# Patient Record
Sex: Female | Born: 1945
Health system: Southern US, Community
[De-identification: ages and names within clinical notes are randomized; demographics above are authoritative.]

## PROBLEM LIST (undated history)

## (undated) DIAGNOSIS — I10 Essential (primary) hypertension: Secondary | ICD-10-CM

## (undated) DIAGNOSIS — K5790 Diverticulosis of intestine, part unspecified, without perforation or abscess without bleeding: Secondary | ICD-10-CM

## (undated) DIAGNOSIS — D126 Benign neoplasm of colon, unspecified: Secondary | ICD-10-CM

## (undated) DIAGNOSIS — R011 Cardiac murmur, unspecified: Secondary | ICD-10-CM

## (undated) DIAGNOSIS — K635 Polyp of colon: Secondary | ICD-10-CM

## (undated) DIAGNOSIS — B9681 Helicobacter pylori [H. pylori] as the cause of diseases classified elsewhere: Secondary | ICD-10-CM

## (undated) DIAGNOSIS — K449 Diaphragmatic hernia without obstruction or gangrene: Secondary | ICD-10-CM

## (undated) DIAGNOSIS — R000 Tachycardia, unspecified: Secondary | ICD-10-CM

## (undated) DIAGNOSIS — E859 Amyloidosis, unspecified: Secondary | ICD-10-CM

## (undated) DIAGNOSIS — E079 Disorder of thyroid, unspecified: Secondary | ICD-10-CM

## (undated) DIAGNOSIS — H269 Unspecified cataract: Secondary | ICD-10-CM

## (undated) DIAGNOSIS — H9190 Unspecified hearing loss, unspecified ear: Secondary | ICD-10-CM

## (undated) DIAGNOSIS — M1990 Unspecified osteoarthritis, unspecified site: Secondary | ICD-10-CM

## (undated) DIAGNOSIS — E785 Hyperlipidemia, unspecified: Secondary | ICD-10-CM

## (undated) DIAGNOSIS — K219 Gastro-esophageal reflux disease without esophagitis: Secondary | ICD-10-CM

## (undated) DIAGNOSIS — E039 Hypothyroidism, unspecified: Secondary | ICD-10-CM

## (undated) DIAGNOSIS — E119 Type 2 diabetes mellitus without complications: Secondary | ICD-10-CM

## (undated) DIAGNOSIS — K8020 Calculus of gallbladder without cholecystitis without obstruction: Secondary | ICD-10-CM

## (undated) HISTORY — DX: Essential (primary) hypertension: I10

## (undated) HISTORY — DX: Unspecified cataract: H26.9

## (undated) HISTORY — DX: Hyperlipidemia, unspecified: E78.5

## (undated) HISTORY — DX: Diaphragmatic hernia without obstruction or gangrene: K44.9

## (undated) HISTORY — DX: Type 2 diabetes mellitus without complications: E11.9

## (undated) HISTORY — DX: Gastro-esophageal reflux disease without esophagitis: K21.9

## (undated) HISTORY — DX: Hypothyroidism, unspecified: E03.9

## (undated) HISTORY — PX: APPENDECTOMY: SHX54

## (undated) HISTORY — DX: Benign neoplasm of colon, unspecified: D12.6

## (undated) HISTORY — DX: Unspecified osteoarthritis, unspecified site: M19.90

## (undated) HISTORY — DX: Helicobacter pylori (H. pylori) as the cause of diseases classified elsewhere: B96.81

## (undated) HISTORY — DX: Tachycardia, unspecified: R00.0

## (undated) HISTORY — DX: Cardiac murmur, unspecified: R01.1

## (undated) HISTORY — DX: Calculus of gallbladder without cholecystitis without obstruction: K80.20

## (undated) HISTORY — DX: Disorder of thyroid, unspecified: E07.9

## (undated) HISTORY — PX: TONSILLECTOMY: SUR1361

## (undated) HISTORY — DX: Unspecified hearing loss, unspecified ear: H91.90

## (undated) HISTORY — DX: Diverticulosis of intestine, part unspecified, without perforation or abscess without bleeding: K57.90

## (undated) HISTORY — PX: CHOLECYSTECTOMY: SHX55

## (undated) HISTORY — DX: Polyp of colon: K63.5

## (undated) HISTORY — DX: Amyloidosis, unspecified: E85.9

## (undated) HISTORY — PX: ABDOMINAL HYSTERECTOMY: SHX81

## (undated) HISTORY — PX: COLONOSCOPY: SHX174

---

## 1998-10-31 ENCOUNTER — Ambulatory Visit (HOSPITAL_COMMUNITY): Admission: RE | Admit: 1998-10-31 | Discharge: 1998-10-31 | Payer: Self-pay | Admitting: Otolaryngology

## 1998-10-31 ENCOUNTER — Encounter: Payer: Self-pay | Admitting: Otolaryngology

## 1998-12-29 ENCOUNTER — Encounter (HOSPITAL_COMMUNITY): Admission: RE | Admit: 1998-12-29 | Discharge: 1999-03-29 | Payer: Self-pay | Admitting: Otolaryngology

## 1999-01-02 ENCOUNTER — Emergency Department (HOSPITAL_COMMUNITY): Admission: EM | Admit: 1999-01-02 | Discharge: 1999-01-02 | Payer: Self-pay | Admitting: Emergency Medicine

## 1999-03-19 ENCOUNTER — Ambulatory Visit (HOSPITAL_COMMUNITY): Admission: RE | Admit: 1999-03-19 | Discharge: 1999-03-19 | Payer: Self-pay | Admitting: Neurology

## 1999-03-19 ENCOUNTER — Encounter: Payer: Self-pay | Admitting: Neurology

## 2002-04-30 ENCOUNTER — Encounter: Admission: RE | Admit: 2002-04-30 | Discharge: 2002-06-26 | Payer: Self-pay | Admitting: Endocrinology

## 2004-07-28 ENCOUNTER — Encounter (INDEPENDENT_AMBULATORY_CARE_PROVIDER_SITE_OTHER): Payer: Self-pay | Admitting: *Deleted

## 2004-07-28 ENCOUNTER — Inpatient Hospital Stay (HOSPITAL_COMMUNITY): Admission: RE | Admit: 2004-07-28 | Discharge: 2004-07-30 | Payer: Self-pay | Admitting: Obstetrics and Gynecology

## 2004-07-28 ENCOUNTER — Encounter (INDEPENDENT_AMBULATORY_CARE_PROVIDER_SITE_OTHER): Payer: Self-pay | Admitting: Specialist

## 2006-06-23 ENCOUNTER — Encounter: Admission: RE | Admit: 2006-06-23 | Discharge: 2006-06-23 | Payer: Self-pay | Admitting: Obstetrics and Gynecology

## 2006-08-02 ENCOUNTER — Ambulatory Visit: Payer: Self-pay | Admitting: Internal Medicine

## 2007-06-28 ENCOUNTER — Encounter: Admission: RE | Admit: 2007-06-28 | Discharge: 2007-06-28 | Payer: Self-pay | Admitting: Obstetrics and Gynecology

## 2007-06-30 ENCOUNTER — Encounter: Admission: RE | Admit: 2007-06-30 | Discharge: 2007-06-30 | Payer: Self-pay | Admitting: Obstetrics and Gynecology

## 2008-06-28 ENCOUNTER — Encounter: Admission: RE | Admit: 2008-06-28 | Discharge: 2008-06-28 | Payer: Self-pay | Admitting: Obstetrics and Gynecology

## 2008-12-04 ENCOUNTER — Encounter: Admission: RE | Admit: 2008-12-04 | Discharge: 2008-12-04 | Payer: Self-pay | Admitting: Obstetrics and Gynecology

## 2009-05-22 DIAGNOSIS — E039 Hypothyroidism, unspecified: Secondary | ICD-10-CM | POA: Insufficient documentation

## 2009-05-31 ENCOUNTER — Inpatient Hospital Stay (HOSPITAL_COMMUNITY): Admission: EM | Admit: 2009-05-31 | Discharge: 2009-06-02 | Payer: Self-pay | Admitting: Emergency Medicine

## 2009-06-01 ENCOUNTER — Encounter (INDEPENDENT_AMBULATORY_CARE_PROVIDER_SITE_OTHER): Payer: Self-pay | Admitting: Cardiovascular Disease

## 2009-07-02 ENCOUNTER — Encounter: Admission: RE | Admit: 2009-07-02 | Discharge: 2009-07-02 | Payer: Self-pay | Admitting: Obstetrics and Gynecology

## 2009-08-11 DIAGNOSIS — E559 Vitamin D deficiency, unspecified: Secondary | ICD-10-CM | POA: Insufficient documentation

## 2010-07-07 ENCOUNTER — Encounter: Admission: RE | Admit: 2010-07-07 | Discharge: 2010-07-07 | Payer: Self-pay | Admitting: Obstetrics and Gynecology

## 2010-10-04 ENCOUNTER — Encounter: Payer: Self-pay | Admitting: Obstetrics and Gynecology

## 2010-12-18 LAB — LIPID PANEL
HDL: 62 mg/dL
Total CHOL/HDL Ratio: 3.3 ratio
Triglycerides: 159 mg/dL — ABNORMAL HIGH
VLDL: 32 mg/dL (ref 0–40)

## 2010-12-18 LAB — BASIC METABOLIC PANEL
BUN: 11 mg/dL (ref 6–23)
CO2: 24 mEq/L (ref 19–32)
Chloride: 107 mEq/L (ref 96–112)
GFR calc Af Amer: >60 mL/min (ref >60)
Glucose, Bld: 165 mg/dL — ABNORMAL HIGH (ref 70–99)
Potassium: 3.9 mEq/L (ref 3.5–5.1)

## 2010-12-18 LAB — POCT CARDIAC MARKERS
CKMB, poc: 1.4 ng/mL (ref 1.0–8.0)
Myoglobin, poc: 61 ng/mL (ref 12–200)
Troponin i, poc: <0.05 ng/mL (ref 0.00–0.09)

## 2010-12-18 LAB — COMPREHENSIVE METABOLIC PANEL
AST: 26 U/L (ref 0–37)
Alkaline Phosphatase: 76 U/L (ref 39–117)
Calcium: 8.9 mg/dL (ref 8.4–10.5)
Total Protein: 6.5 g/dL (ref 6.0–8.3)

## 2010-12-18 LAB — CBC
HCT: 35.6 % — ABNORMAL LOW (ref 36.0–46.0)
HCT: 35.7 % — ABNORMAL LOW (ref 36.0–46.0)
HCT: 38.4 % (ref 36.0–46.0)
HCT: 39.4 % (ref 36.0–46.0)
Hemoglobin: 12 g/dL (ref 12.0–15.0)
Hemoglobin: 12.8 g/dL (ref 12.0–15.0)
Hemoglobin: 13.1 g/dL (ref 12.0–15.0)
MCHC: 33.3 g/dL (ref 30.0–36.0)
MCHC: 33.7 g/dL (ref 30.0–36.0)
MCV: 86.1 fL (ref 78.0–100.0)
MCV: 86.4 fL (ref 78.0–100.0)
MCV: 86.7 fL (ref 78.0–100.0)
Platelets: 285 10*3/uL (ref 150–400)
Platelets: 291 10*3/uL (ref 150–400)
Platelets: 310 10*3/uL (ref 150–400)
RBC: 4.13 MIL/uL (ref 3.87–5.11)
RBC: 4.46 MIL/uL (ref 3.87–5.11)
RBC: 4.54 MIL/uL (ref 3.87–5.11)
RDW: 15.4 % (ref 11.5–15.5)
RDW: 15.5 % (ref 11.5–15.5)
RDW: 15.8 % — ABNORMAL HIGH (ref 11.5–15.5)
RDW: 15.9 % — ABNORMAL HIGH (ref 11.5–15.5)
WBC: 11 10*3/uL — ABNORMAL HIGH (ref 4.0–10.5)
WBC: 11.1 10*3/uL — ABNORMAL HIGH (ref 4.0–10.5)

## 2010-12-18 LAB — BRAIN NATRIURETIC PEPTIDE: Pro B Natriuretic peptide (BNP): 38 pg/mL (ref 0.0–100.0)

## 2010-12-18 LAB — DIFFERENTIAL
Eosinophils Absolute: 0.1 10*3/uL (ref 0.0–0.7)
Eosinophils Relative: 1 % (ref 0–5)
Lymphocytes Relative: 31 % (ref 12–46)
Lymphs Abs: 3.4 10*3/uL (ref 0.7–4.0)
Monocytes Absolute: 0.7 10*3/uL (ref 0.1–1.0)
Monocytes Relative: 6 % (ref 3–12)
Neutro Abs: 6.9 10*3/uL (ref 1.7–7.7)
Neutrophils Relative %: 62 % (ref 43–77)

## 2010-12-18 LAB — CARDIAC PANEL(CRET KIN+CKTOT+MB+TROPI)
CK, MB: 2.2 ng/mL (ref 0.3–4.0)
CK, MB: 2.6 ng/mL (ref 0.3–4.0)
Relative Index: 2 (ref 0.0–2.5)
Relative Index: 2.1 (ref 0.0–2.5)
Relative Index: INVALID (ref 0.0–2.5)
Total CK: 117 U/L (ref 7–177)
Total CK: 99 U/L (ref 7–177)

## 2010-12-18 LAB — HEMOGLOBIN A1C: Mean Plasma Glucose: 174 mg/dL

## 2010-12-18 LAB — LIPASE, BLOOD: Lipase: 101 U/L — ABNORMAL HIGH (ref 11–59)

## 2010-12-18 LAB — POCT I-STAT, CHEM 8
Chloride: 106 mEq/L (ref 96–112)
TCO2: 25 mmol/L (ref 0–100)

## 2010-12-18 LAB — GLUCOSE, CAPILLARY
Glucose-Capillary: 134 mg/dL — ABNORMAL HIGH (ref 70–99)
Glucose-Capillary: 170 mg/dL — ABNORMAL HIGH (ref 70–99)

## 2010-12-18 LAB — TSH: TSH: 1.697 u[IU]/mL (ref 0.350–4.500)

## 2010-12-18 LAB — HEPARIN LEVEL (UNFRACTIONATED): Heparin Unfractionated: 0.29 IU/mL — ABNORMAL LOW (ref 0.30–0.70)

## 2010-12-18 LAB — PROTIME-INR: INR: 0.9 (ref 0.00–1.49)

## 2011-01-29 NOTE — Assessment & Plan Note (Signed)
[facility]                           GASTROENTEROLOGY OFFICE NOTE   KARLY, PITTER                         MRN:          [id number]  DATE:08/02/2006                            DOB:          [birth year]    Miss Deschepper is an 65 year old white female who has new onset constipation  which started before she went on Weight Watchers diet where she lost at 50  pounds. She continues to lose weight and continues to be constipated. We saw  her in June 2005, for screening colonoscopy. At that time, her examination  was normal. She had fatty ileocecal valve which was biopsied and was normal.  Next colonoscopy was scheduled for 10 years from 2005. She now is  constipated, having to take stool softener 1 a day which causes her to have  1 bowel movement a day. She is also on a high fiber diet and fiber  supplements. She denies any abdominal pain. She had some difficulty with  evacuation of the stool and actually her tailbone was hurting recently from  sitting at the bedside while her son was having surgery at [facility].   PHYSICAL EXAMINATION:  Blood pressure is 120/62, pulse is 82 and weight is  232 pounds. Prior weight was 270 pounds. She was alert, oriented, in no  distress.  ABDOMEN: Showed skin furuncle on right lower quadrant. Soft, nontender with  normoactive bowel sounds.  RECTAL: There was small external hemorrhoids. Normal anal canal with normal  rectal tone. Moderate amount of __________ . Hemoccult negative stool in the  ampulla. There was no evidence of fissure.   IMPRESSION:  31. A  65 year old white female with functional constipation due to weight      loss and dieting. She had a normal colonoscopy a year and a half ago.      No need to repeat the colon examination.  2. Small external hemorrhoids aggravated by the patient straining.   PLAN:  1. Continue stool softener 1 a day.  2. As an alternative, we discussed use of prunes 2-3 a day or  prune juice.      The patient liked the idea of the prunes because they are natural      laxatives.  3. Prescription for MiraLax given as a last resort 17 grams several times      a week as needed for constipation. I encouraged her to continue to eat      the high fiber diet.     Hedwig Morton. Juanda Chance, MD  Electronically Signed    DMB/MedQ  DD: 08/02/2006  DT: 08/02/2006  Job #: [id number]   cc:   Tera Mater. Evlyn Kanner, M.D.

## 2011-01-29 NOTE — Op Note (Signed)
Pamela [name], Pamela [name]                  ACCOUNT NO.:  [id number]   MEDICAL RECORD NO.:  [id number]          PATIENT TYPE:  INP   LOCATION:  0007                         FACILITY:  [facility]   PHYSICIAN:  Katherine Roan, M.D.  DATE OF BIRTH:  1945/11/07   DATE OF PROCEDURE:  07/28/2004  DATE OF DISCHARGE:                                 OPERATIVE REPORT   SURGEON:  Dr. Elana Alm   ASSISTANT:  Dr. Greta Doom   PREOPERATIVE DIAGNOSIS:  Pelvic mass, status post hysterectomy.   POSTOPERATIVE DIAGNOSIS:  Pelvic mass, probably benign.   OPERATIONS PERFORMED:  1.  Pelvic exam under anesthesia.  2.  Exploratory laparotomy.  3.  Lysis of adhesions.  4.  Excision of pelvic and paravaginal cysts.  5.  Vaginotomy and closure.  6.  Ureterolysis.   The patient was placed in lithotomy position.  Examination under anesthesia  confirmed the right-sided pelvic mass.  It was just beside the vagina.  Foley catheter was inserted, and the midline was opened from the previous  surgery.  Hemostasis was accomplished with a Bovie.  The fascia was entered  vertically.  Peritoneum entered vertically, and adhesions from the omentum  to the lower abdominal wall were lysed with sharp dissection.  We then  placed her in a deep Trendelenburg position and were able to free up the  adhesions in the pelvis and free up the pelvic mass which was unattached to  the infundibulopelvic ligament, and it was beside the vagina.  Careful  dissection right on the cyst wall was done.  We actually got in the cyst  wall.  The fluid was clear.  It was a unilocular cyst.  We then freed up the  rest of the cyst using hemoclips for hemostasis.  Following this, we were  able to obtain hemostasis.  In getting into the cyst, we did get into the  vagina.  The vaginotomy was then closed with interrupted sutures of 0 PDS,  and I was able to fashion a closure of the vagina that I thought would help  prevent an enterocele.  Hemostasis was secure  except in the right angle of  the vagina, and we were able to use clips and sutures for arrest of the  bleeding.  We put Surgicel around this area when we finished.  Prior to  placing the Surgicel in the pelvis, I opened up the peritoneum and  visualized the ureter.  It was not dilated, and we traced it down to where  we had done the surgery and felt like it was out of harm's way.  The colon  was likewise free from any serosal injury.  Following this, we irrigated  copiously then placed the Surgicel in and then closed the midline peritoneum  with 2-0 PDS, following which we closed the fascia with interrupted sutures  figure-of-eight of #1 Novofil.  Eight knots were placed on the Novofil for  hemostasis.  The subcutaneum was then closed with 2-0 plain continuous suture, and there  were three nylon sutures placed in the skin, and the skin was closed with  clips in addition to the three nylon sutures.  I then infiltrated the  incision with 0.5% Marcaine with epinephrine.  Following this, she was taken  to the recovery room.      SDM/MEDQ  D:  07/28/2004  T:  07/28/2004  Job:  [id number]   cc:   Luvenia Redden, M.D.  [address]  [place]  [place] [address]  Fax: [phone]

## 2011-01-29 NOTE — H&P (Signed)
[name], [name]                  ACCOUNT NO.:  [id number]   MEDICAL RECORD NO.:  [id number]          PATIENT TYPE:  INP   LOCATION:  NA                           FACILITY:  [facility]   PHYSICIAN:  Katherine Roan, M.D.  DATE OF BIRTH:  20-Jul-1946   DATE OF ADMISSION:  07/28/2004  DATE OF DISCHARGE:                                HISTORY & PHYSICAL   CHIEF COMPLAINT:  Pelvic mass.   This is a 65 year old gravida 1 female status post TAH/BSO with a cystic  mass beside the right vaginal fornix.  She is asymptomatic.  Her medication  comorbidities include hypothyroidism, elevated cholesterol, diabetes, and  obesity.  She has been seen by Dr. Ardyth Harps.  Dr. Lina Sar did a  colonoscopy on her in June.  Her CA-125 is normal.   She is allergic to SULFA, DEMEROL, and MECLIZINE.   She has a history of gallbladder surgery, hysterectomy in 1996.  Records  have been reviewed at that time, which revealed both tubes and ovaries have  been removed.  The right ovary had a large dermoid on it.  This mass is on  the right side of the pelvis.  It appears to be a simple cyst.   REVIEW OF SYSTEMS:  She wear glasses for reading.  She has no decrease in  visual or auditory acuity.  No dizziness.  She is hypertensive, and this is  controlled with lisinopril.  She has no history of dizziness.  No history of  heart murmur.  No chest pain.  LUNGS:  No chronic cough.  GU:  She has no  stress incontinence.  GI:  She has had a history of irritable bowel syndrome  and recently saw Dr. Juanda Chance, for which she gave some hyoscyamine.  She has  no history of arthritis.   FAMILY HISTORY:  Her mother is deceased at age 20 of a cerebral aneurysm.  Her father is deceased.  Was diabetic and had Alzheimer's.  She has one  brother with hypertension.  She has a maternal grandmother with breast  cancer, and a paternal uncle with lung cancer.  Her father, maternal aunt,  and paternal uncle were all diabetic.   PHYSICAL  EXAMINATION:  VITAL SIGNS:  Weight 257.  Blood pressure 130/70.  GENERAL:  A pleasant, well-developed, well-nourished female who appears to  be her stated age.  She is oriented to time, place, and recent events.  HEENT:  Examination of the ears, nose, and throat are unremarkable.  Oropharynx is not injected.  NECK:  Supple.  Carotid pulses are equal without bruits.  No adenopathy  appreciated.  LUNGS:  Clear.  BREASTS:  No masses or tenderness.  HEART:  Normal sinus rhythm.  No murmurs.  ABDOMEN:  Obese.  Liver, spleen, and kidneys are not palpated.  EXTREMITIES:  Trace edema.  Good range of motion.  PELVIC:  A cystic mass on the right side of the vagina.  It appears to be a  simple cyst.  It is nonmoveable.  Rectovaginal confirms.  Hemoccult  negative.   IMPRESSION:  Probable  inclusion cyst in the vagina, rule out ovarian  remnant, which I doubt.   We have discussed the risks and benefits with Ms. Kulig, and she has been  appraised that the risks could be infection, hemorrhage, damage to bowel or  bladder.  She is on a bowel prep.      SDM/MEDQ  D:  07/23/2004  T:  07/23/2004  Job:  [id number]

## 2011-01-29 NOTE — Discharge Summary (Signed)
Pamela [name], Pamela [name]                  ACCOUNT NO.:  [id number]   MEDICAL RECORD NO.:  [id number]          PATIENT TYPE:  INP   LOCATION:  0440                         FACILITY:  [facility]   PHYSICIAN:  Katherine Roan, M.D.  DATE OF BIRTH:  07-09-1946   DATE OF ADMISSION:  07/28/2004  DATE OF DISCHARGE:  07/30/2004                                 DISCHARGE SUMMARY   ADMISSION DIAGNOSIS:  Pelvic mass.   DISCHARGE DIAGNOSES:  1.  Pelvic mass.  2.  Type 2 diabetes.  3.  Hypertension.  4.  History of hypothyroidism.  5.  Obesity.   HISTORY OF PRESENT ILLNESS:  Ms. Kolodziej is a 65 year old female who was  admitted to the hospital for a pelvic mass.  She is status post TAH/BSO.  The cystic mass was felt to be complex and was paravaginal.   LABORATORY STUDIES:  Admission hemoglobin was 13.  Urinalysis was  unremarkable.  Glucose was 161.  Creatinine was 0.6.   Cardiogram was normal.   HOSPITAL COURSE:  The patient was admitted to the hospital and underwent an  exploratory laparotomy with lysis of adhesions and excision of a paravaginal  mass.  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  At any rate, the surgery was without  incidence and she was discharged on the 17th of November, doing very well;  she was tolerating a diet and had some vaginal bleeding.  We discussed with  her prior to discharge that the diagnosis of a paravaginal cyst was benign.   FOLLOWUP:  She is to return to the office in approximately 3 days to have  her staples removed and call as needed for bleeding.   CONDITION ON DISCHARGE:  Condition on discharge is improved.      SDM/MEDQ  D:  08/20/2004  T:  08/21/2004  Job:  [id number]   cc:   Barry Dienes. Eloise Harman, M.D.  [address]  [place]  [place] [address]  Fax: [phone]

## 2011-06-07 ENCOUNTER — Other Ambulatory Visit: Payer: Self-pay | Admitting: Obstetrics and Gynecology

## 2011-06-07 DIAGNOSIS — Z1231 Encounter for screening mammogram for malignant neoplasm of breast: Secondary | ICD-10-CM

## 2011-06-08 ENCOUNTER — Other Ambulatory Visit: Payer: Self-pay | Admitting: Obstetrics and Gynecology

## 2011-06-08 DIAGNOSIS — Z780 Asymptomatic menopausal state: Secondary | ICD-10-CM

## 2011-07-09 ENCOUNTER — Ambulatory Visit
Admission: RE | Admit: 2011-07-09 | Discharge: 2011-07-09 | Disposition: A | Discharge status: Home / Self Care | Payer: Medicare Other | Source: Ambulatory Visit | Attending: Obstetrics and Gynecology | Admitting: Obstetrics and Gynecology

## 2011-07-09 DIAGNOSIS — Z780 Asymptomatic menopausal state: Secondary | ICD-10-CM

## 2011-07-09 DIAGNOSIS — Z1231 Encounter for screening mammogram for malignant neoplasm of breast: Secondary | ICD-10-CM

## 2011-10-26 DIAGNOSIS — E039 Hypothyroidism, unspecified: Secondary | ICD-10-CM | POA: Diagnosis not present

## 2011-10-26 DIAGNOSIS — E559 Vitamin D deficiency, unspecified: Secondary | ICD-10-CM | POA: Diagnosis not present

## 2011-10-26 DIAGNOSIS — I10 Essential (primary) hypertension: Secondary | ICD-10-CM | POA: Diagnosis not present

## 2011-10-26 DIAGNOSIS — H8110 Benign paroxysmal vertigo, unspecified ear: Secondary | ICD-10-CM | POA: Insufficient documentation

## 2011-10-27 DIAGNOSIS — I69998 Other sequelae following unspecified cerebrovascular disease: Secondary | ICD-10-CM | POA: Diagnosis not present

## 2011-10-27 DIAGNOSIS — R42 Dizziness and giddiness: Secondary | ICD-10-CM | POA: Diagnosis not present

## 2011-11-02 DIAGNOSIS — R42 Dizziness and giddiness: Secondary | ICD-10-CM | POA: Diagnosis not present

## 2011-11-02 DIAGNOSIS — I69998 Other sequelae following unspecified cerebrovascular disease: Secondary | ICD-10-CM | POA: Diagnosis not present

## 2011-11-05 DIAGNOSIS — I69998 Other sequelae following unspecified cerebrovascular disease: Secondary | ICD-10-CM | POA: Diagnosis not present

## 2011-11-10 DIAGNOSIS — I69998 Other sequelae following unspecified cerebrovascular disease: Secondary | ICD-10-CM | POA: Diagnosis not present

## 2011-11-10 DIAGNOSIS — R42 Dizziness and giddiness: Secondary | ICD-10-CM | POA: Diagnosis not present

## 2011-12-03 DIAGNOSIS — R42 Dizziness and giddiness: Secondary | ICD-10-CM | POA: Diagnosis not present

## 2011-12-03 DIAGNOSIS — I69998 Other sequelae following unspecified cerebrovascular disease: Secondary | ICD-10-CM | POA: Diagnosis not present

## 2011-12-24 DIAGNOSIS — R42 Dizziness and giddiness: Secondary | ICD-10-CM | POA: Diagnosis not present

## 2011-12-24 DIAGNOSIS — E785 Hyperlipidemia, unspecified: Secondary | ICD-10-CM | POA: Diagnosis not present

## 2011-12-24 DIAGNOSIS — E559 Vitamin D deficiency, unspecified: Secondary | ICD-10-CM | POA: Diagnosis not present

## 2011-12-24 DIAGNOSIS — E119 Type 2 diabetes mellitus without complications: Secondary | ICD-10-CM | POA: Diagnosis not present

## 2012-01-06 DIAGNOSIS — I69998 Other sequelae following unspecified cerebrovascular disease: Secondary | ICD-10-CM | POA: Diagnosis not present

## 2012-01-06 DIAGNOSIS — R42 Dizziness and giddiness: Secondary | ICD-10-CM | POA: Diagnosis not present

## 2012-01-31 DIAGNOSIS — R42 Dizziness and giddiness: Secondary | ICD-10-CM | POA: Diagnosis not present

## 2012-01-31 DIAGNOSIS — E782 Mixed hyperlipidemia: Secondary | ICD-10-CM | POA: Diagnosis not present

## 2012-01-31 DIAGNOSIS — E119 Type 2 diabetes mellitus without complications: Secondary | ICD-10-CM | POA: Diagnosis not present

## 2012-02-02 DIAGNOSIS — H35369 Drusen (degenerative) of macula, unspecified eye: Secondary | ICD-10-CM | POA: Diagnosis not present

## 2012-02-02 DIAGNOSIS — E119 Type 2 diabetes mellitus without complications: Secondary | ICD-10-CM | POA: Diagnosis not present

## 2012-02-02 DIAGNOSIS — H2510 Age-related nuclear cataract, unspecified eye: Secondary | ICD-10-CM | POA: Diagnosis not present

## 2012-02-02 DIAGNOSIS — H538 Other visual disturbances: Secondary | ICD-10-CM | POA: Diagnosis not present

## 2012-02-02 DIAGNOSIS — H02829 Cysts of unspecified eye, unspecified eyelid: Secondary | ICD-10-CM | POA: Diagnosis not present

## 2012-02-02 DIAGNOSIS — H524 Presbyopia: Secondary | ICD-10-CM | POA: Diagnosis not present

## 2012-05-02 DIAGNOSIS — H905 Unspecified sensorineural hearing loss: Secondary | ICD-10-CM | POA: Diagnosis not present

## 2012-05-05 DIAGNOSIS — E559 Vitamin D deficiency, unspecified: Secondary | ICD-10-CM | POA: Diagnosis not present

## 2012-05-05 DIAGNOSIS — I7300 Raynaud's syndrome without gangrene: Secondary | ICD-10-CM | POA: Insufficient documentation

## 2012-05-05 DIAGNOSIS — E119 Type 2 diabetes mellitus without complications: Secondary | ICD-10-CM | POA: Diagnosis not present

## 2012-05-05 DIAGNOSIS — R252 Cramp and spasm: Secondary | ICD-10-CM | POA: Diagnosis not present

## 2012-05-29 ENCOUNTER — Other Ambulatory Visit: Payer: Self-pay | Admitting: Obstetrics and Gynecology

## 2012-05-29 DIAGNOSIS — Z1231 Encounter for screening mammogram for malignant neoplasm of breast: Secondary | ICD-10-CM

## 2012-07-10 ENCOUNTER — Ambulatory Visit
Admission: RE | Admit: 2012-07-10 | Discharge: 2012-07-10 | Disposition: A | Discharge status: Home / Self Care | Payer: Medicare Other | Source: Ambulatory Visit | Attending: Obstetrics and Gynecology | Admitting: Obstetrics and Gynecology

## 2012-07-10 DIAGNOSIS — Z1231 Encounter for screening mammogram for malignant neoplasm of breast: Secondary | ICD-10-CM

## 2012-08-28 DIAGNOSIS — I7300 Raynaud's syndrome without gangrene: Secondary | ICD-10-CM | POA: Diagnosis not present

## 2012-08-28 DIAGNOSIS — E559 Vitamin D deficiency, unspecified: Secondary | ICD-10-CM | POA: Diagnosis not present

## 2012-08-28 DIAGNOSIS — E119 Type 2 diabetes mellitus without complications: Secondary | ICD-10-CM | POA: Diagnosis not present

## 2012-08-28 DIAGNOSIS — L84 Corns and callosities: Secondary | ICD-10-CM | POA: Diagnosis not present

## 2012-11-23 DIAGNOSIS — E782 Mixed hyperlipidemia: Secondary | ICD-10-CM | POA: Diagnosis not present

## 2012-11-23 DIAGNOSIS — I119 Hypertensive heart disease without heart failure: Secondary | ICD-10-CM | POA: Diagnosis not present

## 2012-11-23 DIAGNOSIS — IMO0001 Reserved for inherently not codable concepts without codable children: Secondary | ICD-10-CM | POA: Diagnosis not present

## 2012-11-23 DIAGNOSIS — R0789 Other chest pain: Secondary | ICD-10-CM | POA: Diagnosis not present

## 2012-11-24 ENCOUNTER — Other Ambulatory Visit (HOSPITAL_COMMUNITY): Payer: Self-pay | Admitting: Cardiovascular Disease

## 2012-11-24 DIAGNOSIS — R0789 Other chest pain: Secondary | ICD-10-CM

## 2012-12-04 DIAGNOSIS — E559 Vitamin D deficiency, unspecified: Secondary | ICD-10-CM | POA: Diagnosis not present

## 2012-12-04 DIAGNOSIS — E039 Hypothyroidism, unspecified: Secondary | ICD-10-CM | POA: Diagnosis not present

## 2012-12-04 DIAGNOSIS — I10 Essential (primary) hypertension: Secondary | ICD-10-CM | POA: Diagnosis not present

## 2012-12-04 DIAGNOSIS — E785 Hyperlipidemia, unspecified: Secondary | ICD-10-CM | POA: Diagnosis not present

## 2012-12-04 DIAGNOSIS — E119 Type 2 diabetes mellitus without complications: Secondary | ICD-10-CM | POA: Diagnosis not present

## 2012-12-07 ENCOUNTER — Ambulatory Visit (HOSPITAL_COMMUNITY)
Admission: RE | Admit: 2012-12-07 | Discharge: 2012-12-07 | Disposition: A | Discharge status: Home / Self Care | Payer: Medicare Other | Source: Ambulatory Visit | Attending: Cardiovascular Disease | Admitting: Cardiovascular Disease

## 2012-12-07 DIAGNOSIS — IMO0001 Reserved for inherently not codable concepts without codable children: Secondary | ICD-10-CM | POA: Diagnosis not present

## 2012-12-07 DIAGNOSIS — I10 Essential (primary) hypertension: Secondary | ICD-10-CM | POA: Insufficient documentation

## 2012-12-07 DIAGNOSIS — R0609 Other forms of dyspnea: Secondary | ICD-10-CM | POA: Insufficient documentation

## 2012-12-07 DIAGNOSIS — E119 Type 2 diabetes mellitus without complications: Secondary | ICD-10-CM | POA: Diagnosis not present

## 2012-12-07 DIAGNOSIS — I119 Hypertensive heart disease without heart failure: Secondary | ICD-10-CM | POA: Diagnosis not present

## 2012-12-07 DIAGNOSIS — R0789 Other chest pain: Secondary | ICD-10-CM

## 2012-12-07 DIAGNOSIS — R5381 Other malaise: Secondary | ICD-10-CM | POA: Diagnosis not present

## 2012-12-07 DIAGNOSIS — R42 Dizziness and giddiness: Secondary | ICD-10-CM | POA: Diagnosis not present

## 2012-12-07 DIAGNOSIS — R079 Chest pain, unspecified: Secondary | ICD-10-CM | POA: Insufficient documentation

## 2012-12-07 DIAGNOSIS — E782 Mixed hyperlipidemia: Secondary | ICD-10-CM | POA: Diagnosis not present

## 2012-12-07 DIAGNOSIS — R0989 Other specified symptoms and signs involving the circulatory and respiratory systems: Secondary | ICD-10-CM | POA: Insufficient documentation

## 2012-12-07 MED ORDER — REGADENOSON 0.4 MG/5ML IV SOLN
0.4000 mg | Freq: Once | INTRAVENOUS | Status: AC
  Administered 2012-12-07: 0.4 mg via INTRAVENOUS

## 2012-12-07 MED ORDER — TECHNETIUM TC 99M SESTAMIBI GENERIC - CARDIOLITE
10.0000 | Freq: Once | INTRAVENOUS | Status: AC | PRN
  Administered 2012-12-07: 10 via INTRAVENOUS

## 2012-12-07 MED ORDER — TECHNETIUM TC 99M SESTAMIBI GENERIC - CARDIOLITE
30.0000 | Freq: Once | INTRAVENOUS | Status: AC | PRN
  Administered 2012-12-07: 30 via INTRAVENOUS

## 2012-12-07 NOTE — Procedures (Addendum)
[facility] [address] [place] [address] [phone]  Cardiology Nuclear Med Study  ARIBELLE MCCOSH is a 67 y.o. female     MRN : [id number]     DOB: [birth year]  Procedure Date: 12/07/2012  Nuclear Med Background Indication for Stress Test:  Evaluation for Ischemia History:  PTCA Cardiac Risk Factors: Hypertension, Lipids, NIDDM and Obesity  Symptoms:  Chest Pain, Dizziness, Fatigue and Light-Headedness   Nuclear Pre-Procedure Caffeine/Decaff Intake:  7:00pm NPO After: 5:00am   IV Site: R Forearm  IV 0.9% NS with Angio Cath:  22g  Chest Size (in):  N/A IV Started by: Emmit Pomfret, RN  Height: 5\' 8"  (1.727 m)  Cup Size: DD  BMI:  Body mass index is 39.09 kg/(m^2). Weight:  257 lb (116.574 kg)   Tech Comments:  N/A    Nuclear Med Study 1 or 2 day study: 1 day  Stress Test Type:  Lexiscan  Order Authorizing Provider:  Nicki Guadalajara, MD    Resting Radionuclide: Technetium 60m Sestamibi  Resting Radionuclide Dose: 10.3 mCi   Stress Radionuclide:  Technetium 40m Sestamibi  Stress Radionuclide Dose: 31.1 mCi           Stress Protocol Rest HR: 69 Stress HR: 93  Rest BP: 144/63 Stress BP: 171/63  Exercise Time (min): n/a METS: n/a   Predicted Max HR: 153 bpm % Max HR: 60.78 bpm Rate Pressure Product: 21308  Dose of Adenosine (mg):  n/a Dose of Lexiscan: 0.4 mg  Dose of Atropine (mg): n/a Dose of Dobutamine: n/a mcg/kg/min (at max HR)  Stress Test Technologist: Esperanza Sheets, CCT Nuclear Technologist: Koren Shiver, CNMT   Rest Procedure:  Myocardial perfusion imaging was performed at rest 45 minutes following the intravenous administration of Technetium 39m Sestamibi. Stress Procedure:  The patient received IV Lexiscan 0.4 mg over 15-seconds.  Technetium 61m Sestamibi injected at 30-seconds.  There were no significant changes with Lexiscan.  Quantitative spect images were obtained after a 45 minute  delay.  Transient Ischemic Dilatation (Normal <1.22):  0.99 Lung/Heart Ratio (Normal <0.45):  0.33 QGS EDV:  73 ml QGS ESV:  19 ml LV Ejection Fraction: 74%  Signed by:  Koren Shiver, CNMT  PHYSICIAN INTERPRETATION:  Rest ECG: NSR - Normal EKG  Stress ECG: No significant change from baseline ECG  QPS Raw Data Images:  Mild breast attenuation.  Normal left ventricular size. Stress Images:  There is decreased uptake in the apical lateral wall. Small sized, mild intensity,mostly fixed perfusion defect. Rest Images:  There is decreased uptake in the apical lateral wall. Subtraction (SDS):  There is a fixed apical lateral defect that is most consistent with breast attenuation.; There is no evidence of scar or ischemia.  Impression Exercise Capacity:  Lexiscan with no exercise. BP Response:  Normal blood pressure response. Clinical Symptoms:  There is dyspnea. ECG Impression:  No significant ECG changes with Lexiscan. Comparison with Prior Nuclear Study: No previous nuclear study performed LV Wall Motion:  NL LV Function; NL Wall Motion  Overall Impression:  Normal stress nuclear study with mild breast attenuation; Low risk stress nuclear study.    Marykay Lex, MD  12/07/2012 1:22 PM

## 2012-12-21 DIAGNOSIS — E119 Type 2 diabetes mellitus without complications: Secondary | ICD-10-CM | POA: Diagnosis not present

## 2012-12-21 DIAGNOSIS — E785 Hyperlipidemia, unspecified: Secondary | ICD-10-CM | POA: Diagnosis not present

## 2012-12-21 DIAGNOSIS — I119 Hypertensive heart disease without heart failure: Secondary | ICD-10-CM | POA: Diagnosis not present

## 2013-01-02 DIAGNOSIS — I10 Essential (primary) hypertension: Secondary | ICD-10-CM | POA: Diagnosis not present

## 2013-01-02 DIAGNOSIS — R7309 Other abnormal glucose: Secondary | ICD-10-CM | POA: Diagnosis not present

## 2013-01-02 DIAGNOSIS — E119 Type 2 diabetes mellitus without complications: Secondary | ICD-10-CM | POA: Diagnosis not present

## 2013-01-12 DIAGNOSIS — E119 Type 2 diabetes mellitus without complications: Secondary | ICD-10-CM | POA: Diagnosis not present

## 2013-01-12 DIAGNOSIS — E1169 Type 2 diabetes mellitus with other specified complication: Secondary | ICD-10-CM | POA: Diagnosis not present

## 2013-01-23 ENCOUNTER — Other Ambulatory Visit: Payer: Self-pay | Admitting: Dermatology

## 2013-01-23 DIAGNOSIS — L538 Other specified erythematous conditions: Secondary | ICD-10-CM | POA: Diagnosis not present

## 2013-01-23 DIAGNOSIS — L820 Inflamed seborrheic keratosis: Secondary | ICD-10-CM | POA: Diagnosis not present

## 2013-01-23 DIAGNOSIS — L719 Rosacea, unspecified: Secondary | ICD-10-CM | POA: Diagnosis not present

## 2013-01-23 DIAGNOSIS — D485 Neoplasm of uncertain behavior of skin: Secondary | ICD-10-CM | POA: Diagnosis not present

## 2013-01-23 DIAGNOSIS — L821 Other seborrheic keratosis: Secondary | ICD-10-CM | POA: Diagnosis not present

## 2013-01-29 DIAGNOSIS — E039 Hypothyroidism, unspecified: Secondary | ICD-10-CM | POA: Diagnosis not present

## 2013-01-29 DIAGNOSIS — Z6837 Body mass index (BMI) 37.0-37.9, adult: Secondary | ICD-10-CM | POA: Diagnosis not present

## 2013-01-29 DIAGNOSIS — E1169 Type 2 diabetes mellitus with other specified complication: Secondary | ICD-10-CM | POA: Diagnosis not present

## 2013-01-29 DIAGNOSIS — E559 Vitamin D deficiency, unspecified: Secondary | ICD-10-CM | POA: Diagnosis not present

## 2013-01-29 DIAGNOSIS — F411 Generalized anxiety disorder: Secondary | ICD-10-CM | POA: Insufficient documentation

## 2013-01-29 DIAGNOSIS — I10 Essential (primary) hypertension: Secondary | ICD-10-CM | POA: Diagnosis not present

## 2013-01-29 DIAGNOSIS — Z1331 Encounter for screening for depression: Secondary | ICD-10-CM | POA: Diagnosis not present

## 2013-01-29 DIAGNOSIS — E785 Hyperlipidemia, unspecified: Secondary | ICD-10-CM | POA: Diagnosis not present

## 2013-02-02 DIAGNOSIS — H35369 Drusen (degenerative) of macula, unspecified eye: Secondary | ICD-10-CM | POA: Diagnosis not present

## 2013-02-02 DIAGNOSIS — H2510 Age-related nuclear cataract, unspecified eye: Secondary | ICD-10-CM | POA: Diagnosis not present

## 2013-02-02 DIAGNOSIS — E119 Type 2 diabetes mellitus without complications: Secondary | ICD-10-CM | POA: Diagnosis not present

## 2013-02-02 DIAGNOSIS — H02409 Unspecified ptosis of unspecified eyelid: Secondary | ICD-10-CM | POA: Diagnosis not present

## 2013-02-06 ENCOUNTER — Telehealth: Payer: Self-pay | Admitting: Cardiovascular Disease

## 2013-02-06 NOTE — Telephone Encounter (Signed)
Returned call.  Pt informed samples left at front desk for pick up.  Pt verbalized understanding and agreed w/ plan.

## 2013-02-06 NOTE — Telephone Encounter (Signed)
Pamela [name] is asking for samples of Crestor -10mg  . Please call if we have any@ [phone]  Thanks

## 2013-03-12 DIAGNOSIS — F411 Generalized anxiety disorder: Secondary | ICD-10-CM | POA: Diagnosis not present

## 2013-03-12 DIAGNOSIS — E559 Vitamin D deficiency, unspecified: Secondary | ICD-10-CM | POA: Diagnosis not present

## 2013-03-12 DIAGNOSIS — Z6836 Body mass index (BMI) 36.0-36.9, adult: Secondary | ICD-10-CM | POA: Diagnosis not present

## 2013-03-12 DIAGNOSIS — E1169 Type 2 diabetes mellitus with other specified complication: Secondary | ICD-10-CM | POA: Diagnosis not present

## 2013-03-12 DIAGNOSIS — E785 Hyperlipidemia, unspecified: Secondary | ICD-10-CM | POA: Diagnosis not present

## 2013-03-12 DIAGNOSIS — E039 Hypothyroidism, unspecified: Secondary | ICD-10-CM | POA: Diagnosis not present

## 2013-04-06 ENCOUNTER — Encounter: Payer: Self-pay | Admitting: Cardiovascular Disease

## 2013-04-06 ENCOUNTER — Ambulatory Visit (INDEPENDENT_AMBULATORY_CARE_PROVIDER_SITE_OTHER): Payer: Medicare Other | Admitting: Cardiovascular Disease

## 2013-04-06 VITALS — BP 136/70 | HR 63 | Ht 68.0 in | Wt 238.6 lb

## 2013-04-06 DIAGNOSIS — E669 Obesity, unspecified: Secondary | ICD-10-CM

## 2013-04-06 DIAGNOSIS — E119 Type 2 diabetes mellitus without complications: Secondary | ICD-10-CM

## 2013-04-06 DIAGNOSIS — I10 Essential (primary) hypertension: Secondary | ICD-10-CM

## 2013-04-06 DIAGNOSIS — E785 Hyperlipidemia, unspecified: Secondary | ICD-10-CM | POA: Diagnosis not present

## 2013-04-06 DIAGNOSIS — Z79899 Other long term (current) drug therapy: Secondary | ICD-10-CM

## 2013-04-06 MED ORDER — ROSUVASTATIN CALCIUM 10 MG PO TABS: 10.0000 mg | ORAL_TABLET | ORAL | Status: DC

## 2013-04-06 NOTE — Patient Instructions (Addendum)
Your physician recommends that you return for lab work in: next week. You will need to be fasting. NMR with lipid, CMP  Your physician wants you to follow-up in: 6 months. You will receive a reminder letter in the mail two months in advance. If you don't receive a letter, please call our office to schedule the follow-up appointment.

## 2013-04-07 ENCOUNTER — Encounter: Payer: Self-pay | Admitting: Cardiovascular Disease

## 2013-04-07 DIAGNOSIS — E119 Type 2 diabetes mellitus without complications: Secondary | ICD-10-CM | POA: Insufficient documentation

## 2013-04-07 DIAGNOSIS — E669 Obesity, unspecified: Secondary | ICD-10-CM | POA: Insufficient documentation

## 2013-04-07 DIAGNOSIS — E785 Hyperlipidemia, unspecified: Secondary | ICD-10-CM | POA: Insufficient documentation

## 2013-04-07 DIAGNOSIS — I10 Essential (primary) hypertension: Secondary | ICD-10-CM | POA: Insufficient documentation

## 2013-04-07 NOTE — Progress Notes (Signed)
Patient ID: Pamela [name], female   DOB: 1946-01-18, 67 y.o.   MRN: [id number]     HPI: Pamela [name], is a 67 y.o. female who presents followup cardiology evaluation. I last saw her approximately 3 months ago. Pamela [name] is a 67 year old female who has a history of obesity, hypertension, type 2 diabetes mellitus for greater than 25 years, as well as hyperlipidemia. Recently, she had suboptimally controlled diabetic diabetes. She had experienced episodes of chest tightness and a Lexiscan Myoview study revealed normal perfusion without scar or ischemia. Post-rest ejection fraction was 74%. Over the last 4 months, she has been trying to lose weight her weight has reduced from 257 pounds 2 and 38 pounds over this time period. She was recently started on lipid-lowering therapy MR lipoprotein LDL particle number of 1834 and LDL of 110, small LDL particles of 1083 and triglycerides of 176. Initially she was taking Crestor 10 mg daily has reduced his dose to take 10 mg every other day along with coinciding to 10 22 transient myalgias. She seems to tolerate the every other day dose. She presents for followup evaluation. She denies any recurrent chest pain. There is no shortness of breath. He has improved energy particularly with her weight loss.  Past Medical History  Diagnosis Date  . Hypertension   . Diabetes mellitus   . Hyperlipidemia     No past surgical history on file.  Allergies  Allergen Reactions  . Demerol (Meperidine) Other (See Comments)    Unspecified   . Macrobid (Nitrofurantoin Macrocrystal) Other (See Comments)    Unspecified   . Meclizine Other (See Comments)    Unspecified   . Sulfa Antibiotics Other (See Comments)    Unspecified     Current Outpatient Prescriptions  Medication Sig Dispense Refill  . Alogliptin-Metformin HCl (KAZANO) 12.01-999 MG TABS Take by mouth daily.      Marland Kitchen aspirin 81 MG tablet Take 81 mg by mouth daily.      . carvedilol (COREG) 6.25 MG tablet Take  6.25 mg by mouth 2 (two) times daily with a meal.      . Cholecalciferol (VITAMIN D) 2000 UNITS CAPS Take 1 capsule by mouth daily.      . Coenzyme Q10 (CO Q 10) 100 MG CAPS Take 200 mg by mouth daily.      Marland Kitchen glimepiride (AMARYL) 2 MG tablet Take 2 mg by mouth daily before breakfast.      . levothyroxine (SYNTHROID, LEVOTHROID) 137 MCG tablet Take 137 mcg by mouth daily before breakfast. One daily, 1 and 1/2 on Sunday      . lisinopril (PRINIVIL,ZESTRIL) 40 MG tablet Take 40 mg by mouth daily.      . metFORMIN (GLUCOPHAGE-XR) 500 MG 24 hr tablet Take 500 mg by mouth 2 (two) times daily.      . Potassium Gluconate 595 MG CAPS Take 1 capsule by mouth daily.      . rosuvastatin (CRESTOR) 10 MG tablet Take 1 tablet (10 mg total) by mouth every other day.  28 tablet  0   No current facility-administered medications for this visit.    Socially she is married to she has one child. There is no tobacco or alcohol use.  ROS is negative for fevers, chills or night sweats. She denies any recent palpitations. His lost approximately 19 pounds over several months. She denies recurrent chest pain she denies GI symptoms she denies paresthesias. There is no edema. Her myalgias have improved with every  other day dosing of her Crestor taking in combination with coenzyme Q10.   Other system review is negative.  PE BP 136/70  Pulse 63  Ht 5\' 8"  (1.727 m)  Wt 238 lb 9.6 oz (108.228 kg)  BMI 36.29 kg/m2   Repeat BP by me 122/74 General: Alert, oriented, no distress.  Skin: normal turgor, no rashes HEENT: Normocephalic, atraumatic. Pupils round and reactive; sclera anicteric;no lid lag.  Nose without nasal septal hypertrophy Mouth/Parynx benign; Mallinpatti scale 3 Neck: No JVD, no carotid briuts Lungs: clear to ausculatation and percussion; no wheezing or rales Heart: RRR, s1 s2 normal 1/6 systolic murmur Abdomen: Moderate central adiposity soft, nontender; no hepatosplenomehaly, BS+; abdominal aorta  nontender and not dilated by palpation. Pulses 2+ Extremities: Trace edema ;no clubbing cyanosis, Homan's sign negative  Neurologic: grossly nonfocal  ECG: Normal sinus rhythm at 63 beats per minute. Normal intervals.  LABS:  BMET    Component Value Date/Time   NA 141 06/01/2009 0635   K 3.9 06/01/2009 0635   CL 107 06/01/2009 0635   CO2 24 06/01/2009 0635   GLUCOSE 165* 06/01/2009 0635   BUN 11 06/01/2009 0635   CREATININE 0.60 06/01/2009 0635   CALCIUM 9.4 06/01/2009 0635   GFRNONAA >60 06/01/2009 0635   GFRAA  Value: >60        The eGFR has been calculated using the MDRD equation. This calculation has not been validated in all clinical situations. eGFR's persistently <60 mL/min signify possible Chronic Kidney Disease. 06/01/2009 0635     Hepatic Function Panel     Component Value Date/Time   PROT 6.5 05/31/2009 2203   ALBUMIN 3.4* 05/31/2009 2203   AST 26 05/31/2009 2203   ALT 24 05/31/2009 2203   ALKPHOS 76 05/31/2009 2203   BILITOT 0.6 05/31/2009 2203     CBC    Component Value Date/Time   WBC 11.0* 06/02/2009 0530   RBC 4.13 06/02/2009 0530   HGB 12.0 06/02/2009 0530   HCT 35.7* 06/02/2009 0530   PLT 285 06/02/2009 0530   MCV 86.4 06/02/2009 0530   MCHC 33.7 06/02/2009 0530   RDW 15.5 06/02/2009 0530   LYMPHSABS 3.4 05/31/2009 2203   MONOABS 0.7 05/31/2009 2203   EOSABS 0.1 05/31/2009 2203   BASOSABS 0.0 05/31/2009 2203     BNP    Component Value Date/Time   PROBNP 38.0 05/31/2009 2203    Lipid Panel     Component Value Date/Time   CHOL  Value: 205        ATP III CLASSIFICATION:  <200     mg/dL   Desirable  161-096  mg/dL   Borderline High  >=045    mg/dL   High       * 12/20/8117 0635   TRIG 159* 06/01/2009 0635   HDL 62 06/01/2009 0635   CHOLHDL 3.3 06/01/2009 0635   VLDL 32 06/01/2009 0635   LDLCALC  Value: 111        Total Cholesterol/HDL:CHD Risk Coronary Heart Disease Risk Table                     Men   Women  1/2 Average Risk   3.4   3.3  Average Risk       5.0   4.4   2 X Average Risk   9.6   7.1  3 X Average Risk  23.4   11.0        Use the calculated Patient Ratio  above and the CHD Risk Table to determine the patient's CHD Risk.        ATP III CLASSIFICATION (LDL):  <100     mg/dL   Optimal  161-096  mg/dL   Near or Above                    Optimal  130-159  mg/dL   Borderline  045-409  mg/dL   High  >811     mg/dL   Very High* 05/27/7828 5621     RADIOLOGY: No results found.    ASSESSMENT AND PLAN: At the time of this fluid continues to to do well. I commended her on her 19 pound weight loss this should significantly improve her diabetic control. UnC has improved from 8.8-7.9 on her most recent assessment I am now recommending followup laboratory be obtained and we'll check a seen at as well as a likely sinus with lipid panel the discussed increase X. is addition to continued weight loss the blood pressure is well controlled on current therapy. Time she does have trace lower extremity edema. He discussed a prescription. I will notify her regarding her laboratory results. As long as she is stable I will see her in 6 months for followup evaluation.      Lennette Bihari, MD, [facility]  04/07/2013 6:17 PM

## 2013-04-10 ENCOUNTER — Encounter: Payer: Self-pay | Admitting: Cardiovascular Disease

## 2013-04-12 ENCOUNTER — Other Ambulatory Visit: Payer: Self-pay | Admitting: *Deleted

## 2013-04-17 DIAGNOSIS — E785 Hyperlipidemia, unspecified: Secondary | ICD-10-CM | POA: Diagnosis not present

## 2013-04-17 DIAGNOSIS — Z79899 Other long term (current) drug therapy: Secondary | ICD-10-CM | POA: Diagnosis not present

## 2013-04-17 LAB — NMR LIPOPROFILE WITH LIPIDS
LDL (calc): 62 mg/dL (ref <100)
LDL Particle Number: 1048 nmol/L — ABNORMAL HIGH (ref <1000)
LDL Size: 20.1 nm — ABNORMAL LOW (ref >20.5)
LP-IR Score: 45 (ref <=45)
Small LDL Particle Number: 777 nmol/L — ABNORMAL HIGH (ref <=527)
VLDL Size: 46.8 nm — ABNORMAL HIGH (ref <=46.6)

## 2013-04-17 LAB — COMPREHENSIVE METABOLIC PANEL WITH GFR
ALT: 22 U/L (ref 0–35)
AST: 25 U/L (ref 0–37)
Albumin: 3.9 g/dL (ref 3.5–5.2)
Alkaline Phosphatase: 67 U/L (ref 39–117)
BUN: 11 mg/dL (ref 6–23)
CO2: 26 meq/L (ref 19–32)
Calcium: 10.1 mg/dL (ref 8.4–10.5)
Chloride: 105 meq/L (ref 96–112)
Creat: 0.64 mg/dL (ref 0.50–1.10)
Glucose, Bld: 172 mg/dL — ABNORMAL HIGH (ref 70–99)
Potassium: 4.4 meq/L (ref 3.5–5.3)
Sodium: 139 meq/L (ref 135–145)
Total Bilirubin: 0.4 mg/dL (ref 0.3–1.2)
Total Protein: 6.8 g/dL (ref 6.0–8.3)

## 2013-04-27 ENCOUNTER — Encounter: Payer: Self-pay | Admitting: *Deleted

## 2013-05-02 ENCOUNTER — Telehealth: Payer: Self-pay | Admitting: Cardiovascular Disease

## 2013-05-02 NOTE — Telephone Encounter (Signed)
Would like lab results from about 2 weeks ago please.

## 2013-05-02 NOTE — Telephone Encounter (Signed)
Returned call.  Lab results reviewed and pt informed she should receive a letter soon if she hasn't already.  Pt encouraged to continue current medications and monitor diet and exercise.  Walk as tolerated.  Pt also encouraged to continue losing weight as she stated she has lost some weight.  Pt will look for results in mail and share w/ PCP.

## 2013-05-28 ENCOUNTER — Telehealth: Payer: Self-pay | Admitting: Cardiovascular Disease

## 2013-05-28 MED ORDER — ROSUVASTATIN CALCIUM 10 MG PO TABS: 10.0000 mg | ORAL_TABLET | ORAL | Status: DC

## 2013-05-28 NOTE — Telephone Encounter (Signed)
Would like some samples of Crestor 10 mg please.

## 2013-05-28 NOTE — Telephone Encounter (Signed)
Returned call and pt informed samples left at front desk.  Pt verbalized understanding and agreed w/ plan.    

## 2013-06-05 ENCOUNTER — Other Ambulatory Visit: Payer: Self-pay

## 2013-06-05 DIAGNOSIS — Z1231 Encounter for screening mammogram for malignant neoplasm of breast: Secondary | ICD-10-CM

## 2013-06-29 DIAGNOSIS — L84 Corns and callosities: Secondary | ICD-10-CM | POA: Diagnosis not present

## 2013-06-29 DIAGNOSIS — Z6834 Body mass index (BMI) 34.0-34.9, adult: Secondary | ICD-10-CM | POA: Diagnosis not present

## 2013-06-29 DIAGNOSIS — E039 Hypothyroidism, unspecified: Secondary | ICD-10-CM | POA: Diagnosis not present

## 2013-06-29 DIAGNOSIS — E785 Hyperlipidemia, unspecified: Secondary | ICD-10-CM | POA: Diagnosis not present

## 2013-06-29 DIAGNOSIS — E559 Vitamin D deficiency, unspecified: Secondary | ICD-10-CM | POA: Diagnosis not present

## 2013-06-29 DIAGNOSIS — E1169 Type 2 diabetes mellitus with other specified complication: Secondary | ICD-10-CM | POA: Diagnosis not present

## 2013-07-11 ENCOUNTER — Ambulatory Visit
Admission: RE | Admit: 2013-07-11 | Discharge: 2013-07-11 | Disposition: A | Discharge status: Home / Self Care | Payer: Medicare Other | Source: Ambulatory Visit

## 2013-07-11 DIAGNOSIS — Z1231 Encounter for screening mammogram for malignant neoplasm of breast: Secondary | ICD-10-CM

## 2013-07-23 ENCOUNTER — Encounter: Payer: Self-pay | Admitting: Internal Medicine

## 2013-07-27 DIAGNOSIS — Z01419 Encounter for gynecological examination (general) (routine) without abnormal findings: Secondary | ICD-10-CM | POA: Diagnosis not present

## 2013-07-27 DIAGNOSIS — C50919 Malignant neoplasm of unspecified site of unspecified female breast: Secondary | ICD-10-CM | POA: Diagnosis not present

## 2013-07-27 DIAGNOSIS — Z0000 Encounter for general adult medical examination without abnormal findings: Secondary | ICD-10-CM | POA: Diagnosis not present

## 2013-09-27 ENCOUNTER — Encounter: Payer: Self-pay | Admitting: Internal Medicine

## 2013-09-28 ENCOUNTER — Ambulatory Visit (AMBULATORY_SURGERY_CENTER): Payer: Medicare Other | Admitting: *Deleted

## 2013-09-28 VITALS — Ht 67.0 in | Wt 233.0 lb

## 2013-09-28 DIAGNOSIS — Z1211 Encounter for screening for malignant neoplasm of colon: Secondary | ICD-10-CM

## 2013-09-28 MED ORDER — PEG-KCL-NACL-NASULF-NA ASC-C 100 G PO SOLR: ORAL | Status: DC

## 2013-09-28 NOTE — Progress Notes (Signed)
No egg or soy allergy 

## 2013-10-01 DIAGNOSIS — E559 Vitamin D deficiency, unspecified: Secondary | ICD-10-CM | POA: Diagnosis not present

## 2013-10-01 DIAGNOSIS — E039 Hypothyroidism, unspecified: Secondary | ICD-10-CM | POA: Diagnosis not present

## 2013-10-01 DIAGNOSIS — E1169 Type 2 diabetes mellitus with other specified complication: Secondary | ICD-10-CM | POA: Diagnosis not present

## 2013-10-01 DIAGNOSIS — E785 Hyperlipidemia, unspecified: Secondary | ICD-10-CM | POA: Diagnosis not present

## 2013-10-01 DIAGNOSIS — Z6835 Body mass index (BMI) 35.0-35.9, adult: Secondary | ICD-10-CM | POA: Diagnosis not present

## 2013-10-01 DIAGNOSIS — L97909 Non-pressure chronic ulcer of unspecified part of unspecified lower leg with unspecified severity: Secondary | ICD-10-CM | POA: Diagnosis not present

## 2013-10-01 DIAGNOSIS — I10 Essential (primary) hypertension: Secondary | ICD-10-CM | POA: Diagnosis not present

## 2013-10-12 ENCOUNTER — Ambulatory Visit (AMBULATORY_SURGERY_CENTER): Payer: Medicare Other | Admitting: Internal Medicine

## 2013-10-12 ENCOUNTER — Encounter: Payer: Self-pay | Admitting: Internal Medicine

## 2013-10-12 VITALS — BP 127/59 | HR 63 | Temp 98.6°F | Resp 18 | Ht 67.0 in | Wt 233.0 lb

## 2013-10-12 DIAGNOSIS — E669 Obesity, unspecified: Secondary | ICD-10-CM | POA: Diagnosis not present

## 2013-10-12 DIAGNOSIS — E039 Hypothyroidism, unspecified: Secondary | ICD-10-CM | POA: Diagnosis not present

## 2013-10-12 DIAGNOSIS — Z1211 Encounter for screening for malignant neoplasm of colon: Secondary | ICD-10-CM | POA: Diagnosis not present

## 2013-10-12 DIAGNOSIS — D126 Benign neoplasm of colon, unspecified: Secondary | ICD-10-CM

## 2013-10-12 DIAGNOSIS — I10 Essential (primary) hypertension: Secondary | ICD-10-CM | POA: Diagnosis not present

## 2013-10-12 DIAGNOSIS — E119 Type 2 diabetes mellitus without complications: Secondary | ICD-10-CM | POA: Diagnosis not present

## 2013-10-12 LAB — GLUCOSE, CAPILLARY
GLUCOSE-CAPILLARY: 148 mg/dL — AB (ref 70–99)
Glucose-Capillary: 172 mg/dL — ABNORMAL HIGH (ref 70–99)

## 2013-10-12 MED ORDER — SODIUM CHLORIDE 0.9 % IV SOLN: 500.0000 mL | INTRAVENOUS | Status: DC

## 2013-10-12 NOTE — Progress Notes (Signed)
Procedure ends, to recovery, report given and VSS. 

## 2013-10-12 NOTE — Progress Notes (Signed)
No complaints noted in the recovery room. Maw   

## 2013-10-12 NOTE — Op Note (Signed)
[facility]  [address] [place], [id number]   COLONOSCOPY PROCEDURE REPORT  PATIENT: Pamela [name], Pamela [name]  MR#: [id number] BIRTHDATE: [birth year] , 51  yrs. old GENDER: Female ENDOSCOPIST: Lafayette Dragon, MD REFERRED [id number] Forde Dandy, M.D. PROCEDURE DATE:  10/12/2013 PROCEDURE:   Colonoscopy with snare polypectomy and Colonoscopy with cold biopsy polypectomy First Screening Colonoscopy - Avg.  risk and is 50 yrs.  old or older - No.  Prior Negative Screening - Now for repeat screening. 10 or more years since last screening  History of Adenoma - Now for follow-up colonoscopy & has been > or = to 3 yrs.  N/A  Polyps Removed Today? Yes. ASA CLASS:   Class II INDICATIONS:Average risk patient for colon cancer and last colonoscopy June 2005 showed large ileocecal valve but no polyps.  MEDICATIONS: MAC sedation, administered by CRNA and propofol (Diprivan) 300mg  IV  DESCRIPTION OF PROCEDURE:   After the risks benefits and alternatives of the procedure were thoroughly explained, informed consent was obtained.  A digital rectal exam revealed no abnormalities of the rectum.   The [id number]  endoscope was introduced through the anus and advanced to the cecum, which was identified by both the appendix and ileocecal valve. No adverse events experienced.   The quality of the prep was good, using MoviPrep  The instrument was then slowly withdrawn as the colon was fully examined.      COLON FINDINGS: Two polypoid shaped sessile polyps ranging between 3-67mm in size were found in the ascending colon and sigmoid colon. A polypectomy was performed with a cold snare and with cold forceps.  The resection was complete and the polyp tissue was completely retrieved.   There was mild diverticulosis noted throughout the entire examined colon with associated muscular hypertrophy.  Retroflexed views revealed no abnormalities. The time to cecum=9 minutes 39 seconds.  Withdrawal  time=7 minutes 02 seconds.  The scope was withdrawn and the procedure completed. COMPLICATIONS: There were no complications.  ENDOSCOPIC IMPRESSION: 1.   Two sessile polyps ranging between 3-85mm in size were found in the ascending colon and sigmoid colon; polypectomy was performed with a cold snare (at 50 cm) and with cold forceps  ( ascending colon) 2.   There was mild diverticulosis noted throughout the entire examined colon  RECOMMENDATIONS: 1.  Await pathology results 2.  high fiber diet Recall colonoscopy pending path report   eSigned:  Lafayette Dragon, MD 10/12/2013 9:11 AM   cc:   PATIENT NAME:  Pamela [name], Pamela [name] MR#: [id number]

## 2013-10-12 NOTE — Patient Instructions (Signed)
YOU HAD AN ENDOSCOPIC PROCEDURE TODAY AT THE [facility]: Refer to the procedure report that was given to you for any specific questions about what was found during the examination.  If the procedure report does not answer your questions, please call your gastroenterologist to clarify.  If you requested that your care partner not be given the details of your procedure findings, then the procedure report has been included in a sealed envelope for you to review at your convenience later.  YOU SHOULD EXPECT: Some feelings of bloating in the abdomen. Passage of more gas than usual.  Walking can help get rid of the air that was put into your GI tract during the procedure and reduce the bloating. If you had a lower endoscopy (such as a colonoscopy or flexible sigmoidoscopy) you may notice spotting of blood in your stool or on the toilet paper. If you underwent a bowel prep for your procedure, then you may not have a normal bowel movement for a few days.  DIET: Your first meal following the procedure should be a light meal and then it is ok to progress to your normal diet.  A half-sandwich or bowl of soup is an example of a good first meal.  Heavy or fried foods are harder to digest and may make you feel nauseous or bloated.  Likewise meals heavy in dairy and vegetables can cause extra gas to form and this can also increase the bloating.  Drink plenty of fluids but you should avoid alcoholic beverages for 24 hours.  ACTIVITY: Your care partner should take you home directly after the procedure.  You should plan to take it easy, moving slowly for the rest of the day.  You can resume normal activity the day after the procedure however you should NOT DRIVE or use heavy machinery for 24 hours (because of the sedation medicines used during the test).    SYMPTOMS TO REPORT IMMEDIATELY: A gastroenterologist can be reached at any hour.  During normal business hours, 8:30 AM to 5:00 PM Monday through Friday,  call [phone].  After hours and on weekends, please call the GI answering service at [phone] who will take a message and have the physician on call contact you.   Following lower endoscopy (colonoscopy or flexible sigmoidoscopy):  Excessive amounts of blood in the stool  Significant tenderness or worsening of abdominal pains  Swelling of the abdomen that is new, acute  Fever of 100F or higher    FOLLOW UP: If any biopsies were taken you will be contacted by phone or by letter within the next 1-3 weeks.  Call your gastroenterologist if you have not heard about the biopsies in 3 weeks.  Our staff will call the home number listed on your records the next business day following your procedure to check on you and address any questions or concerns that you may have at that time regarding the information given to you following your procedure. This is a courtesy call and so if there is no answer at the home number and we have not heard from you through the emergency physician on call, we will assume that you have returned to your regular daily activities without incident.  SIGNATURES/CONFIDENTIALITY: You and/or your care partner have signed paperwork which will be entered into your electronic medical record.  These signatures attest to the fact that that the information above on your After Visit Summary has been reviewed and is understood.  Full responsibility of the confidentiality   of this discharge information lies with you and/or your care-partner.   Handouts were given to your care partner on polyps, diverticulosis and a high fiber diet with liberal fluid intake. You may resume your current medications today. Please call if any questions or concerns. You blood sugar was 148 in the recovery room.

## 2013-10-12 NOTE — Progress Notes (Signed)
Called to room to assist during endoscopic procedure.  Patient ID and intended procedure confirmed with present staff. Received instructions for my participation in the procedure from the performing physician.  

## 2013-10-15 ENCOUNTER — Telehealth: Payer: Self-pay | Admitting: *Deleted

## 2013-10-15 NOTE — Telephone Encounter (Signed)
Left message that we called for f/u 

## 2013-10-16 ENCOUNTER — Encounter: Payer: Self-pay | Admitting: Internal Medicine

## 2013-10-17 ENCOUNTER — Encounter: Payer: Self-pay | Admitting: *Deleted

## 2014-02-08 DIAGNOSIS — H35369 Drusen (degenerative) of macula, unspecified eye: Secondary | ICD-10-CM | POA: Diagnosis not present

## 2014-02-08 DIAGNOSIS — H2510 Age-related nuclear cataract, unspecified eye: Secondary | ICD-10-CM | POA: Diagnosis not present

## 2014-02-08 DIAGNOSIS — H524 Presbyopia: Secondary | ICD-10-CM | POA: Diagnosis not present

## 2014-02-08 DIAGNOSIS — E119 Type 2 diabetes mellitus without complications: Secondary | ICD-10-CM | POA: Diagnosis not present

## 2014-02-08 DIAGNOSIS — H25019 Cortical age-related cataract, unspecified eye: Secondary | ICD-10-CM | POA: Diagnosis not present

## 2014-02-25 DIAGNOSIS — I7300 Raynaud's syndrome without gangrene: Secondary | ICD-10-CM | POA: Diagnosis not present

## 2014-02-25 DIAGNOSIS — E559 Vitamin D deficiency, unspecified: Secondary | ICD-10-CM | POA: Diagnosis not present

## 2014-02-25 DIAGNOSIS — E785 Hyperlipidemia, unspecified: Secondary | ICD-10-CM | POA: Diagnosis not present

## 2014-02-25 DIAGNOSIS — I10 Essential (primary) hypertension: Secondary | ICD-10-CM | POA: Diagnosis not present

## 2014-02-25 DIAGNOSIS — Z1331 Encounter for screening for depression: Secondary | ICD-10-CM | POA: Diagnosis not present

## 2014-02-25 DIAGNOSIS — E1169 Type 2 diabetes mellitus with other specified complication: Secondary | ICD-10-CM | POA: Diagnosis not present

## 2014-02-25 DIAGNOSIS — E039 Hypothyroidism, unspecified: Secondary | ICD-10-CM | POA: Diagnosis not present

## 2014-02-25 DIAGNOSIS — L84 Corns and callosities: Secondary | ICD-10-CM | POA: Diagnosis not present

## 2014-02-25 DIAGNOSIS — H8110 Benign paroxysmal vertigo, unspecified ear: Secondary | ICD-10-CM | POA: Diagnosis not present

## 2014-02-25 DIAGNOSIS — D126 Benign neoplasm of colon, unspecified: Secondary | ICD-10-CM | POA: Diagnosis not present

## 2014-04-17 DIAGNOSIS — I69998 Other sequelae following unspecified cerebrovascular disease: Secondary | ICD-10-CM | POA: Diagnosis not present

## 2014-04-23 DIAGNOSIS — H8110 Benign paroxysmal vertigo, unspecified ear: Secondary | ICD-10-CM | POA: Diagnosis not present

## 2014-04-23 DIAGNOSIS — R42 Dizziness and giddiness: Secondary | ICD-10-CM | POA: Diagnosis not present

## 2014-04-23 DIAGNOSIS — I10 Essential (primary) hypertension: Secondary | ICD-10-CM | POA: Diagnosis not present

## 2014-04-23 DIAGNOSIS — E039 Hypothyroidism, unspecified: Secondary | ICD-10-CM | POA: Diagnosis not present

## 2014-04-26 DIAGNOSIS — R42 Dizziness and giddiness: Secondary | ICD-10-CM | POA: Diagnosis not present

## 2014-04-26 DIAGNOSIS — I69998 Other sequelae following unspecified cerebrovascular disease: Secondary | ICD-10-CM | POA: Diagnosis not present

## 2014-04-30 DIAGNOSIS — I69998 Other sequelae following unspecified cerebrovascular disease: Secondary | ICD-10-CM | POA: Diagnosis not present

## 2014-04-30 DIAGNOSIS — R42 Dizziness and giddiness: Secondary | ICD-10-CM | POA: Diagnosis not present

## 2014-05-01 DIAGNOSIS — E1169 Type 2 diabetes mellitus with other specified complication: Secondary | ICD-10-CM | POA: Diagnosis not present

## 2014-05-01 DIAGNOSIS — R42 Dizziness and giddiness: Secondary | ICD-10-CM | POA: Diagnosis not present

## 2014-05-01 DIAGNOSIS — E559 Vitamin D deficiency, unspecified: Secondary | ICD-10-CM | POA: Diagnosis not present

## 2014-05-01 DIAGNOSIS — E785 Hyperlipidemia, unspecified: Secondary | ICD-10-CM | POA: Diagnosis not present

## 2014-05-01 DIAGNOSIS — E039 Hypothyroidism, unspecified: Secondary | ICD-10-CM | POA: Diagnosis not present

## 2014-05-01 DIAGNOSIS — I10 Essential (primary) hypertension: Secondary | ICD-10-CM | POA: Diagnosis not present

## 2014-05-01 DIAGNOSIS — Z6836 Body mass index (BMI) 36.0-36.9, adult: Secondary | ICD-10-CM | POA: Diagnosis not present

## 2014-05-07 DIAGNOSIS — I69998 Other sequelae following unspecified cerebrovascular disease: Secondary | ICD-10-CM | POA: Diagnosis not present

## 2014-05-07 DIAGNOSIS — R42 Dizziness and giddiness: Secondary | ICD-10-CM | POA: Diagnosis not present

## 2014-06-05 ENCOUNTER — Other Ambulatory Visit: Payer: Self-pay

## 2014-06-05 DIAGNOSIS — Z1231 Encounter for screening mammogram for malignant neoplasm of breast: Secondary | ICD-10-CM

## 2014-07-08 DIAGNOSIS — E119 Type 2 diabetes mellitus without complications: Secondary | ICD-10-CM | POA: Diagnosis not present

## 2014-07-08 DIAGNOSIS — I10 Essential (primary) hypertension: Secondary | ICD-10-CM | POA: Diagnosis not present

## 2014-07-08 DIAGNOSIS — E039 Hypothyroidism, unspecified: Secondary | ICD-10-CM | POA: Diagnosis not present

## 2014-07-08 DIAGNOSIS — D126 Benign neoplasm of colon, unspecified: Secondary | ICD-10-CM | POA: Diagnosis not present

## 2014-07-08 DIAGNOSIS — E785 Hyperlipidemia, unspecified: Secondary | ICD-10-CM | POA: Diagnosis not present

## 2014-07-08 DIAGNOSIS — L84 Corns and callosities: Secondary | ICD-10-CM | POA: Diagnosis not present

## 2014-07-12 ENCOUNTER — Ambulatory Visit
Admission: RE | Admit: 2014-07-12 | Discharge: 2014-07-12 | Disposition: A | Discharge status: Home / Self Care | Payer: Medicare Other | Source: Ambulatory Visit

## 2014-07-12 DIAGNOSIS — Z1231 Encounter for screening mammogram for malignant neoplasm of breast: Secondary | ICD-10-CM | POA: Diagnosis not present

## 2014-07-25 ENCOUNTER — Telehealth: Payer: Self-pay | Admitting: Cardiovascular Disease

## 2014-07-26 NOTE — Telephone Encounter (Signed)
Closed encounter °

## 2014-08-16 ENCOUNTER — Ambulatory Visit: Payer: Medicare Other | Admitting: Cardiovascular Disease

## 2014-08-20 ENCOUNTER — Ambulatory Visit (INDEPENDENT_AMBULATORY_CARE_PROVIDER_SITE_OTHER): Payer: Medicare Other | Admitting: Cardiovascular Disease

## 2014-08-20 ENCOUNTER — Encounter: Payer: Self-pay | Admitting: Cardiovascular Disease

## 2014-08-20 VITALS — BP 150/70 | HR 65 | Ht 68.0 in | Wt 246.4 lb

## 2014-08-20 DIAGNOSIS — I10 Essential (primary) hypertension: Secondary | ICD-10-CM | POA: Diagnosis not present

## 2014-08-20 DIAGNOSIS — E119 Type 2 diabetes mellitus without complications: Secondary | ICD-10-CM | POA: Diagnosis not present

## 2014-08-20 DIAGNOSIS — E785 Hyperlipidemia, unspecified: Secondary | ICD-10-CM | POA: Diagnosis not present

## 2014-08-20 MED ORDER — HYDROCHLOROTHIAZIDE 12.5 MG PO CAPS: 12.5000 mg | ORAL_CAPSULE | Freq: Every day | ORAL | Status: DC

## 2014-08-20 NOTE — Patient Instructions (Signed)
Your physician has recommended you make the following change in your medication: new prescription for the hydrochlorothiazide. This has already been sent to your pharmacy  Your physician recommends that you return for lab work in: 1-2 weeks. No appointment needed.  Your physician wants you to follow-up in: 6 months or sooner if needed. You will receive a reminder letter in the mail two months in advance. If you don't receive a letter, please call our office to schedule the follow-up appointment.Marland Kitchen

## 2014-08-20 NOTE — Progress Notes (Signed)
Patient ID: Pamela [name], female   DOB: 1945/10/03, 68 y.o.   MRN: [id number]    HPI: Pamela [name], is a 68 y.o. female who presents followup cardiology evaluation. I last saw her in July 2014.  Ms. League has a history of obesity, hypertension, type 2 diabetes mellitus for greater than 26 years, as well as hyperlipidemia. A Lexiscan Myoview study in March 2014 which was done for evaluation of chest pain revealed normal perfusion without scar or ischemia. Post-rest ejection fraction was 74%.  She has a history of obesity and when I had seen her last year she had lost approximaltey 20 pounds from previously.  Over the past year, she has gained some of this weight back.  She has a history of hyperlipidemia and had been started on lipid-lowering therapy when her NMR lipoprotein LDL particle number was 1834 and LDL of 110, small LDL particles of 1083 and triglycerides of 176. Initially she was taking Crestor 10 mg daily but developed myalgias and reduced the dose to every other day.  Apparently, she is no longer taking this therapy.   Over the past year, she admits to some leg swelling.  She is contemplating joiningTOPS (taking off pounds sensibly) through her church.  She is now on lisinopril 40 mg daily and carvedilol 6.25 twice a day for blood pressure.  With reference to her diabetes mellitus.  She is on metformin 500 mg twice a day and Amaryl 2 mg in the morning.  She is on levothyroxine 137 g for hypothyroidism.  She presents for evaluation.  Past Medical History  Diagnosis Date  . Hypertension   . Diabetes mellitus   . Hyperlipidemia   . GERD (gastroesophageal reflux disease)   . Heart murmur   . Thyroid disease     hypo    Past Surgical History  Procedure Laterality Date  . Colonoscopy    . Tonsillectomy    . Cholecystectomy    . Abdominal hysterectomy      Allergies  Allergen Reactions  . Demerol [Meperidine] Other (See Comments)    Unspecified   . Macrobid [Nitrofurantoin  Macrocrystal] Other (See Comments)    Unspecified   . Meclizine Other (See Comments)    Unspecified   . Sulfa Antibiotics Other (See Comments)    Unspecified     Current Outpatient Prescriptions  Medication Sig Dispense Refill  . Alogliptin-Metformin HCl (KAZANO) 12.01-999 MG TABS Take by mouth daily.    Marland Kitchen aspirin 81 MG tablet Take 325 mg by mouth daily.     . carvedilol (COREG) 6.25 MG tablet Take 6.25 mg by mouth 2 (two) times daily with a meal.    . Coenzyme Q10 (CO Q 10) 100 MG CAPS Take 200 mg by mouth daily.    Marland Kitchen glimepiride (AMARYL) 2 MG tablet Take 2 mg by mouth daily before breakfast.    . levothyroxine (SYNTHROID, LEVOTHROID) 137 MCG tablet Take 137 mcg by mouth daily before breakfast. One daily, 1 and 1/2 on Sunday    . lisinopril (PRINIVIL,ZESTRIL) 40 MG tablet Take 40 mg by mouth daily.    . metFORMIN (GLUCOPHAGE-XR) 500 MG 24 hr tablet Take 500 mg by mouth 2 (two) times daily at 8 am and 10 pm.     . Potassium Gluconate 595 MG CAPS Take 1 capsule by mouth daily.    . hydrochlorothiazide (MICROZIDE) 12.5 MG capsule Take 1 capsule (12.5 mg total) by mouth daily. 30 capsule 6   No current facility-administered medications  for this visit.    Socially she is married to she has one child. There is no tobacco or alcohol use.  ROS General: Negative; No fevers, chills, or night sweats;  HEENT: Negative; No changes in vision or hearing, sinus congestion, difficulty swallowing Pulmonary: Negative; No cough, wheezing, shortness of breath, hemoptysis Cardiovascular: Negative; No chest pain, presyncope, syncope, palpitations Occasional leg swelling GI: Negative; No nausea, vomiting, diarrhea, or abdominal pain GU: Negative; No dysuria, hematuria, or difficulty voiding Musculoskeletal: Negative; no myalgias, joint pain, or weakness Hematologic/Oncology: Negative; no easy bruising, bleeding Endocrine: Negative; no heat/cold intolerance; no diabetes Neuro: Negative; no changes in  balance, headaches Skin: Negative; No rashes or skin lesions Psychiatric: Negative; No behavioral problems, depression Sleep: Negative; No snoring, daytime sleepiness, hypersomnolence, bruxism, restless legs, hypnogognic hallucinations, no cataplexy Other comprehensive 14 point system review is negative.   PE BP 150/70 mmHg  Pulse 65  Ht $R'5\' 8"'ow$  (1.727 m)  Wt 246 lb 6.4 oz (111.766 kg)  BMI 37.47 kg/m2   Repeat BP by me 160/78 General: Alert, oriented, no distress.  Skin: normal turgor, no rashes HEENT: Normocephalic, atraumatic. Pupils round and reactive; sclera anicteric;no lid lag.  Nose without nasal septal hypertrophy Mouth/Parynx benign; Mallinpatti scale 3 Neck: No JVD, no carotid bruits with normal carotid upstroke Lungs: clear to ausculatation and percussion; no wheezing or rales Chest wall: Nontender to palpation Heart: RRR, s1 s2 normal 1/6 systolic murmur Abdomen: Moderate central adiposity soft, nontender; no hepatosplenomehaly, BS+; abdominal aorta nontender and not dilated by palpation. Back: No CVA tenderness Pulses 2+ Extremities: Trace edema pretibially;no clubbing cyanosis, Homan's sign negative  Neurologic: grossly nonfocal; cranial nerves grossly normal.  Psychological: Normal affect and mood  ECG (independently read by me): Normal sinus rhythm at 65 bpm.  No ectopy.  Normal intervals.  No ST segment changes.     July 2014 ECG: Normal sinus rhythm at 63 beats per minute. Normal intervals.  LABS:  BMET    Component Value Date/Time   NA 139 04/17/2013 0856   K 4.4 04/17/2013 0856   CL 105 04/17/2013 0856   CO2 26 04/17/2013 0856   GLUCOSE 172* 04/17/2013 0856   BUN 11 04/17/2013 0856   CREATININE 0.64 04/17/2013 0856   CREATININE 0.60 06/01/2009 0635   CALCIUM 10.1 04/17/2013 0856   GFRNONAA >60 06/01/2009 0635   GFRAA  06/01/2009 0635    >60        The eGFR has been calculated using the MDRD equation. This calculation has not been validated in  all clinical situations. eGFR's persistently <60 mL/min signify possible Chronic Kidney Disease.     Hepatic Function Panel     Component Value Date/Time   PROT 6.8 04/17/2013 0856   ALBUMIN 3.9 04/17/2013 0856   AST 25 04/17/2013 0856   ALT 22 04/17/2013 0856   ALKPHOS 67 04/17/2013 0856   BILITOT 0.4 04/17/2013 0856     CBC    Component Value Date/Time   WBC 11.0* 06/02/2009 0530   RBC 4.13 06/02/2009 0530   HGB 12.0 06/02/2009 0530   HCT 35.7* 06/02/2009 0530   PLT 285 06/02/2009 0530   MCV 86.4 06/02/2009 0530   MCHC 33.7 06/02/2009 0530   RDW 15.5 06/02/2009 0530   LYMPHSABS 3.4 05/31/2009 2203   MONOABS 0.7 05/31/2009 2203   EOSABS 0.1 05/31/2009 2203   BASOSABS 0.0 05/31/2009 2203     BNP    Component Value Date/Time   PROBNP 38.0 05/31/2009 2203  Lipid Panel     Component Value Date/Time   CHOL * 06/01/2009 0635    205        ATP III CLASSIFICATION:  <200     mg/dL   Desirable  200-239  mg/dL   Borderline High  >=240    mg/dL   High          TRIG 159* 04/17/2013 0856   TRIG 159* 06/01/2009 0635   HDL 62 06/01/2009 0635   CHOLHDL 3.3 06/01/2009 0635   VLDL 32 06/01/2009 0635   LDLCALC 62 04/17/2013 0856   LDLCALC * 06/01/2009 0635    111        Total Cholesterol/HDL:CHD Risk Coronary Heart Disease Risk Table                     Men   Women  1/2 Average Risk   3.4   3.3  Average Risk       5.0   4.4  2 X Average Risk   9.6   7.1  3 X Average Risk  23.4   11.0        Use the calculated Patient Ratio above and the CHD Risk Table to determine the patient's CHD Risk.        ATP III CLASSIFICATION (LDL):  <100     mg/dL   Optimal  100-129  mg/dL   Near or Above                    Optimal  130-159  mg/dL   Borderline  160-189  mg/dL   High  >190     mg/dL   Very High     RADIOLOGY: No results found.    ASSESSMENT AND PLAN:  Ms. Julizza Sassone is a 69 year old female who has a history of moderate obesity, hypertension, type 2  diabetes mellitus, and hypothyroidism.  Since I last saw her one year ago she has regained 8 pounds from a prior 20 pound weight loss.  Her body mass index today is 37.47 kg/m.  I have recommended that she obtain further information about the TOPS program since I'm aware of several patients who had significant benefit with weight loss with this program.  She has a history of hypertension and has been on lisinopril 40 mg daily in addition to carvedilol 6.25 mg twice a day.  Her heart rate is stable at 65 bpm.  Her blood pressure is elevated and  repeat by me was 160/78.  She does have mild pretibial edema.  For this reason, I amelecting to add HCTZ 12.5 mg to her medical regimen which should be helpful for both blood pressure improvement and resolution of her leg swelling.  She will undergo follow-up chemistry profile to make certain she is tolerating this from a renal perspective and I will try to obtain additional laboratory results from her primary physician , Dr. Forde Dandy who will be checking additional laboratory at her annual visit.  Target LDL is less than 70 in this diabetic female.  If her lipid studies revealed that she is not at target, consideration for an additional statin drug with coenzyme Q10 or Zetia may be necessary.  She denies any recent episodes of chest tightness.  A nuclear perfusion study has demonstrated normal perfusion.  I will see her in 6 months for cardiology reevaluation or sooner if problems arise.  Time spent: 25 minutes   Troy Sine, MD, [facility]  08/21/2014 5:26  PM

## 2014-09-23 DIAGNOSIS — H903 Sensorineural hearing loss, bilateral: Secondary | ICD-10-CM | POA: Diagnosis not present

## 2014-09-26 DIAGNOSIS — I10 Essential (primary) hypertension: Secondary | ICD-10-CM | POA: Diagnosis not present

## 2014-09-27 LAB — BASIC METABOLIC PANEL
BUN: 19 mg/dL (ref 6–23)
CALCIUM: 9 mg/dL (ref 8.4–10.5)
CO2: 27 mEq/L (ref 19–32)
Chloride: 102 mEq/L (ref 96–112)
Creat: 0.69 mg/dL (ref 0.50–1.10)
GLUCOSE: 239 mg/dL — AB (ref 70–99)
POTASSIUM: 4.2 meq/L (ref 3.5–5.3)
Sodium: 137 mEq/L (ref 135–145)

## 2014-11-05 DIAGNOSIS — Z6836 Body mass index (BMI) 36.0-36.9, adult: Secondary | ICD-10-CM | POA: Diagnosis not present

## 2014-11-05 DIAGNOSIS — E785 Hyperlipidemia, unspecified: Secondary | ICD-10-CM | POA: Diagnosis not present

## 2014-11-05 DIAGNOSIS — D126 Benign neoplasm of colon, unspecified: Secondary | ICD-10-CM | POA: Diagnosis not present

## 2014-11-05 DIAGNOSIS — E039 Hypothyroidism, unspecified: Secondary | ICD-10-CM | POA: Diagnosis not present

## 2014-11-05 DIAGNOSIS — E559 Vitamin D deficiency, unspecified: Secondary | ICD-10-CM | POA: Diagnosis not present

## 2014-11-05 DIAGNOSIS — E1165 Type 2 diabetes mellitus with hyperglycemia: Secondary | ICD-10-CM | POA: Diagnosis not present

## 2014-11-05 DIAGNOSIS — I10 Essential (primary) hypertension: Secondary | ICD-10-CM | POA: Diagnosis not present

## 2015-01-07 ENCOUNTER — Telehealth: Payer: Self-pay | Admitting: Cardiovascular Disease

## 2015-01-07 ENCOUNTER — Encounter: Payer: Self-pay | Admitting: Cardiovascular Disease

## 2015-01-07 NOTE — Telephone Encounter (Signed)
Closed encounter °

## 2015-02-04 DIAGNOSIS — Z6835 Body mass index (BMI) 35.0-35.9, adult: Secondary | ICD-10-CM | POA: Diagnosis not present

## 2015-02-04 DIAGNOSIS — E039 Hypothyroidism, unspecified: Secondary | ICD-10-CM | POA: Diagnosis not present

## 2015-02-04 DIAGNOSIS — E1165 Type 2 diabetes mellitus with hyperglycemia: Secondary | ICD-10-CM | POA: Diagnosis not present

## 2015-02-04 DIAGNOSIS — E1142 Type 2 diabetes mellitus with diabetic polyneuropathy: Secondary | ICD-10-CM | POA: Diagnosis not present

## 2015-02-04 DIAGNOSIS — E785 Hyperlipidemia, unspecified: Secondary | ICD-10-CM | POA: Diagnosis not present

## 2015-02-04 DIAGNOSIS — I10 Essential (primary) hypertension: Secondary | ICD-10-CM | POA: Diagnosis not present

## 2015-02-04 DIAGNOSIS — Z1389 Encounter for screening for other disorder: Secondary | ICD-10-CM | POA: Diagnosis not present

## 2015-02-04 DIAGNOSIS — M1990 Unspecified osteoarthritis, unspecified site: Secondary | ICD-10-CM | POA: Diagnosis not present

## 2015-02-17 DIAGNOSIS — H35361 Drusen (degenerative) of macula, right eye: Secondary | ICD-10-CM | POA: Diagnosis not present

## 2015-02-17 DIAGNOSIS — H2513 Age-related nuclear cataract, bilateral: Secondary | ICD-10-CM | POA: Diagnosis not present

## 2015-02-17 DIAGNOSIS — E119 Type 2 diabetes mellitus without complications: Secondary | ICD-10-CM | POA: Diagnosis not present

## 2015-02-17 DIAGNOSIS — H02403 Unspecified ptosis of bilateral eyelids: Secondary | ICD-10-CM | POA: Diagnosis not present

## 2015-02-24 ENCOUNTER — Ambulatory Visit: Payer: Medicare Other | Admitting: Cardiovascular Disease

## 2015-03-10 ENCOUNTER — Ambulatory Visit (INDEPENDENT_AMBULATORY_CARE_PROVIDER_SITE_OTHER): Payer: Medicare Other | Admitting: Cardiovascular Disease

## 2015-03-10 ENCOUNTER — Encounter: Payer: Self-pay | Admitting: Cardiovascular Disease

## 2015-03-10 VITALS — BP 122/64 | HR 73 | Ht 68.0 in | Wt 235.4 lb

## 2015-03-10 DIAGNOSIS — E669 Obesity, unspecified: Secondary | ICD-10-CM | POA: Diagnosis not present

## 2015-03-10 DIAGNOSIS — E785 Hyperlipidemia, unspecified: Secondary | ICD-10-CM

## 2015-03-10 DIAGNOSIS — E119 Type 2 diabetes mellitus without complications: Secondary | ICD-10-CM

## 2015-03-10 DIAGNOSIS — I10 Essential (primary) hypertension: Secondary | ICD-10-CM | POA: Diagnosis not present

## 2015-03-10 DIAGNOSIS — Z8639 Personal history of other endocrine, nutritional and metabolic disease: Secondary | ICD-10-CM

## 2015-03-10 NOTE — Patient Instructions (Signed)
Your Doctor recommends a echocardiogram and office visit in 1 year or sooner if needed.

## 2015-03-12 ENCOUNTER — Encounter: Payer: Self-pay | Admitting: Cardiovascular Disease

## 2015-03-12 DIAGNOSIS — Z8639 Personal history of other endocrine, nutritional and metabolic disease: Secondary | ICD-10-CM | POA: Insufficient documentation

## 2015-03-12 NOTE — Progress Notes (Signed)
Patient ID: Pamela [name], female   DOB: 10-05-1945, 69 y.o.   MRN: [id number]    HPI: Pamela [name] is a 69 y.o. female who presents 73 monthfollowup cardiology evaluation.  Pamela [name] has a long-standing history of obesity, hypertension, type 2 diabetes mellitus, and hyperlipidemia. A Lexiscan Myoview study in March 2014  done for evaluation of chest pain revealed normal perfusion without scar or ischemia. Post-rest ejection fraction was 74%.  She has a history of obesity and when I had seen her in 2013 she had lost approximaltey 20 pounds. She has a history of hyperlipidemia and had been started on lipid-lowering therapy when her NMR lipoprotein LDL particle number was 1834 and LDL of 110, small LDL particles of 1083 and triglycerides of 176. Initially she was taking Crestor 10 mg daily but developed myalgias and reduced the dose to every other day.  She is no longer taking statin therapy.  She is followed by Pamela [name].  She states that she is inactive and does not exercise.  She has noticed shortness of breath with activity.  She denies chest pressure.  She is unaware of palpitations.  She has been taking since levothyroxine 137 g daily with the exception of one half on Sundays, and for diabetes is on metformin, Amaryl and Kazano.  For hypertension she is on carvedilol 6.25 twice a day. HCTZ 12.5 mg, and lisinopril 40 mg daily. She presents for evaluation.  Past Medical History  Diagnosis Date  . Hypertension   . Diabetes mellitus   . Hyperlipidemia   . GERD (gastroesophageal reflux disease)   . Heart murmur   . Thyroid disease     hypo    Past Surgical History  Procedure Laterality Date  . Colonoscopy    . Tonsillectomy    . Cholecystectomy    . Abdominal hysterectomy      Allergies  Allergen Reactions  . Demerol [Meperidine] Other (See Comments)    Unspecified   . Macrobid [Nitrofurantoin Macrocrystal] Other (See Comments)    Unspecified   . Meclizine Other (See  Comments)    Unspecified   . Sulfa Antibiotics Other (See Comments)    Unspecified     Current Outpatient Prescriptions  Medication Sig Dispense Refill  . Alogliptin-Metformin HCl (KAZANO) 12.01-999 MG TABS Take by mouth daily.    Marland Kitchen aspirin 81 MG tablet Take 325 mg by mouth daily.     . carvedilol (COREG) 6.25 MG tablet Take 6.25 mg by mouth 2 (two) times daily with a meal.    . Coenzyme Q10 (CO Q 10) 100 MG CAPS Take 200 mg by mouth daily.    Marland Kitchen glimepiride (AMARYL) 2 MG tablet Take 2 mg by mouth daily before breakfast.    . hydrochlorothiazide (MICROZIDE) 12.5 MG capsule Take 1 capsule (12.5 mg total) by mouth daily. 30 capsule 6  . levothyroxine (SYNTHROID, LEVOTHROID) 137 MCG tablet Take 137 mcg by mouth daily before breakfast. One daily, 1 and 1/2 on Sunday    . lisinopril (PRINIVIL,ZESTRIL) 40 MG tablet Take 40 mg by mouth daily.    . metFORMIN (GLUCOPHAGE-XR) 500 MG 24 hr tablet Take 500 mg by mouth 2 (two) times daily at 8 am and 10 pm.     . Potassium Gluconate 595 MG CAPS Take 1 capsule by mouth daily.     No current facility-administered medications for this visit.    Socially she is married to she has one child. There is no tobacco or alcohol  use.  ROS General: Negative; No fevers, chills, or night sweats;  HEENT: Negative; No changes in vision or hearing, sinus congestion, difficulty swallowing Pulmonary: Negative; No cough, wheezing, shortness of breath, hemoptysis Cardiovascular: Negative; No chest pain, presyncope, syncope, palpitations Occasional leg swelling GI: Negative; No nausea, vomiting, diarrhea, or abdominal pain GU: Negative; No dysuria, hematuria, or difficulty voiding Musculoskeletal: Negative; no myalgias, joint pain, or weakness Hematologic/Oncology: Negative; no easy bruising, bleeding Endocrine: Negative; no heat/cold intolerance; no diabetes Neuro: Negative; no changes in balance, headaches Skin: Negative; No rashes or skin lesions Psychiatric:  Negative; No behavioral problems, depression Sleep: Negative; No snoring, daytime sleepiness, hypersomnolence, bruxism, restless legs, hypnogognic hallucinations, no cataplexy Other comprehensive 14 point system review is negative.   PE BP 122/64 mmHg  Pulse 73  Ht _0  (1.727 m)  Wt 235 lb 6.4 oz (106.777 kg)  BMI 35.80 kg/m2    Wt Readings from Last 3 Encounters:  03/10/15 235 lb 6.4 oz (106.777 kg)  08/20/14 246 lb 6.4 oz (111.766 kg)  10/12/13 233 lb (105.688 kg)   General: Alert, oriented, no distress.  Skin: normal turgor, no rashes HEENT: Normocephalic, atraumatic. Pupils round and reactive; sclera anicteric;no lid lag.  Nose without nasal septal hypertrophy Mouth/Parynx benign; Mallinpatti scale 3 Neck: No JVD, no carotid bruits with normal carotid upstroke Lungs: clear to ausculatation and percussion; no wheezing or rales Chest wall: Nontender to palpation Heart: RRR, s1 s2 normal 1/6 systolic murmur; no diastolic murmur.  No rubs thrills or heaves. Abdomen: Moderate central adiposity soft, nontender; no hepatosplenomehaly, BS+; abdominal aorta nontender and not dilated by palpation. Back: No CVA tenderness Pulses 2+ Extremities: Trace edema pretibially;no clubbing cyanosis, Homan's sign negative  Neurologic: grossly nonfocal; cranial nerves grossly normal.  Psychological: Normal affect and mood  ECG (independently read by me): Normal sinus rhythm at 73 bpm.  No ectopy.  Normal intervals.  December 2015 ECG (independently read by me): Normal sinus rhythm at 65 bpm.  No ectopy.  Normal intervals.  No ST segment changes.     July 2014 ECG: Normal sinus rhythm at 63 beats per minute. Normal intervals.  LABS:  BMP Latest Ref Rng 09/26/2014 04/17/2013 06/01/2009  Glucose 70 - 99 mg/dL 239(H) 172(H) 165(H)  BUN 6 - 23 mg/dL _1 Creatinine 0.50 - 1.10 mg/dL 0.69 0.64 0.60  Sodium 135 - 145 mEq/L 137 139 141  Potassium 3.5 - 5.3 mEq/L 4.2 4.4 3.9  Chloride 96 - 112  mEq/L 102 105 107  CO2 19 - 32 mEq/L _2 Calcium 8.4 - 10.5 mg/dL 9.0 10.1 9.4   Hepatic Function Latest Ref Rng 04/17/2013 05/31/2009  Total Protein 6.0 - 8.3 g/dL 6.8 6.5  Albumin 3.5 - 5.2 g/dL 3.9 3.4(L)  AST 0 - 37 U/L 25 26  ALT 0 - 35 U/L 22 24  Alk Phosphatase 39 - 117 U/L 67 76  Total Bilirubin 0.3 - 1.2 mg/dL 0.4 0.6   CBC Latest Ref Rng 06/02/2009 06/01/2009 05/31/2009  WBC 4.0 - 10.5 K/uL 11.0(H) 9.6 11.1(H)  Hemoglobin 12.0 - 15.0 g/dL 12.0 12.8 12.0  Hematocrit 36.0 - 46.0 % 35.7(L) 38.4 35.6(L)  Platelets 150 - 400 K/uL 285 310 291   Lab Results  Component Value Date   MCV 86.4 06/02/2009   MCV 86.1 06/01/2009   MCV 86.7 05/31/2009      I also reviewed blood work done on 11/14/2014 at [facility] by Dr. Reynold Bowen.  Glucose  was increased at 258.  Total cholesterol 213, triglycerides 207, HDL 59, LDL 113.  LFTs normal.  BUN 19, creatinine 0.8.  RADIOLOGY: No results found.    ASSESSMENT AND PLAN:  Pamela [name] is a 69 year old female who has a history of moderate obesity, hypertension, type 2 diabetes mellitus, and hypothyroidism.  I last saw her, I added HCTZ to her medical regimen.  Her blood pressure today is improved now on lisinopril 40 mg, HCTZ 12.5 mg, and carvedilol 6.25 mg twice a day.  Her resting pulse is in the 70s.  She's not having any chest pain.  She is on aspirin for and a platelet therapy.  I reviewed recent blood work by Dr. Forde Dandy.  She no longer is taking a statin.  Target LDL ideally is less than 70 in this diabetic female.  She is inactive and does not exercise.  Her body mass index is 35.8 compatible with moderate obesity.  Increased activity and exercise was recommended.  Her last echo Doppler study was in 2012.  I will see her in one year for follow-up evaluation and prior to that office visit.  She will undergo a five-year follow-up echo Doppler study to reassess LV systolic and diastolic function as well as valvular  architecture.  I will see in the office in follow-up and further conditions will be made at that time.  Time spent: 25 minutes  Troy Sine, MD, [facility]  03/12/2015 10:21 PM

## 2015-03-18 ENCOUNTER — Other Ambulatory Visit: Payer: Self-pay | Admitting: Cardiovascular Disease

## 2015-03-18 NOTE — Telephone Encounter (Signed)
Rx(s) sent to pharmacy electronically.  

## 2015-05-05 ENCOUNTER — Encounter: Payer: Self-pay | Admitting: Podiatry

## 2015-05-05 ENCOUNTER — Ambulatory Visit (INDEPENDENT_AMBULATORY_CARE_PROVIDER_SITE_OTHER): Payer: Medicare Other | Admitting: Podiatry

## 2015-05-05 ENCOUNTER — Ambulatory Visit (INDEPENDENT_AMBULATORY_CARE_PROVIDER_SITE_OTHER): Payer: Medicare Other

## 2015-05-05 VITALS — BP 115/50 | HR 71 | Resp 16 | Ht 68.0 in | Wt 235.0 lb

## 2015-05-05 DIAGNOSIS — M2142 Flat foot [pes planus] (acquired), left foot: Secondary | ICD-10-CM

## 2015-05-05 DIAGNOSIS — M79672 Pain in left foot: Secondary | ICD-10-CM

## 2015-05-05 DIAGNOSIS — M779 Enthesopathy, unspecified: Secondary | ICD-10-CM

## 2015-05-05 MED ORDER — TRIAMCINOLONE ACETONIDE 10 MG/ML IJ SUSP
10.0000 mg | Freq: Once | INTRAMUSCULAR | Status: AC
  Administered 2015-05-05: 10 mg

## 2015-05-05 NOTE — Patient Instructions (Signed)

## 2015-05-05 NOTE — Progress Notes (Signed)
   Subjective:    Patient ID: Pamela [name], female    DOB: [birth year], 69 y.o.   MRN: [id number]  HPI Patient presents with foot pain in their left foot; medial side; swelling. Pt stated, "great toe has been hurting and radiating down foot". This started middle of May 2016.  Pt is diabetic and sugar level was 145 this morning. . Review of Systems  HENT: Positive for hearing loss and tinnitus.   Cardiovascular: Positive for palpitations.  Gastrointestinal: Positive for diarrhea and abdominal distention.  Musculoskeletal: Positive for back pain and gait problem.  All other systems reviewed and are negative.      Objective:   Physical Exam        Assessment & Plan:

## 2015-05-06 NOTE — Progress Notes (Signed)
Subjective:     Patient ID: Pamela [name], female   DOB: 06/27/1946, 69 y.o.   MRN: [id number]  HPI patient presents stating that the inside of the left ankle has been bothering her and she does not remember a specific injury but it's been present for around 4 months   Review of Systems  All other systems reviewed and are negative.      Objective:   Physical Exam  Constitutional: She is oriented to person, place, and time.  Cardiovascular: Intact distal pulses.   Musculoskeletal: Normal range of motion.  Neurological: She is oriented to person, place, and time.  Skin: Skin is warm.  Nursing note and vitals reviewed.  neurovascular status intact muscle strength adequate with range of motion subtalar midtarsal joint within normal limits. Patient's found have inflammation the inside of the left ankle around the posterior tibial tendon and is noted to be localized to this area. Patient had muscle strength testing of posterior tib and does not appear to have a tear of the tendon and it appears to be strictly inflammatory complex at this time her to have good digital perfusion and is well oriented 3 and does have good range of motion of the big toe joint     Assessment:     Tendinitis of the left posterior tibial tendon with moderate depression of the arch noted and no current indication of tear    Plan:     H&P and x-rays reviewed with patient. I discussed the etiology of this condition and the considerations long-term for MRI if symptoms do not reduce but we went ahead today and injected the sheath 3 mg dexamethasone Kenalog 5 mg Xylocaine and applied fascial brace to lift the arch. I gave instructions on physical therapy reduced activity and reappoint again in the next 3 weeks and may require a different type of an orthotic. Patient is wearing diabetic shoes with custom insoles at this time

## 2015-05-08 ENCOUNTER — Ambulatory Visit: Payer: Medicare Other | Admitting: Podiatry

## 2015-05-12 ENCOUNTER — Ambulatory Visit (INDEPENDENT_AMBULATORY_CARE_PROVIDER_SITE_OTHER): Payer: Medicare Other | Admitting: Podiatry

## 2015-05-12 ENCOUNTER — Encounter: Payer: Self-pay | Admitting: Podiatry

## 2015-05-12 VITALS — BP 129/66 | HR 69 | Resp 16

## 2015-05-12 DIAGNOSIS — M2142 Flat foot [pes planus] (acquired), left foot: Secondary | ICD-10-CM

## 2015-05-12 DIAGNOSIS — M779 Enthesopathy, unspecified: Secondary | ICD-10-CM

## 2015-05-12 DIAGNOSIS — M79672 Pain in left foot: Secondary | ICD-10-CM

## 2015-05-13 NOTE — Progress Notes (Signed)
Subjective:     Patient ID: Pamela [name], female   DOB: 29-Sep-1945, 69 y.o.   MRN: [id number]  HPI patient states my foot is feeling quite a bit better with minimal discomfort if I walk too much   Review of Systems     Objective:   Physical Exam Neurovascular status intact negative Homans sign was noted with patient doing well post heel pain and tendinitis. States that he's able to walk well with minimal discomfort    Assessment:     Doing well post fasciitis and tendinitis condition    Plan:     H&P and x-rays reviewed with patient. This time I have recommended anti-inflammatory physical therapy supportive shoe gear and patient be seen back as needed

## 2015-05-22 DIAGNOSIS — E559 Vitamin D deficiency, unspecified: Secondary | ICD-10-CM | POA: Diagnosis not present

## 2015-05-22 DIAGNOSIS — E039 Hypothyroidism, unspecified: Secondary | ICD-10-CM | POA: Diagnosis not present

## 2015-05-22 DIAGNOSIS — E1142 Type 2 diabetes mellitus with diabetic polyneuropathy: Secondary | ICD-10-CM | POA: Diagnosis not present

## 2015-05-22 DIAGNOSIS — I10 Essential (primary) hypertension: Secondary | ICD-10-CM | POA: Diagnosis not present

## 2015-05-22 DIAGNOSIS — I7300 Raynaud's syndrome without gangrene: Secondary | ICD-10-CM | POA: Diagnosis not present

## 2015-05-22 DIAGNOSIS — E785 Hyperlipidemia, unspecified: Secondary | ICD-10-CM | POA: Diagnosis not present

## 2015-05-22 DIAGNOSIS — E1165 Type 2 diabetes mellitus with hyperglycemia: Secondary | ICD-10-CM | POA: Diagnosis not present

## 2015-05-22 DIAGNOSIS — D126 Benign neoplasm of colon, unspecified: Secondary | ICD-10-CM | POA: Diagnosis not present

## 2015-05-22 DIAGNOSIS — E119 Type 2 diabetes mellitus without complications: Secondary | ICD-10-CM | POA: Diagnosis not present

## 2015-05-22 DIAGNOSIS — Z6836 Body mass index (BMI) 36.0-36.9, adult: Secondary | ICD-10-CM | POA: Diagnosis not present

## 2015-05-22 DIAGNOSIS — Z1389 Encounter for screening for other disorder: Secondary | ICD-10-CM | POA: Diagnosis not present

## 2015-06-16 ENCOUNTER — Other Ambulatory Visit: Payer: Self-pay

## 2015-06-16 DIAGNOSIS — Z1231 Encounter for screening mammogram for malignant neoplasm of breast: Secondary | ICD-10-CM

## 2015-07-08 ENCOUNTER — Encounter: Payer: Self-pay | Admitting: Podiatry

## 2015-07-08 ENCOUNTER — Ambulatory Visit (INDEPENDENT_AMBULATORY_CARE_PROVIDER_SITE_OTHER): Payer: Medicare Other | Admitting: Podiatry

## 2015-07-08 ENCOUNTER — Ambulatory Visit (INDEPENDENT_AMBULATORY_CARE_PROVIDER_SITE_OTHER): Payer: Medicare Other

## 2015-07-08 VITALS — BP 170/83 | HR 66 | Resp 16

## 2015-07-08 DIAGNOSIS — M2142 Flat foot [pes planus] (acquired), left foot: Secondary | ICD-10-CM | POA: Diagnosis not present

## 2015-07-08 DIAGNOSIS — M779 Enthesopathy, unspecified: Secondary | ICD-10-CM

## 2015-07-08 MED ORDER — TRIAMCINOLONE ACETONIDE 10 MG/ML IJ SUSP
10.0000 mg | Freq: Once | INTRAMUSCULAR | Status: AC
  Administered 2015-07-08: 10 mg

## 2015-07-09 NOTE — Progress Notes (Signed)
Subjective:     Patient ID: Pamela [name], female   DOB: Aug 03, 1946, 69 y.o.   MRN: [id number]  HPI patient states that she's continuing to have collapse medial longitudinal arch and that she's continuing develop pain in this left ankle and the top of the left foot. She states that the outside of the ankle is starting to hurt her a lot and that it has been a problem it seems to be getting worse   Review of Systems     Objective:   Physical Exam Neurovascular status intact muscle strength adequate with patient noted to have collapse medial longitudinal arch left with posterior tibial that's intact but is not functioning well. There continues to be pain around posterior tibial tendon and quite a bit of discomfort in the dorsal and lateral side of the ankle especially in the sinus tarsi    Assessment:     Posterior tibial tendon dysfunction left with collapse medial longitudinal arch leading to increased symptoms which are gotten worse over the last 6 months. Sinus tarsitis left    Plan:     Reviewed x-rays and worsening of condition and went ahead and injected the sinus tarsi left 3 mg Kenalog 5 mill grams Xylocaine to try to reduce inflammation. I've recommended a a after oh brace in order to try to stabilize medial longitudinal arch and take stress off the ankle

## 2015-07-14 ENCOUNTER — Ambulatory Visit
Admission: RE | Admit: 2015-07-14 | Discharge: 2015-07-14 | Disposition: A | Discharge status: Home / Self Care | Payer: Medicare Other | Source: Ambulatory Visit

## 2015-07-14 DIAGNOSIS — Z1231 Encounter for screening mammogram for malignant neoplasm of breast: Secondary | ICD-10-CM | POA: Diagnosis not present

## 2015-07-23 ENCOUNTER — Other Ambulatory Visit: Payer: Medicare Other

## 2015-08-20 ENCOUNTER — Other Ambulatory Visit: Payer: Medicare Other

## 2015-08-25 DIAGNOSIS — M25561 Pain in right knee: Secondary | ICD-10-CM | POA: Diagnosis not present

## 2015-09-10 DIAGNOSIS — M25561 Pain in right knee: Secondary | ICD-10-CM | POA: Diagnosis not present

## 2015-09-18 DIAGNOSIS — M25561 Pain in right knee: Secondary | ICD-10-CM | POA: Diagnosis not present

## 2015-09-30 DIAGNOSIS — Z6836 Body mass index (BMI) 36.0-36.9, adult: Secondary | ICD-10-CM | POA: Diagnosis not present

## 2015-09-30 DIAGNOSIS — I10 Essential (primary) hypertension: Secondary | ICD-10-CM | POA: Diagnosis not present

## 2015-09-30 DIAGNOSIS — E1142 Type 2 diabetes mellitus with diabetic polyneuropathy: Secondary | ICD-10-CM | POA: Diagnosis not present

## 2015-09-30 DIAGNOSIS — E784 Other hyperlipidemia: Secondary | ICD-10-CM | POA: Diagnosis not present

## 2015-09-30 DIAGNOSIS — E1140 Type 2 diabetes mellitus with diabetic neuropathy, unspecified: Secondary | ICD-10-CM | POA: Diagnosis not present

## 2015-09-30 DIAGNOSIS — E669 Obesity, unspecified: Secondary | ICD-10-CM | POA: Diagnosis not present

## 2015-09-30 DIAGNOSIS — E038 Other specified hypothyroidism: Secondary | ICD-10-CM | POA: Diagnosis not present

## 2015-09-30 DIAGNOSIS — E559 Vitamin D deficiency, unspecified: Secondary | ICD-10-CM | POA: Diagnosis not present

## 2015-09-30 DIAGNOSIS — M25561 Pain in right knee: Secondary | ICD-10-CM | POA: Diagnosis not present

## 2015-11-07 DIAGNOSIS — M79672 Pain in left foot: Secondary | ICD-10-CM | POA: Diagnosis not present

## 2015-11-07 DIAGNOSIS — M2142 Flat foot [pes planus] (acquired), left foot: Secondary | ICD-10-CM | POA: Diagnosis not present

## 2015-11-14 DIAGNOSIS — M76822 Posterior tibial tendinitis, left leg: Secondary | ICD-10-CM | POA: Diagnosis not present

## 2015-11-18 DIAGNOSIS — M76822 Posterior tibial tendinitis, left leg: Secondary | ICD-10-CM | POA: Diagnosis not present

## 2015-11-28 DIAGNOSIS — M76822 Posterior tibial tendinitis, left leg: Secondary | ICD-10-CM | POA: Diagnosis not present

## 2015-12-02 DIAGNOSIS — M76822 Posterior tibial tendinitis, left leg: Secondary | ICD-10-CM | POA: Diagnosis not present

## 2015-12-05 DIAGNOSIS — M76822 Posterior tibial tendinitis, left leg: Secondary | ICD-10-CM | POA: Diagnosis not present

## 2015-12-08 ENCOUNTER — Other Ambulatory Visit: Payer: Self-pay | Admitting: Cardiovascular Disease

## 2015-12-09 DIAGNOSIS — M76822 Posterior tibial tendinitis, left leg: Secondary | ICD-10-CM | POA: Diagnosis not present

## 2015-12-29 DIAGNOSIS — H903 Sensorineural hearing loss, bilateral: Secondary | ICD-10-CM | POA: Diagnosis not present

## 2016-01-02 DIAGNOSIS — M76822 Posterior tibial tendinitis, left leg: Secondary | ICD-10-CM | POA: Diagnosis not present

## 2016-01-08 DIAGNOSIS — M2142 Flat foot [pes planus] (acquired), left foot: Secondary | ICD-10-CM | POA: Diagnosis not present

## 2016-01-13 DIAGNOSIS — Z6833 Body mass index (BMI) 33.0-33.9, adult: Secondary | ICD-10-CM | POA: Diagnosis not present

## 2016-01-13 DIAGNOSIS — E559 Vitamin D deficiency, unspecified: Secondary | ICD-10-CM | POA: Diagnosis not present

## 2016-01-13 DIAGNOSIS — N08 Glomerular disorders in diseases classified elsewhere: Secondary | ICD-10-CM | POA: Diagnosis not present

## 2016-01-13 DIAGNOSIS — E038 Other specified hypothyroidism: Secondary | ICD-10-CM | POA: Diagnosis not present

## 2016-01-13 DIAGNOSIS — F411 Generalized anxiety disorder: Secondary | ICD-10-CM | POA: Diagnosis not present

## 2016-01-13 DIAGNOSIS — E784 Other hyperlipidemia: Secondary | ICD-10-CM | POA: Diagnosis not present

## 2016-01-13 DIAGNOSIS — E1140 Type 2 diabetes mellitus with diabetic neuropathy, unspecified: Secondary | ICD-10-CM | POA: Diagnosis not present

## 2016-01-13 DIAGNOSIS — I10 Essential (primary) hypertension: Secondary | ICD-10-CM | POA: Diagnosis not present

## 2016-01-13 DIAGNOSIS — Z1389 Encounter for screening for other disorder: Secondary | ICD-10-CM | POA: Diagnosis not present

## 2016-01-13 DIAGNOSIS — E1142 Type 2 diabetes mellitus with diabetic polyneuropathy: Secondary | ICD-10-CM | POA: Diagnosis not present

## 2016-02-26 ENCOUNTER — Encounter: Payer: Self-pay | Admitting: Cardiology

## 2016-03-10 ENCOUNTER — Ambulatory Visit: Payer: Medicare Other | Admitting: Cardiology

## 2016-03-12 ENCOUNTER — Encounter: Payer: Self-pay | Admitting: Cardiology

## 2016-03-12 ENCOUNTER — Ambulatory Visit (INDEPENDENT_AMBULATORY_CARE_PROVIDER_SITE_OTHER): Payer: Medicare Other | Admitting: Cardiology

## 2016-03-12 VITALS — BP 132/64 | HR 68 | Ht 68.0 in | Wt 229.8 lb

## 2016-03-12 DIAGNOSIS — I10 Essential (primary) hypertension: Secondary | ICD-10-CM

## 2016-03-12 NOTE — Progress Notes (Signed)
03/12/2016 Pamela [name]   21-Sep-1945  VM:[id number]  Primary Physician Sheela Stack, MD Primary Cardiologist: Dr. Claiborne Billings   Reason for Visit/CC: 1 Year Preventive Cardiac Evaluation   HPI:  Pamela [name] has a long-standing history of obesity, hypertension, type 2 diabetes mellitus, and hyperlipidemia. She has no documented h/o CAD.  A Lexiscan Myoview study in March 2014 done for evaluation of chest pain revealed normal perfusion without scar or ischemia. Post-rest ejection fraction was 74%. She has a h/o statin intolerance. She is followed by Dr. Claiborne Billings. Dr. Forde Dandy follows her medically.   She presents to clinic today for 1 year f/u. She notes that she has done well. No CP or dyspnea. She is limited in terms of physical activity given orthopedic problems (knee and ankle pain). However no exertional symptoms with day to day activities. No dyspnea, orthopnea, PND, LEE, syncope / near syncope. She notes full medication compliance.   EKG shows NSR. No ischemia. BP is well controlled at 132/64.   At her last OV, 1 yr ago with Dr. Claiborne Billings, he outlined that he wanted her to get a five-year follow-up echo Doppler study to reassess LV systolic and diastolic function as well as valvular architecture. We will obtain this this year.   Current Outpatient Prescriptions  Medication Sig Dispense Refill  . aspirin 81 MG tablet Take 325 mg by mouth daily.     . carvedilol (COREG) 6.25 MG tablet Take 6.25 mg by mouth 2 (two) times daily with a meal.    . Coenzyme Q10 (CO Q 10) 100 MG CAPS Take 200 mg by mouth daily.    Marland Kitchen glimepiride (AMARYL) 2 MG tablet Take 2 mg by mouth daily before breakfast.    . hydrochlorothiazide (MICROZIDE) 12.5 MG capsule TAKE ONE CAPSULE BY MOUTH ONCE DAILY 30 capsule 6  . levothyroxine (SYNTHROID, LEVOTHROID) 137 MCG tablet Take 137 mcg by mouth daily before breakfast. One daily, 1 and 1/2 on Sunday    . Linagliptin-Metformin HCl ER (JENTADUETO XR) 2.01-999 MG TB24 Take 1 tablet by  mouth every morning.    Marland Kitchen lisinopril (PRINIVIL,ZESTRIL) 40 MG tablet Take 40 mg by mouth daily.    . Potassium Gluconate 595 MG CAPS Take 1 capsule by mouth daily.     No current facility-administered medications for this visit.    Allergies  Allergen Reactions  . Demerol [Meperidine] Other (See Comments)    Unspecified   . Macrobid [Nitrofurantoin Macrocrystal] Other (See Comments)    Unspecified   . Meclizine Other (See Comments)    Unspecified   . Sulfa Antibiotics Other (See Comments)    Unspecified     Social History   Social History  . Marital Status: Married    Spouse Name: N/A  . Number of Children: N/A  . Years of Education: N/A   Occupational History  . Not on file.   Social History Main Topics  . Smoking status: Never Smoker   . Smokeless tobacco: Never Used  . Alcohol Use: No  . Drug Use: No  . Sexual Activity: Not on file   Other Topics Concern  . Not on file   Social History Narrative     Review of Systems: General: negative for chills, fever, night sweats or weight changes.  Cardiovascular: negative for chest pain, dyspnea on exertion, edema, orthopnea, palpitations, paroxysmal nocturnal dyspnea or shortness of breath Dermatological: negative for rash Respiratory: negative for cough or wheezing Urologic: negative for hematuria Abdominal: negative for nausea, vomiting, diarrhea,  bright red blood per rectum, melena, or hematemesis Neurologic: negative for visual changes, syncope, or dizziness All other systems reviewed and are otherwise negative except as noted above.    Height 5\' 8"  (1.727 m), weight 229 lb 12.8 oz (104.237 kg).  General appearance: alert, cooperative, no distress and moderately obese Neck: no carotid bruit and no JVD Lungs: clear to auscultation bilaterally Heart: regular rate and rhythm, S1, S2 normal, no murmur, click, rub or gallop Extremities: no LEE Pulses: 2+ and symmetric Skin: warm and dry Neurologic: Grossly  normal  EKG NSR. No ischemia,   ASSESSMENT AND PLAN:   1. HTN: well controlled on current regimen. No CP or dyspnea. Followed by PCP. Continue coreg, lisinopril and HCTZ.   2. HLD: Lipid profile followed by her PCP, Dr. Forde Dandy. Patient notes h/o statin intolerance. Currently not medicated. We discussed newer class of medications, PCSK9 inhibitors. She wishes to further consider and discuss with Dr. Forde Dandy. We will place a referral to our lipid clinic.   3. DM:  Followed by PCP.  4. Obesity: we discussed increasing physical activity. She is limited in terms of physical activity given orthopedic problems (knee and ankle pain). She states she is planing on trying the recumbent bike at the [facility]. Patient was encouraged to do so. I also recommended water aerobics.   PLAN 2D echo to assess cardiac function. F/u with Dr. Claiborne Billings in 1 year.   Lyda Jester PA-C 03/12/2016 3:09 PM

## 2016-03-12 NOTE — Patient Instructions (Signed)
Medication Instructions:  None  Labwork: none  Testing/Procedures: Your physician has requested that you have an echocardiogram (order in system from last year). Echocardiography is a painless test that uses sound waves to create images of your heart. It provides your doctor with information about the size and shape of your heart and how well your heart's chambers and valves are working. This procedure takes approximately one hour. There are no restrictions for this procedure.   Follow-Up: Your physician recommends that you schedule a follow-up appointment in: our [facility] for consideration for PCSK9 (Cholesterol injectable medication).  Your physician wants you to follow-up in: 1 year with Dr. Claiborne Billings. You will receive a reminder letter in the mail two months in advance. If you don't receive a letter, please call our office to schedule the follow-up appointment.   Any Other Special Instructions Will Be Listed Below (If Applicable).     If you need a refill on your cardiac medications before your next appointment, please call your pharmacy.

## 2016-03-15 DIAGNOSIS — H2512 Age-related nuclear cataract, left eye: Secondary | ICD-10-CM | POA: Diagnosis not present

## 2016-03-15 DIAGNOSIS — H43821 Vitreomacular adhesion, right eye: Secondary | ICD-10-CM | POA: Diagnosis not present

## 2016-03-15 DIAGNOSIS — E119 Type 2 diabetes mellitus without complications: Secondary | ICD-10-CM | POA: Diagnosis not present

## 2016-03-15 DIAGNOSIS — H43822 Vitreomacular adhesion, left eye: Secondary | ICD-10-CM | POA: Diagnosis not present

## 2016-03-15 DIAGNOSIS — H2511 Age-related nuclear cataract, right eye: Secondary | ICD-10-CM | POA: Diagnosis not present

## 2016-03-30 ENCOUNTER — Ambulatory Visit (HOSPITAL_COMMUNITY): Payer: Medicare Other | Attending: Internal Medicine

## 2016-03-30 ENCOUNTER — Other Ambulatory Visit: Payer: Self-pay

## 2016-03-30 DIAGNOSIS — I10 Essential (primary) hypertension: Secondary | ICD-10-CM

## 2016-03-30 DIAGNOSIS — I119 Hypertensive heart disease without heart failure: Secondary | ICD-10-CM | POA: Diagnosis not present

## 2016-03-30 DIAGNOSIS — E119 Type 2 diabetes mellitus without complications: Secondary | ICD-10-CM | POA: Diagnosis not present

## 2016-03-30 DIAGNOSIS — Z6834 Body mass index (BMI) 34.0-34.9, adult: Secondary | ICD-10-CM | POA: Insufficient documentation

## 2016-03-30 DIAGNOSIS — E785 Hyperlipidemia, unspecified: Secondary | ICD-10-CM | POA: Diagnosis not present

## 2016-03-30 DIAGNOSIS — I340 Nonrheumatic mitral (valve) insufficiency: Secondary | ICD-10-CM | POA: Insufficient documentation

## 2016-03-30 DIAGNOSIS — E669 Obesity, unspecified: Secondary | ICD-10-CM | POA: Insufficient documentation

## 2016-03-30 DIAGNOSIS — R011 Cardiac murmur, unspecified: Secondary | ICD-10-CM | POA: Diagnosis present

## 2016-03-30 DIAGNOSIS — I071 Rheumatic tricuspid insufficiency: Secondary | ICD-10-CM | POA: Insufficient documentation

## 2016-04-09 ENCOUNTER — Encounter: Payer: Self-pay | Admitting: *Deleted

## 2016-04-19 DIAGNOSIS — N08 Glomerular disorders in diseases classified elsewhere: Secondary | ICD-10-CM | POA: Diagnosis not present

## 2016-04-19 DIAGNOSIS — E784 Other hyperlipidemia: Secondary | ICD-10-CM | POA: Diagnosis not present

## 2016-04-19 DIAGNOSIS — E1142 Type 2 diabetes mellitus with diabetic polyneuropathy: Secondary | ICD-10-CM | POA: Diagnosis not present

## 2016-04-19 DIAGNOSIS — I10 Essential (primary) hypertension: Secondary | ICD-10-CM | POA: Diagnosis not present

## 2016-04-19 DIAGNOSIS — E1149 Type 2 diabetes mellitus with other diabetic neurological complication: Secondary | ICD-10-CM | POA: Diagnosis not present

## 2016-04-19 DIAGNOSIS — E559 Vitamin D deficiency, unspecified: Secondary | ICD-10-CM | POA: Diagnosis not present

## 2016-04-19 DIAGNOSIS — E038 Other specified hypothyroidism: Secondary | ICD-10-CM | POA: Diagnosis not present

## 2016-04-19 DIAGNOSIS — I7300 Raynaud's syndrome without gangrene: Secondary | ICD-10-CM | POA: Diagnosis not present

## 2016-04-19 DIAGNOSIS — Z6834 Body mass index (BMI) 34.0-34.9, adult: Secondary | ICD-10-CM | POA: Diagnosis not present

## 2016-04-19 DIAGNOSIS — D126 Benign neoplasm of colon, unspecified: Secondary | ICD-10-CM | POA: Diagnosis not present

## 2016-04-19 DIAGNOSIS — M25561 Pain in right knee: Secondary | ICD-10-CM | POA: Diagnosis not present

## 2016-05-23 DIAGNOSIS — M4692 Unspecified inflammatory spondylopathy, cervical region: Secondary | ICD-10-CM | POA: Diagnosis not present

## 2016-05-23 DIAGNOSIS — M898X1 Other specified disorders of bone, shoulder: Secondary | ICD-10-CM | POA: Diagnosis not present

## 2016-05-28 DIAGNOSIS — M542 Cervicalgia: Secondary | ICD-10-CM | POA: Diagnosis not present

## 2016-05-28 DIAGNOSIS — Z6822 Body mass index (BMI) 22.0-22.9, adult: Secondary | ICD-10-CM | POA: Diagnosis not present

## 2016-06-08 DIAGNOSIS — M542 Cervicalgia: Secondary | ICD-10-CM | POA: Diagnosis not present

## 2016-06-08 DIAGNOSIS — R262 Difficulty in walking, not elsewhere classified: Secondary | ICD-10-CM | POA: Diagnosis not present

## 2016-06-15 DIAGNOSIS — R262 Difficulty in walking, not elsewhere classified: Secondary | ICD-10-CM | POA: Diagnosis not present

## 2016-06-15 DIAGNOSIS — M542 Cervicalgia: Secondary | ICD-10-CM | POA: Diagnosis not present

## 2016-06-16 ENCOUNTER — Other Ambulatory Visit: Payer: Self-pay | Admitting: Endocrinology

## 2016-06-16 DIAGNOSIS — Z1231 Encounter for screening mammogram for malignant neoplasm of breast: Secondary | ICD-10-CM

## 2016-06-17 DIAGNOSIS — R262 Difficulty in walking, not elsewhere classified: Secondary | ICD-10-CM | POA: Diagnosis not present

## 2016-06-17 DIAGNOSIS — M542 Cervicalgia: Secondary | ICD-10-CM | POA: Diagnosis not present

## 2016-06-21 DIAGNOSIS — M542 Cervicalgia: Secondary | ICD-10-CM | POA: Diagnosis not present

## 2016-06-21 DIAGNOSIS — R262 Difficulty in walking, not elsewhere classified: Secondary | ICD-10-CM | POA: Diagnosis not present

## 2016-06-24 DIAGNOSIS — R262 Difficulty in walking, not elsewhere classified: Secondary | ICD-10-CM | POA: Diagnosis not present

## 2016-06-24 DIAGNOSIS — M542 Cervicalgia: Secondary | ICD-10-CM | POA: Diagnosis not present

## 2016-06-29 DIAGNOSIS — M542 Cervicalgia: Secondary | ICD-10-CM | POA: Diagnosis not present

## 2016-06-29 DIAGNOSIS — R262 Difficulty in walking, not elsewhere classified: Secondary | ICD-10-CM | POA: Diagnosis not present

## 2016-07-01 DIAGNOSIS — R262 Difficulty in walking, not elsewhere classified: Secondary | ICD-10-CM | POA: Diagnosis not present

## 2016-07-01 DIAGNOSIS — M542 Cervicalgia: Secondary | ICD-10-CM | POA: Diagnosis not present

## 2016-07-06 ENCOUNTER — Other Ambulatory Visit: Payer: Self-pay | Admitting: Cardiovascular Disease

## 2016-07-06 DIAGNOSIS — R262 Difficulty in walking, not elsewhere classified: Secondary | ICD-10-CM | POA: Diagnosis not present

## 2016-07-06 DIAGNOSIS — M542 Cervicalgia: Secondary | ICD-10-CM | POA: Diagnosis not present

## 2016-07-08 DIAGNOSIS — R262 Difficulty in walking, not elsewhere classified: Secondary | ICD-10-CM | POA: Diagnosis not present

## 2016-07-08 DIAGNOSIS — M542 Cervicalgia: Secondary | ICD-10-CM | POA: Diagnosis not present

## 2016-07-13 DIAGNOSIS — M542 Cervicalgia: Secondary | ICD-10-CM | POA: Diagnosis not present

## 2016-07-13 DIAGNOSIS — R262 Difficulty in walking, not elsewhere classified: Secondary | ICD-10-CM | POA: Diagnosis not present

## 2016-07-15 DIAGNOSIS — R262 Difficulty in walking, not elsewhere classified: Secondary | ICD-10-CM | POA: Diagnosis not present

## 2016-07-15 DIAGNOSIS — M542 Cervicalgia: Secondary | ICD-10-CM | POA: Diagnosis not present

## 2016-07-20 ENCOUNTER — Ambulatory Visit
Admission: RE | Admit: 2016-07-20 | Discharge: 2016-07-20 | Disposition: A | Discharge status: Home / Self Care | Payer: Medicare Other | Source: Ambulatory Visit | Attending: Endocrinology | Admitting: Endocrinology

## 2016-07-20 DIAGNOSIS — Z6834 Body mass index (BMI) 34.0-34.9, adult: Secondary | ICD-10-CM | POA: Diagnosis not present

## 2016-07-20 DIAGNOSIS — L84 Corns and callosities: Secondary | ICD-10-CM | POA: Diagnosis not present

## 2016-07-20 DIAGNOSIS — Z1231 Encounter for screening mammogram for malignant neoplasm of breast: Secondary | ICD-10-CM | POA: Diagnosis not present

## 2016-07-27 DIAGNOSIS — R262 Difficulty in walking, not elsewhere classified: Secondary | ICD-10-CM | POA: Diagnosis not present

## 2016-07-27 DIAGNOSIS — M542 Cervicalgia: Secondary | ICD-10-CM | POA: Diagnosis not present

## 2016-08-03 DIAGNOSIS — R262 Difficulty in walking, not elsewhere classified: Secondary | ICD-10-CM | POA: Diagnosis not present

## 2016-08-03 DIAGNOSIS — M542 Cervicalgia: Secondary | ICD-10-CM | POA: Diagnosis not present

## 2016-08-09 DIAGNOSIS — R262 Difficulty in walking, not elsewhere classified: Secondary | ICD-10-CM | POA: Diagnosis not present

## 2016-08-09 DIAGNOSIS — M542 Cervicalgia: Secondary | ICD-10-CM | POA: Diagnosis not present

## 2016-08-16 DIAGNOSIS — Z6834 Body mass index (BMI) 34.0-34.9, adult: Secondary | ICD-10-CM | POA: Diagnosis not present

## 2016-08-16 DIAGNOSIS — L84 Corns and callosities: Secondary | ICD-10-CM | POA: Diagnosis not present

## 2016-08-16 DIAGNOSIS — E784 Other hyperlipidemia: Secondary | ICD-10-CM | POA: Diagnosis not present

## 2016-08-16 DIAGNOSIS — E1149 Type 2 diabetes mellitus with other diabetic neurological complication: Secondary | ICD-10-CM | POA: Diagnosis not present

## 2016-08-16 DIAGNOSIS — F411 Generalized anxiety disorder: Secondary | ICD-10-CM | POA: Diagnosis not present

## 2016-08-16 DIAGNOSIS — E1142 Type 2 diabetes mellitus with diabetic polyneuropathy: Secondary | ICD-10-CM | POA: Diagnosis not present

## 2016-08-16 DIAGNOSIS — E559 Vitamin D deficiency, unspecified: Secondary | ICD-10-CM | POA: Diagnosis not present

## 2016-08-16 DIAGNOSIS — E038 Other specified hypothyroidism: Secondary | ICD-10-CM | POA: Diagnosis not present

## 2016-08-16 DIAGNOSIS — N08 Glomerular disorders in diseases classified elsewhere: Secondary | ICD-10-CM | POA: Diagnosis not present

## 2016-08-19 DIAGNOSIS — M542 Cervicalgia: Secondary | ICD-10-CM | POA: Diagnosis not present

## 2016-08-19 DIAGNOSIS — R262 Difficulty in walking, not elsewhere classified: Secondary | ICD-10-CM | POA: Diagnosis not present

## 2016-08-24 DIAGNOSIS — R262 Difficulty in walking, not elsewhere classified: Secondary | ICD-10-CM | POA: Diagnosis not present

## 2016-08-24 DIAGNOSIS — M542 Cervicalgia: Secondary | ICD-10-CM | POA: Diagnosis not present

## 2016-09-23 DIAGNOSIS — L820 Inflamed seborrheic keratosis: Secondary | ICD-10-CM | POA: Diagnosis not present

## 2016-09-23 DIAGNOSIS — L821 Other seborrheic keratosis: Secondary | ICD-10-CM | POA: Diagnosis not present

## 2016-09-23 DIAGNOSIS — L719 Rosacea, unspecified: Secondary | ICD-10-CM | POA: Diagnosis not present

## 2016-09-23 DIAGNOSIS — Z23 Encounter for immunization: Secondary | ICD-10-CM | POA: Diagnosis not present

## 2016-09-23 DIAGNOSIS — D485 Neoplasm of uncertain behavior of skin: Secondary | ICD-10-CM | POA: Diagnosis not present

## 2016-09-23 DIAGNOSIS — L570 Actinic keratosis: Secondary | ICD-10-CM | POA: Diagnosis not present

## 2016-09-27 DIAGNOSIS — C44622 Squamous cell carcinoma of skin of right upper limb, including shoulder: Secondary | ICD-10-CM | POA: Diagnosis not present

## 2016-10-19 DIAGNOSIS — C4492 Squamous cell carcinoma of skin, unspecified: Secondary | ICD-10-CM | POA: Diagnosis not present

## 2016-10-28 DIAGNOSIS — Z5189 Encounter for other specified aftercare: Secondary | ICD-10-CM | POA: Diagnosis not present

## 2016-12-01 ENCOUNTER — Other Ambulatory Visit: Payer: Self-pay | Admitting: *Deleted

## 2016-12-01 MED ORDER — HYDROCHLOROTHIAZIDE 12.5 MG PO CAPS: 12.5000 mg | ORAL_CAPSULE | Freq: Every day | ORAL | 3 refills | Status: DC

## 2017-01-04 DIAGNOSIS — L84 Corns and callosities: Secondary | ICD-10-CM | POA: Diagnosis not present

## 2017-01-04 DIAGNOSIS — Z1389 Encounter for screening for other disorder: Secondary | ICD-10-CM | POA: Diagnosis not present

## 2017-01-04 DIAGNOSIS — E669 Obesity, unspecified: Secondary | ICD-10-CM | POA: Diagnosis not present

## 2017-01-04 DIAGNOSIS — Z6836 Body mass index (BMI) 36.0-36.9, adult: Secondary | ICD-10-CM | POA: Diagnosis not present

## 2017-01-04 DIAGNOSIS — I10 Essential (primary) hypertension: Secondary | ICD-10-CM | POA: Diagnosis not present

## 2017-01-04 DIAGNOSIS — E038 Other specified hypothyroidism: Secondary | ICD-10-CM | POA: Diagnosis not present

## 2017-01-04 DIAGNOSIS — E1142 Type 2 diabetes mellitus with diabetic polyneuropathy: Secondary | ICD-10-CM | POA: Diagnosis not present

## 2017-01-04 DIAGNOSIS — E784 Other hyperlipidemia: Secondary | ICD-10-CM | POA: Diagnosis not present

## 2017-01-04 DIAGNOSIS — N08 Glomerular disorders in diseases classified elsewhere: Secondary | ICD-10-CM | POA: Diagnosis not present

## 2017-01-04 DIAGNOSIS — I5189 Other ill-defined heart diseases: Secondary | ICD-10-CM | POA: Diagnosis not present

## 2017-01-04 DIAGNOSIS — D126 Benign neoplasm of colon, unspecified: Secondary | ICD-10-CM | POA: Diagnosis not present

## 2017-01-04 DIAGNOSIS — E1149 Type 2 diabetes mellitus with other diabetic neurological complication: Secondary | ICD-10-CM | POA: Diagnosis not present

## 2017-02-28 ENCOUNTER — Encounter: Payer: Self-pay | Admitting: Cardiovascular Disease

## 2017-02-28 ENCOUNTER — Ambulatory Visit (INDEPENDENT_AMBULATORY_CARE_PROVIDER_SITE_OTHER): Payer: Medicare Other | Admitting: Cardiovascular Disease

## 2017-02-28 VITALS — BP 156/78 | HR 75 | Ht 68.0 in | Wt 240.0 lb

## 2017-02-28 DIAGNOSIS — E668 Other obesity: Secondary | ICD-10-CM

## 2017-02-28 DIAGNOSIS — I10 Essential (primary) hypertension: Secondary | ICD-10-CM | POA: Diagnosis not present

## 2017-02-28 DIAGNOSIS — M25473 Effusion, unspecified ankle: Secondary | ICD-10-CM | POA: Diagnosis not present

## 2017-02-28 DIAGNOSIS — E785 Hyperlipidemia, unspecified: Secondary | ICD-10-CM

## 2017-02-28 MED ORDER — HYDROCHLOROTHIAZIDE 25 MG PO TABS: 25.0000 mg | ORAL_TABLET | Freq: Every day | ORAL | 3 refills | Status: DC

## 2017-02-28 MED ORDER — CARVEDILOL 6.25 MG PO TABS: 9.3750 mg | ORAL_TABLET | Freq: Two times a day (BID) | ORAL | 3 refills | Status: DC

## 2017-02-28 NOTE — Patient Instructions (Signed)
Medication Instructions:   INCREASE CARVEDILOL TO 9.375 MG TWICE DAILY= 1 AND 1/2 OF THE 6.25 MG TABLET TWICE DAILY  INCREASE HCTX TO 25 MG ONCE DAILY= 2 OF THE 12.5 MG TABLETS ONCE DAILY  Labwork:  Your physician recommends that you return for lab work WHEN FASTING  Follow-Up:  Your physician wants you to follow-up in: [place] will receive a reminder letter in the mail two months in advance. If you don't receive a letter, please call our office to schedule the follow-up appointment.   If you need a refill on your cardiac medications before your next appointment, please call your pharmacy.

## 2017-02-28 NOTE — Progress Notes (Signed)
Patient ID: Pamela [name], female   DOB: 12/04/1945, 71 y.o.   MRN: [id number]    HPI: Pamela [name] is a 71 y.o. female who presents 24 month followup cardiology evaluation.  Pamela [name] has a long-standing history of obesity, hypertension, type 2 diabetes mellitus, and hyperlipidemia. A Lexiscan Myoview study in March 2014  done for evaluation of chest pain revealed normal perfusion without scar or ischemia. Post-rest ejection fraction was 74%.  She has a history of obesity and when I had seen her in 2013 she had lost approximaltey 20 pounds. She has a history of hyperlipidemia and had been started on lipid-lowering therapy when her NMR lipoprotein LDL particle number was 1834 and LDL of 110, small LDL particles of 1083 and triglycerides of 176. Initially she was taking Crestor 10 mg daily but developed myalgias and reduced the dose to every other day.  She is no longer taking statin therapy.  She is followed by Dr. Steve South.  Since I last saw her 2 years ago, she denies any episodes of chest pain.  She  is inactive and does not exercise.  She has mild  shortness of breath with activity.  At times there is mild ankle swelling.  She denies chest pressure.  She is unaware of palpitations.  In July 2017, she underwent a five-year follow-up echo Doppler study which showed an EF of 60-65%.  There was mild LVH and grade 1 diastolic dysfunction.  She had mildly thickened mitral valve leaflets with trace to mild MR.  She has been taking since levothyroxine 137 g daily .  Hypothyroidism.  For hypertension she is on carvedilol 6.25 twice a day. HCTZ 12.5 mg, and lisinopril 40 mg daily.  She is diabetic and is now onJentadueto XR 2.01/999 and glimepiride..  She continues to take baby aspirin for antiplatelet benefit.  She presents for evaluation.  Past Medical History:  Diagnosis Date  . Diabetes mellitus (HCC)   . GERD (gastroesophageal reflux disease)   . Heart murmur   . Hyperlipidemia   . Hypertension     . Thyroid disease    hypo    Past Surgical History:  Procedure Laterality Date  . ABDOMINAL HYSTERECTOMY    . CHOLECYSTECTOMY    . COLONOSCOPY    . TONSILLECTOMY      Allergies  Allergen Reactions  . Demerol [Meperidine] Other (See Comments)    Unspecified   . Macrobid [Nitrofurantoin Macrocrystal] Other (See Comments)    Unspecified   . Meclizine Other (See Comments)    Unspecified   . Sulfa Antibiotics Other (See Comments)    Unspecified     Current Outpatient Prescriptions  Medication Sig Dispense Refill  . aspirin 81 MG tablet Take 325 mg by mouth daily.     . carvedilol (COREG) 6.25 MG tablet Take 1.5 tablets (9.375 mg total) by mouth 2 (two) times daily with a meal. 270 tablet 3  . Coenzyme Q10 (CO Q 10) 100 MG CAPS Take 200 mg by mouth daily.    . glimepiride (AMARYL) 2 MG tablet Take 2 mg by mouth daily before breakfast.    . hydrochlorothiazide (HYDRODIURIL) 25 MG tablet Take 1 tablet (25 mg total) by mouth daily. 90 tablet 3  . levothyroxine (SYNTHROID, LEVOTHROID) 137 MCG tablet Take 137 mcg by mouth daily before breakfast. One daily, 1 and 1/2 on Sunday    . Linagliptin-Metformin HCl ER (JENTADUETO XR) 2.01-999 MG TB24 Take 1 tablet by mouth every morning.    .   lisinopril (PRINIVIL,ZESTRIL) 40 MG tablet Take 40 mg by mouth daily.    . Potassium Gluconate 595 MG CAPS Take 1 capsule by mouth daily.     No current facility-administered medications for this visit.     Socially she is married to she has one child. There is no tobacco or alcohol use.  ROS General: Negative; No fevers, chills, or night sweats;  HEENT: Negative; No changes in vision or hearing, sinus congestion, difficulty swallowing Pulmonary: Negative; No cough, wheezing, shortness of breath, hemoptysis Cardiovascular: Negative; No chest pain, presyncope, syncope, palpitations Occasional leg swelling GI: Negative; No nausea, vomiting, diarrhea, or abdominal pain GU: Negative; No dysuria,  hematuria, or difficulty voiding Musculoskeletal: Negative; no myalgias, joint pain, or weakness Hematologic/Oncology: Negative; no easy bruising, bleeding Endocrine: Negative; no heat/cold intolerance; no diabetes Neuro: Negative; no changes in balance, headaches Skin: Negative; No rashes or skin lesions Psychiatric: Negative; No behavioral problems, depression Sleep: Negative; No snoring, daytime sleepiness, hypersomnolence, bruxism, restless legs, hypnogognic hallucinations, no cataplexy Other comprehensive 14 point system review is negative.   PE BP (!) 156/78   Pulse 75   Ht 5' 8" (1.727 m)   Wt 240 lb (108.9 kg)   BMI 36.49 kg/m     Repeat blood pressure by me was 144/80.  Wt Readings from Last 3 Encounters:  02/28/17 240 lb (108.9 kg)  03/12/16 229 lb 12.8 oz (104.2 kg)  05/05/15 235 lb (106.6 kg)     Physical Exam BP (!) 156/78   Pulse 75   Ht 5' 8" (1.727 m)   Wt 240 lb (108.9 kg)   BMI 36.49 kg/m  General: Alert, oriented, no distress.  Skin: normal turgor, no rashes, warm and dry HEENT: Normocephalic, atraumatic. Pupils equal round and reactive to light; sclera anicteric; extraocular muscles intact;  Nose without nasal septal hypertrophy Mouth/Parynx benign; Mallinpatti scale 3 Neck: No JVD, no carotid bruits; normal carotid upstroke Lungs: clear to ausculatation and percussion; no wheezing or rales Chest wall: without tenderness to palpitation Heart: PMI not displaced, RRR, s1 s2 normal, 1/6 systolic murmur, no diastolic murmur, no rubs, gallops, thrills, or heaves Abdomen: Moderate central adiposity; soft, nontender; no hepatosplenomehaly, BS+; abdominal aorta nontender and not dilated by palpation. Back: no CVA tenderness Pulses 2+ Musculoskeletal: full range of motion, normal strength, no joint deformities Extremities: Trace bilateral ankle edema;no clubbing cyanosis, Homan's sign negative  Neurologic: grossly nonfocal; Cranial nerves grossly  wnl Psychologic: Normal mood and affect   ECG (independently read by me): Sinus rhythm with sinus arrhythmia and quadrigeminal PVCs.  QTc interval 410 ms.  No significant ST segment change.  June 2016 ECG (independently read by me): Normal sinus rhythm at 73 bpm.  No ectopy.  Normal intervals.  December 2015 ECG (independently read by me): Normal sinus rhythm at 65 bpm.  No ectopy.  Normal intervals.  No ST segment changes.    July 2014 ECG: Normal sinus rhythm at 63 beats per minute. Normal intervals.  LABS:  BMP Latest Ref Rng & Units 09/26/2014 04/17/2013 06/01/2009  Glucose 70 - 99 mg/dL 239(H) 172(H) 165(H)  BUN 6 - 23 mg/dL 19 11 11  Creatinine 0.50 - 1.10 mg/dL 0.69 0.64 0.60  Sodium 135 - 145 mEq/L 137 139 141  Potassium 3.5 - 5.3 mEq/L 4.2 4.4 3.9  Chloride 96 - 112 mEq/L 102 105 107  CO2 19 - 32 mEq/L 27 26 24  Calcium 8.4 - 10.5 mg/dL 9.0 10.1 9.4   Hepatic Function Latest Ref   Rng & Units 04/17/2013 05/31/2009  Total Protein 6.0 - 8.3 g/dL 6.8 6.5  Albumin 3.5 - 5.2 g/dL 3.9 3.4(L)  AST 0 - 37 U/L 25 26  ALT 0 - 35 U/L 22 24  Alk Phosphatase 39 - 117 U/L 67 76  Total Bilirubin 0.3 - 1.2 mg/dL 0.4 0.6   CBC Latest Ref Rng & Units 06/02/2009 06/01/2009 05/31/2009  WBC 4.0 - 10.5 K/uL 11.0(H) 9.6 11.1(H)  Hemoglobin 12.0 - 15.0 g/dL 12.0 12.8 12.0  Hematocrit 36.0 - 46.0 % 35.7(L) 38.4 35.6(L)  Platelets 150 - 400 K/uL 285 310 291   Lab Results  Component Value Date   MCV 86.4 06/02/2009   MCV 86.1 06/01/2009   MCV 86.7 05/31/2009    Lab done on 11/14/2014 at [facility] by Dr. Stephen South.  Glucose was increased at 258.  Total cholesterol 213, triglycerides 207, HDL 59, LDL 113.  LFTs normal.  BUN 19, creatinine 0.8.  I will try to obtain her most recent laboratory from December 2017.  RADIOLOGY: No results found.  IMPRESSION:  1. Hyperlipidemia with target LDL less than 70   2. Essential hypertension   3. Moderate obesity   4. Ankle swelling,  unspecified laterality     ASSESSMENT AND PLAN:  Pamela [name] is a 70 -year-old female who has a history of moderate obesity, hypertension, type 2 diabetes mellitus, and hypothyroidism.  Her blood pressure today is elevated on her current regimen of lisinopril 40 mg, carvedilol 6.25 mg twice a day, and HCTZ 12.5 mg.  I discussed with her the new hypertensive guidelines.  She is diabetic and target systolic blood pressures less than 1:30.  Her ECG reveals occasional PVCs in a quadrigeminal rhythm.  She has ankle swelling bilaterally.  I am recommending she increase hydrochlorothiazide to 25 mg daily.  I will further titrate carvedilol to 9.375 mg twice a day.  This should be helpful both for ectopy control, edema, as well as improved blood pressure management.  Her hypothyroidism is followed by Dr. South and she is on levothyroxine at 137 g daily.  She now is on a diabetic regimen consisting of Jentadueto XR and glimepiride.  I have recommended a follow-up Cmet, CBC, and lipid panel.  Target LDL is less than 70 in this diabetic female.  She is moderately obese and has gained weight up to 240 with a BMI of 36.49.  I discussed the importance of increased exercise and weight loss.  She was seen Dr. South in August.  I will see her in 6 months for cardiology reevaluation.  Time spent: 25 minutes  Thomas A. Kelly, MD, FACC  02/28/2017 5:38 PM    

## 2017-03-01 DIAGNOSIS — I10 Essential (primary) hypertension: Secondary | ICD-10-CM | POA: Diagnosis not present

## 2017-03-02 LAB — COMPREHENSIVE METABOLIC PANEL
ALBUMIN: 3.9 g/dL (ref 3.5–4.8)
ALK PHOS: 75 IU/L (ref 39–117)
ALT: 15 IU/L (ref 0–32)
AST: 17 IU/L (ref 0–40)
Albumin/Globulin Ratio: 1.5 (ref 1.2–2.2)
BUN / CREAT RATIO: 22 (ref 12–28)
BUN: 17 mg/dL (ref 8–27)
Bilirubin Total: 0.2 mg/dL (ref 0.0–1.2)
CO2: 20 mmol/L (ref 20–29)
CREATININE: 0.78 mg/dL (ref 0.57–1.00)
Calcium: 9.4 mg/dL (ref 8.7–10.3)
Chloride: 100 mmol/L (ref 96–106)
GFR calc non Af Amer: 77 mL/min/{1.73_m2} (ref >59)
GFR, EST AFRICAN AMERICAN: 88 mL/min/{1.73_m2} (ref >59)
Globulin, Total: 2.6 g/dL (ref 1.5–4.5)
Glucose: 181 mg/dL — ABNORMAL HIGH (ref 65–99)
Potassium: 4.5 mmol/L (ref 3.5–5.2)
Sodium: 137 mmol/L (ref 134–144)
TOTAL PROTEIN: 6.5 g/dL (ref 6.0–8.5)

## 2017-03-02 LAB — CBC
HEMOGLOBIN: 10.3 g/dL — AB (ref 11.1–15.9)
Hematocrit: 33.1 % — ABNORMAL LOW (ref 34.0–46.6)
MCH: 25.4 pg — AB (ref 26.6–33.0)
MCHC: 31.1 g/dL — ABNORMAL LOW (ref 31.5–35.7)
MCV: 82 fL (ref 79–97)
Platelets: 374 10*3/uL (ref 150–379)
RBC: 4.06 x10E6/uL (ref 3.77–5.28)
RDW: 16.1 % — ABNORMAL HIGH (ref 12.3–15.4)
WBC: 10.3 10*3/uL (ref 3.4–10.8)

## 2017-03-02 LAB — LIPID PANEL
CHOLESTEROL TOTAL: 177 mg/dL (ref 100–199)
Chol/HDL Ratio: 2.9 ratio (ref 0.0–4.4)
HDL: 62 mg/dL (ref >39)
LDL CALC: 89 mg/dL (ref 0–99)
Triglycerides: 130 mg/dL (ref 0–149)
VLDL CHOLESTEROL CAL: 26 mg/dL (ref 5–40)

## 2017-03-28 ENCOUNTER — Encounter: Payer: Self-pay | Admitting: *Deleted

## 2017-03-28 DIAGNOSIS — H2511 Age-related nuclear cataract, right eye: Secondary | ICD-10-CM | POA: Diagnosis not present

## 2017-03-28 DIAGNOSIS — E119 Type 2 diabetes mellitus without complications: Secondary | ICD-10-CM | POA: Diagnosis not present

## 2017-03-28 DIAGNOSIS — H43822 Vitreomacular adhesion, left eye: Secondary | ICD-10-CM | POA: Diagnosis not present

## 2017-03-28 DIAGNOSIS — H2512 Age-related nuclear cataract, left eye: Secondary | ICD-10-CM | POA: Diagnosis not present

## 2017-03-28 DIAGNOSIS — H43821 Vitreomacular adhesion, right eye: Secondary | ICD-10-CM | POA: Diagnosis not present

## 2017-04-13 ENCOUNTER — Ambulatory Visit (INDEPENDENT_AMBULATORY_CARE_PROVIDER_SITE_OTHER): Payer: Medicare Other | Admitting: Podiatry

## 2017-04-13 ENCOUNTER — Encounter: Payer: Self-pay | Admitting: Podiatry

## 2017-04-13 VITALS — BP 156/73 | HR 73 | Resp 18

## 2017-04-13 DIAGNOSIS — E1151 Type 2 diabetes mellitus with diabetic peripheral angiopathy without gangrene: Secondary | ICD-10-CM | POA: Diagnosis not present

## 2017-04-13 DIAGNOSIS — L84 Corns and callosities: Secondary | ICD-10-CM

## 2017-04-13 NOTE — Progress Notes (Signed)
   Subjective:    Patient ID: Pamela [name], female    DOB: 19-Jul-1946, 71 y.o.   MRN: [id number]  HPI   A she presents today complaining of a painful callus on the plantar aspect of her left hallux present for multiple months with a history of debridement by her internist. Lesion is recurrent and chronic. Patient also noticed a mild bluish discolored station the dorsal right first MPJ the last several weeks. There is no progression in size or discomfort in this area  Patient is diabetic and denies history of ulceration, claudication or amputation Patient denies smoking history   Review of Systems  Skin:       My right big toe is hurting not sure if it is the nail        Objective:   Physical Exam  Patient states she is approximately 5 foot 8 inches and weighs approximately 235 pounds  Orientated 3  Vascular: DP pulses are 2/4 bilaterally PT pulses 1/4 bilaterally Capillary reflex within normal limits bilaterally  Neurological: Sensation to 10 g monofilament wire intact 8/9 bilaterally Vibratory sensation reactive bilaterally Ankle reflexes reactive bilaterally  Dermatological: No open skin lesions bilaterally Nucleated plantar keratoses left hallux Bluish fluctuant lesion approximately 10 mm dorsal right first MPJ. Lesion is compressed and not palpable and reduces. Seems to be more consistent with adjacent pain in the area  Musculoskeletal: Atrophic fad pad MPJ and heel bilaterally There is no restriction ankle, subtalar, midtarsal joints bilaterally Manual motor testing dorsi flexion, plantar flexion, eversion, inversion 5/5 bilaterally       Assessment & Plan:   Assessment: Diabetic with decreased posterior tibial pulses bilaterally suggested of peripheral arterial disease No history of open wounds or claudication will maintain surveillance Probable varicosity dorsal right first MPJ and these no active treatment at this time Chronic plantar nucleated  porokeratosis left hallux  Plan: Today I reviewed the results of exam with patient today The plantar skin lesion was debrided without any bleeding Attach felt pad with cut out old offload plantar callus to patient's existing insole  Reappoint at patient's request or yearly

## 2017-04-13 NOTE — Patient Instructions (Signed)

## 2017-04-19 DIAGNOSIS — R351 Nocturia: Secondary | ICD-10-CM | POA: Diagnosis not present

## 2017-04-19 DIAGNOSIS — R350 Frequency of micturition: Secondary | ICD-10-CM | POA: Diagnosis not present

## 2017-04-20 DIAGNOSIS — R350 Frequency of micturition: Secondary | ICD-10-CM | POA: Diagnosis not present

## 2017-05-04 DIAGNOSIS — Z1389 Encounter for screening for other disorder: Secondary | ICD-10-CM | POA: Diagnosis not present

## 2017-05-04 DIAGNOSIS — Z6836 Body mass index (BMI) 36.0-36.9, adult: Secondary | ICD-10-CM | POA: Diagnosis not present

## 2017-05-04 DIAGNOSIS — I10 Essential (primary) hypertension: Secondary | ICD-10-CM | POA: Diagnosis not present

## 2017-05-04 DIAGNOSIS — E784 Other hyperlipidemia: Secondary | ICD-10-CM | POA: Diagnosis not present

## 2017-05-04 DIAGNOSIS — L84 Corns and callosities: Secondary | ICD-10-CM | POA: Diagnosis not present

## 2017-05-04 DIAGNOSIS — E038 Other specified hypothyroidism: Secondary | ICD-10-CM | POA: Diagnosis not present

## 2017-05-04 DIAGNOSIS — I5189 Other ill-defined heart diseases: Secondary | ICD-10-CM | POA: Diagnosis not present

## 2017-05-04 DIAGNOSIS — E1149 Type 2 diabetes mellitus with other diabetic neurological complication: Secondary | ICD-10-CM | POA: Diagnosis not present

## 2017-05-04 DIAGNOSIS — D126 Benign neoplasm of colon, unspecified: Secondary | ICD-10-CM | POA: Diagnosis not present

## 2017-05-04 DIAGNOSIS — E1142 Type 2 diabetes mellitus with diabetic polyneuropathy: Secondary | ICD-10-CM | POA: Diagnosis not present

## 2017-05-04 DIAGNOSIS — E559 Vitamin D deficiency, unspecified: Secondary | ICD-10-CM | POA: Diagnosis not present

## 2017-06-09 DIAGNOSIS — Z780 Asymptomatic menopausal state: Secondary | ICD-10-CM | POA: Diagnosis not present

## 2017-06-09 DIAGNOSIS — Z01419 Encounter for gynecological examination (general) (routine) without abnormal findings: Secondary | ICD-10-CM | POA: Diagnosis not present

## 2017-06-16 DIAGNOSIS — E119 Type 2 diabetes mellitus without complications: Secondary | ICD-10-CM | POA: Diagnosis not present

## 2017-06-16 DIAGNOSIS — H04123 Dry eye syndrome of bilateral lacrimal glands: Secondary | ICD-10-CM | POA: Diagnosis not present

## 2017-06-16 DIAGNOSIS — H25013 Cortical age-related cataract, bilateral: Secondary | ICD-10-CM | POA: Diagnosis not present

## 2017-06-16 DIAGNOSIS — H2513 Age-related nuclear cataract, bilateral: Secondary | ICD-10-CM | POA: Diagnosis not present

## 2017-06-16 DIAGNOSIS — H35363 Drusen (degenerative) of macula, bilateral: Secondary | ICD-10-CM | POA: Diagnosis not present

## 2017-06-20 ENCOUNTER — Ambulatory Visit: Payer: Medicare Other | Admitting: Podiatry

## 2017-06-28 DIAGNOSIS — L821 Other seborrheic keratosis: Secondary | ICD-10-CM | POA: Diagnosis not present

## 2017-06-28 DIAGNOSIS — D1801 Hemangioma of skin and subcutaneous tissue: Secondary | ICD-10-CM | POA: Diagnosis not present

## 2017-06-28 DIAGNOSIS — D225 Melanocytic nevi of trunk: Secondary | ICD-10-CM | POA: Diagnosis not present

## 2017-06-28 DIAGNOSIS — Z85828 Personal history of other malignant neoplasm of skin: Secondary | ICD-10-CM | POA: Diagnosis not present

## 2017-06-28 DIAGNOSIS — L814 Other melanin hyperpigmentation: Secondary | ICD-10-CM | POA: Diagnosis not present

## 2017-06-28 DIAGNOSIS — Z23 Encounter for immunization: Secondary | ICD-10-CM | POA: Diagnosis not present

## 2017-06-28 DIAGNOSIS — L719 Rosacea, unspecified: Secondary | ICD-10-CM | POA: Diagnosis not present

## 2017-06-28 DIAGNOSIS — L720 Epidermal cyst: Secondary | ICD-10-CM | POA: Diagnosis not present

## 2017-07-05 ENCOUNTER — Encounter: Payer: Self-pay | Admitting: Podiatry

## 2017-07-05 ENCOUNTER — Ambulatory Visit (INDEPENDENT_AMBULATORY_CARE_PROVIDER_SITE_OTHER): Payer: Medicare Other | Admitting: Podiatry

## 2017-07-05 DIAGNOSIS — E1151 Type 2 diabetes mellitus with diabetic peripheral angiopathy without gangrene: Secondary | ICD-10-CM | POA: Diagnosis not present

## 2017-07-05 DIAGNOSIS — B351 Tinea unguium: Secondary | ICD-10-CM

## 2017-07-05 DIAGNOSIS — L84 Corns and callosities: Secondary | ICD-10-CM | POA: Diagnosis not present

## 2017-07-05 NOTE — Patient Instructions (Signed)
Remove the Band-Aid on the bottom left foot in 1-2 days and apply topical antibiotic ointment and Band-Aid daily until a scab forms  Diabetes and Foot Care Diabetes may cause you to have problems because of poor blood supply (circulation) to your feet and legs. This may cause the skin on your feet to become thinner, break easier, and heal more slowly. Your skin may become dry, and the skin may peel and crack. You may also have nerve damage in your legs and feet causing decreased feeling in them. You may not notice minor injuries to your feet that could lead to infections or more serious problems. Taking care of your feet is one of the most important things you can do for yourself. Follow these instructions at home:  Wear shoes at all times, even in the house. Do not go barefoot. Bare feet are easily injured.  Check your feet daily for blisters, cuts, and redness. If you cannot see the bottom of your feet, use a mirror or ask someone for help.  Wash your feet with warm water (do not use hot water) and mild soap. Then pat your feet and the areas between your toes until they are completely dry. Do not soak your feet as this can dry your skin.  Apply a moisturizing lotion or petroleum jelly (that does not contain alcohol and is unscented) to the skin on your feet and to dry, brittle toenails. Do not apply lotion between your toes.  Trim your toenails straight across. Do not dig under them or around the cuticle. File the edges of your nails with an emery board or nail file.  Do not cut corns or calluses or try to remove them with medicine.  Wear clean socks or stockings every day. Make sure they are not too tight. Do not wear knee-high stockings since they may decrease blood flow to your legs.  Wear shoes that fit properly and have enough cushioning. To break in new shoes, wear them for just a few hours a day. This prevents you from injuring your feet. Always look in your shoes before you put them on  to be sure there are no objects inside.  Do not cross your legs. This may decrease the blood flow to your feet.  If you find a minor scrape, cut, or break in the skin on your feet, keep it and the skin around it clean and dry. These areas may be cleansed with mild soap and water. Do not cleanse the area with peroxide, alcohol, or iodine.  When you remove an adhesive bandage, be sure not to damage the skin around it.  If you have a wound, look at it several times a day to make sure it is healing.  Do not use heating pads or hot water bottles. They may burn your skin. If you have lost feeling in your feet or legs, you may not know it is happening until it is too late.  Make sure your health care provider performs a complete foot exam at least annually or more often if you have foot problems. Report any cuts, sores, or bruises to your health care provider immediately. Contact a health care provider if:  You have an injury that is not healing.  You have cuts or breaks in the skin.  You have an ingrown nail.  You notice redness on your legs or feet.  You feel burning or tingling in your legs or feet.  You have pain or cramps in your legs  and feet.  Your legs or feet are numb.  Your feet always feel cold. Get help right away if:  There is increasing redness, swelling, or pain in or around a wound.  There is a red line that goes up your leg.  Pus is coming from a wound.  You develop a fever or as directed by your health care provider.  You notice a bad smell coming from an ulcer or wound. This information is not intended to replace advice given to you by your health care provider. Make sure you discuss any questions you have with your health care provider. Document Released: 08/27/2000 Document Revised: 02/05/2016 Document Reviewed: 02/06/2013 Elsevier Interactive Patient Education  2017 [employer].

## 2017-07-05 NOTE — Progress Notes (Signed)
Patient ID: Pamela [name], female   DOB: 04/15/1946, 71 y.o.   MRN: [id number]   Subjective: This patient presents today complaining of a painful callus on the plantar aspect left first MPJ request debridement of this painful lesion. Also, patient is complaining that she's having difficulty trimming her thickened toenails and requesting toenail debridement. Patient states that she's having difficulty has a nails are thickening and becoming more brittle.  Patient is diabetic and denies history of ulceration, claudication or amputation Patient denies smoking history  Objective: Orientated 3  Vascular: DP pulses are 2/4 bilaterally PT pulses 1/4 bilaterally Capillary reflex within normal limits bilaterally  Neurological: Sensation to 10 g monofilament wire intact 8/9 bilaterally Vibratory sensation reactive bilaterally Ankle reflexes reactive bilaterally  Dermatological: No open skin lesions bilaterally Nucleated plantar keratoses left hallux Bluish fluctuant lesion approximately 10 mm dorsal right first MPJ. Lesion is compressed and not palpable and reduces. Seems to be more consistent with adjacent pain in the area The toenails are elongated, brittle, discolored 6-10  Musculoskeletal: Atrophic fad pad MPJ and heel bilaterally There is no restriction ankle, subtalar, midtarsal joints bilaterally Manual motor testing dorsi flexion, plantar flexion, eversion, inversion 5/5 bilaterally  Assessment: Diabetic with decreased posterior tibial pulses bilaterally suggested of peripheral arterial disease No history of open wounds or claudication will maintain surveillance Probable varicosity dorsal right first MPJ and these no active treatment at this time Chronic plantar nucleated porokeratosis left hallux Mycotic toenails 6-10  Plan: Debrided toenails 6-10 mechanically electrical without any bleeding Debrided plantar callus left with slight bleeding treated with topical antibiotic  ointment and Band-Aid. Patient instructed removed Band-Aid 1-3 days and continue to apply topical antibiotic ointment and Band-Aid daily until a scab forms  Patient is requesting repetitive debridement of skin nails  Reappoint 3 months

## 2017-07-07 ENCOUNTER — Other Ambulatory Visit: Payer: Self-pay | Admitting: Obstetrics and Gynecology

## 2017-07-07 DIAGNOSIS — Z1231 Encounter for screening mammogram for malignant neoplasm of breast: Secondary | ICD-10-CM

## 2017-07-21 ENCOUNTER — Other Ambulatory Visit: Payer: Self-pay | Admitting: Gynecology

## 2017-07-21 DIAGNOSIS — R5381 Other malaise: Secondary | ICD-10-CM

## 2017-08-24 ENCOUNTER — Other Ambulatory Visit: Payer: Self-pay | Admitting: Gynecology

## 2017-08-24 DIAGNOSIS — E2839 Other primary ovarian failure: Secondary | ICD-10-CM

## 2017-08-26 ENCOUNTER — Ambulatory Visit
Admission: RE | Admit: 2017-08-26 | Discharge: 2017-08-26 | Disposition: A | Discharge status: Home / Self Care | Payer: Medicare Other | Source: Ambulatory Visit | Attending: Gynecology | Admitting: Gynecology

## 2017-08-26 ENCOUNTER — Ambulatory Visit
Admission: RE | Admit: 2017-08-26 | Discharge: 2017-08-26 | Disposition: A | Discharge status: Home / Self Care | Payer: Medicare Other | Source: Ambulatory Visit | Attending: Obstetrics and Gynecology | Admitting: Obstetrics and Gynecology

## 2017-08-26 DIAGNOSIS — Z780 Asymptomatic menopausal state: Secondary | ICD-10-CM | POA: Diagnosis not present

## 2017-08-26 DIAGNOSIS — Z1231 Encounter for screening mammogram for malignant neoplasm of breast: Secondary | ICD-10-CM | POA: Diagnosis not present

## 2017-08-26 DIAGNOSIS — E2839 Other primary ovarian failure: Secondary | ICD-10-CM

## 2017-08-26 DIAGNOSIS — Z13820 Encounter for screening for osteoporosis: Secondary | ICD-10-CM | POA: Diagnosis not present

## 2017-09-08 ENCOUNTER — Encounter: Payer: Self-pay | Admitting: *Deleted

## 2017-09-16 DIAGNOSIS — Z23 Encounter for immunization: Secondary | ICD-10-CM | POA: Diagnosis not present

## 2017-09-16 DIAGNOSIS — L304 Erythema intertrigo: Secondary | ICD-10-CM | POA: Diagnosis not present

## 2017-09-19 DIAGNOSIS — H903 Sensorineural hearing loss, bilateral: Secondary | ICD-10-CM | POA: Diagnosis not present

## 2017-09-20 DIAGNOSIS — M545 Low back pain: Secondary | ICD-10-CM | POA: Diagnosis not present

## 2017-09-20 DIAGNOSIS — Z1389 Encounter for screening for other disorder: Secondary | ICD-10-CM | POA: Diagnosis not present

## 2017-09-20 DIAGNOSIS — E1149 Type 2 diabetes mellitus with other diabetic neurological complication: Secondary | ICD-10-CM | POA: Diagnosis not present

## 2017-09-20 DIAGNOSIS — D126 Benign neoplasm of colon, unspecified: Secondary | ICD-10-CM | POA: Diagnosis not present

## 2017-09-20 DIAGNOSIS — I5189 Other ill-defined heart diseases: Secondary | ICD-10-CM | POA: Diagnosis not present

## 2017-09-20 DIAGNOSIS — E668 Other obesity: Secondary | ICD-10-CM | POA: Diagnosis not present

## 2017-09-20 DIAGNOSIS — E038 Other specified hypothyroidism: Secondary | ICD-10-CM | POA: Diagnosis not present

## 2017-09-20 DIAGNOSIS — E559 Vitamin D deficiency, unspecified: Secondary | ICD-10-CM | POA: Diagnosis not present

## 2017-09-20 DIAGNOSIS — Z6836 Body mass index (BMI) 36.0-36.9, adult: Secondary | ICD-10-CM | POA: Diagnosis not present

## 2017-09-20 DIAGNOSIS — E1142 Type 2 diabetes mellitus with diabetic polyneuropathy: Secondary | ICD-10-CM | POA: Diagnosis not present

## 2017-09-20 DIAGNOSIS — I10 Essential (primary) hypertension: Secondary | ICD-10-CM | POA: Diagnosis not present

## 2017-09-20 DIAGNOSIS — N08 Glomerular disorders in diseases classified elsewhere: Secondary | ICD-10-CM | POA: Diagnosis not present

## 2017-09-20 DIAGNOSIS — E7849 Other hyperlipidemia: Secondary | ICD-10-CM | POA: Diagnosis not present

## 2017-09-27 ENCOUNTER — Ambulatory Visit (INDEPENDENT_AMBULATORY_CARE_PROVIDER_SITE_OTHER): Payer: Medicare Other | Admitting: Cardiovascular Disease

## 2017-09-27 ENCOUNTER — Encounter: Payer: Self-pay | Admitting: Cardiovascular Disease

## 2017-09-27 ENCOUNTER — Encounter (INDEPENDENT_AMBULATORY_CARE_PROVIDER_SITE_OTHER): Payer: Self-pay

## 2017-09-27 VITALS — BP 142/70 | HR 66 | Ht 68.0 in | Wt 241.8 lb

## 2017-09-27 DIAGNOSIS — E668 Other obesity: Secondary | ICD-10-CM | POA: Diagnosis not present

## 2017-09-27 DIAGNOSIS — E785 Hyperlipidemia, unspecified: Secondary | ICD-10-CM | POA: Diagnosis not present

## 2017-09-27 DIAGNOSIS — E039 Hypothyroidism, unspecified: Secondary | ICD-10-CM | POA: Diagnosis not present

## 2017-09-27 DIAGNOSIS — I10 Essential (primary) hypertension: Secondary | ICD-10-CM

## 2017-09-27 DIAGNOSIS — E119 Type 2 diabetes mellitus without complications: Secondary | ICD-10-CM

## 2017-09-27 MED ORDER — EZETIMIBE 10 MG PO TABS: 10.0000 mg | ORAL_TABLET | Freq: Every day | ORAL | 0 refills | Status: DC

## 2017-09-27 MED ORDER — CARVEDILOL 6.25 MG PO TABS: ORAL_TABLET | ORAL | 3 refills | Status: DC

## 2017-09-27 MED ORDER — EZETIMIBE 10 MG PO TABS: 10.0000 mg | ORAL_TABLET | Freq: Every day | ORAL | 3 refills | Status: DC

## 2017-09-27 NOTE — Patient Instructions (Signed)
Medication Instructions:  Your physician has recommended you make the following change in your medication:  1) START Zetia 10mg  tablet by mouth ONCE daily 2) DECREASE Coreg to 9.375mg  (1.5 tablets) by mouth in the MORNING and 6.25mg  (1 tablet) by mouth in the EVENING   Labwork: NONE  Testing/Procedures: NONE  Follow-Up: Your physician wants you to follow-up in: 1 year with Dr. Claiborne Billings. You will receive a reminder letter in the mail two months in advance. If you don't receive a letter, please call our office to schedule the follow-up appointment.   Any Other Special Instructions Will Be Listed Below (If Applicable).     If you need a refill on your cardiac medications before your next appointment, please call your pharmacy.

## 2017-09-27 NOTE — Progress Notes (Signed)
Patient ID: Pamela [name], female   DOB: Jun 08, 1946, 72 y.o.   MRN: [id number]    HPI: Pamela [name] is a 72 y.o. female who presents 7 month followup cardiology evaluation.  Pamela [name] has a long-standing history of obesity, hypertension, type 2 diabetes mellitus, and hyperlipidemia. A Lexiscan Myoview study in March 2014  done for evaluation of chest pain revealed normal perfusion without scar or ischemia. Post-rest ejection fraction was 74%.  She has a history of obesity and when I had seen her in 2013 she had lost approximaltey 20 pounds. She has a history of hyperlipidemia and had been started on lipid-lowering therapy when her NMR lipoprotein LDL particle number was 1834 and LDL of 110, small LDL particles of 1083 and triglycerides of 176. Initially she was taking Crestor 10 mg daily but developed myalgias and reduced the dose to every other day.  She is no longer taking statin therapy.  She is followed by Dr. Carrolyn Meiers.   In July 2017, she underwent a five-year follow-up echo Doppler study which showed an EF of 60-65%.  There was mild LVH and grade 1 diastolic dysfunction.  She had mildly thickened mitral valve leaflets with trace to mild MR.  Since I last saw her, denies any episodes of chest pain.  She denies significant shortness of breath.  She cannot tolerate statins admits to having myalgias with all statins.  She is on levothyroxine for hypothyroidism, carvedilol 6.25 mg twice a day in addition to lisinopril 40 mg daily and HCTZ 25 mg daily for hypertension.  When I saw her last year.  I had recommended titration of carvedilol to 9.375 mg twice a day, but she felt fatigued and reverted to her prior dose.  Dr. Forde Dandy had laboratory.  Recently on 09/21/2007.  Her total cholesterol was 209, triglycerides 150, HDL 63, LDL 116.  She continues to be on JentoDueto XR and glimepiride for her diabetes mellitus.  She presents for evaluation.  Past Medical History:  Diagnosis Date  . Diabetes  mellitus ([place])   . GERD (gastroesophageal reflux disease)   . Heart murmur   . Hyperlipidemia   . Hypertension   . Thyroid disease    hypo    Past Surgical History:  Procedure Laterality Date  . ABDOMINAL HYSTERECTOMY    . CHOLECYSTECTOMY    . COLONOSCOPY    . TONSILLECTOMY      Allergies  Allergen Reactions  . Demerol [Meperidine] Other (See Comments)    Unspecified   . Macrobid [Nitrofurantoin Macrocrystal] Other (See Comments)    Unspecified   . Meclizine Other (See Comments)    Unspecified   . Sulfa Antibiotics Other (See Comments)    Unspecified     Current Outpatient Medications  Medication Sig Dispense Refill  . aspirin 81 MG tablet Take 325 mg by mouth daily.     . carvedilol (COREG) 6.25 MG tablet Take 1.5 tablets (9.'375mg'$ ) in the MORNING; take 1 tablet (6.'25mg'$ ) in the EVENING 270 tablet 3  . Coenzyme Q10 (CO Q 10) 100 MG CAPS Take 200 mg by mouth daily.    Marland Kitchen glimepiride (AMARYL) 2 MG tablet Take 2 mg by mouth daily before breakfast.    . hydrochlorothiazide (HYDRODIURIL) 25 MG tablet Take 1 tablet (25 mg total) by mouth daily. 90 tablet 3  . levothyroxine (SYNTHROID, LEVOTHROID) 137 MCG tablet Take 137 mcg by mouth daily before breakfast. One daily, 1 and 1/2 on Sunday    . Linagliptin-Metformin HCl ER (  JENTADUETO XR) 2.01-999 MG TB24 Take 1 tablet by mouth every morning.    Marland Kitchen lisinopril (PRINIVIL,ZESTRIL) 40 MG tablet Take 40 mg by mouth daily.    . Potassium Gluconate 595 MG CAPS Take 1 capsule by mouth daily.    Marland Kitchen ezetimibe (ZETIA) 10 MG tablet Take 1 tablet (10 mg total) by mouth daily. 90 tablet 0  . ezetimibe (ZETIA) 10 MG tablet Take 1 tablet (10 mg total) by mouth daily. 90 tablet 3   No current facility-administered medications for this visit.     Socially she is married to she has one child. There is no tobacco or alcohol use.  ROS General: Negative; No fevers, chills, or night sweats;  HEENT: Negative; No changes in vision or hearing, sinus  congestion, difficulty swallowing Pulmonary: Negative; No cough, wheezing, shortness of breath, hemoptysis Cardiovascular: Negative; No chest pain, presyncope, syncope, palpitations Occasional leg swelling GI: Negative; No nausea, vomiting, diarrhea, or abdominal pain GU: Negative; No dysuria, hematuria, or difficulty voiding Musculoskeletal: Negative; no myalgias, joint pain, or weakness Hematologic/Oncology: Negative; no easy bruising, bleeding Endocrine: Negative; no heat/cold intolerance; no diabetes Neuro: Negative; no changes in balance, headaches Skin: Negative; No rashes or skin lesions Psychiatric: Negative; No behavioral problems, depression Sleep: Negative; No snoring, daytime sleepiness, hypersomnolence, bruxism, restless legs, hypnogognic hallucinations, no cataplexy Other comprehensive 14 point system review is negative.   PE BP (!) 142/70   Pulse 66   Ht '5\' 8"'$  (1.727 m)   Wt 241 lb 12.8 oz (109.7 kg)   BMI 36.77 kg/m     Repeat blood pressure by me was 145/72.  Wt Readings from Last 3 Encounters:  09/27/17 241 lb 12.8 oz (109.7 kg)  02/28/17 240 lb (108.9 kg)  03/12/16 229 lb 12.8 oz (104.2 kg)   General: Alert, oriented, no distress.  Skin: normal turgor, no rashes, warm and dry HEENT: Normocephalic, atraumatic. Pupils equal round and reactive to light; sclera anicteric; extraocular muscles intact;  Nose without nasal septal hypertrophy Mouth/Parynx benign; Mallinpatti scale 3 Neck: No JVD, no carotid bruits; normal carotid upstroke Lungs: clear to ausculatation and percussion; no wheezing or rales Chest wall: without tenderness to palpitation Heart: PMI not displaced, RRR, s1 s2 normal, 1/6 systolic murmur, no diastolic murmur, no rubs, gallops, thrills, or heaves Abdomen: soft, nontender; no hepatosplenomehaly, BS+; abdominal aorta nontender and not dilated by palpation. Back: no CVA tenderness Pulses 2+ Musculoskeletal: full range of motion, normal  strength, no joint deformities Extremities: no clubbing cyanosis or edema, Homan's sign negative  Neurologic: grossly nonfocal; Cranial nerves grossly wnl Psychologic: Normal mood and affect   ECG (independently read by me): Sinus rhythm at 66 with PAC.  ECG (independently read by me): Sinus rhythm with sinus arrhythmia and quadrigeminal PVCs.  QTc interval 410 ms.  No significant ST segment change.  June 2016 ECG (independently read by me): Normal sinus rhythm at 73 bpm.  No ectopy.  Normal intervals.  December 2015 ECG (independently read by me): Normal sinus rhythm at 65 bpm.  No ectopy.  Normal intervals.  No ST segment changes.    July 2014 ECG: Normal sinus rhythm at 63 beats per minute. Normal intervals.  LABS:  BMP Latest Ref Rng & Units 03/01/2017 09/26/2014 04/17/2013  Glucose 65 - 99 mg/dL 181(H) 239(H) 172(H)  BUN 8 - 27 mg/dL '17 19 11  '$ Creatinine 0.57 - 1.00 mg/dL 0.78 0.69 0.64  BUN/Creat Ratio 12 - 28 22 - -  Sodium 134 - 144 mmol/L 137  137 139  Potassium 3.5 - 5.2 mmol/L 4.5 4.2 4.4  Chloride 96 - 106 mmol/L 100 102 105  CO2 20 - 29 mmol/L '20 27 26  '$ Calcium 8.7 - 10.3 mg/dL 9.4 9.0 10.1   Hepatic Function Latest Ref Rng & Units 03/01/2017 04/17/2013 05/31/2009  Total Protein 6.0 - 8.5 g/dL 6.5 6.8 6.5  Albumin 3.5 - 4.8 g/dL 3.9 3.9 3.4(L)  AST 0 - 40 IU/L '17 25 26  '$ ALT 0 - 32 IU/L '15 22 24  '$ Alk Phosphatase 39 - 117 IU/L 75 67 76  Total Bilirubin 0.0 - 1.2 mg/dL 0.2 0.4 0.6   CBC Latest Ref Rng & Units 03/01/2017 06/02/2009 06/01/2009  WBC 3.4 - 10.8 x10E3/uL 10.3 11.0(H) 9.6  Hemoglobin 11.1 - 15.9 g/dL 10.3(L) 12.0 12.8  Hematocrit 34.0 - 46.6 % 33.1(L) 35.7(L) 38.4  Platelets 150 - 379 x10E3/uL 374 285 310   Lab Results  Component Value Date   MCV 82 03/01/2017   MCV 86.4 06/02/2009   MCV 86.1 06/01/2009    Lab done on 11/14/2014 at [facility] by Dr. Reynold Bowen.  Glucose was increased at 258.  Total cholesterol 213, triglycerides 207, HDL  59, LDL 113.  LFTs normal.  BUN 19, creatinine 0.8.  Laboratory from 09/23/2017 by Dr. Forde Dandy was reviewed.  He 116, current 0.9.  Glucose 230. Lipid studies as above in history of present illness. TSH 1.54.  Free T4 1 0.6.  Vitamin D 40.9.    RADIOLOGY: No results found.  IMPRESSION:  1. Essential hypertension   2. Hyperlipidemia with target LDL less than 70   3. Moderate obesity   4. Hypothyroidism, unspecified type   5. Type 2 diabetes mellitus without complication, without long-term current use of insulin ([place])     ASSESSMENT AND PLAN:  Pamela [name] is a 72 year old female who has a history of moderate obesity, hypertension, type 2 diabetes mellitus, and hypothyroidism.  I saw her last year.  I had suggested slight increase in carvedilol dosing.  She has continued to be on lisinopril 40 mg, HCTZ 25 mg and continues to be on her prior dose of carvedilol.  As a compromise.  I've suggested she try taking carvedilol 9.375 mg in the morning but continue taking the 6.25 mg in the evening.  Hopefully this will improve her blood pressure which is elevated today.  Based on recent hypertensive guidelines.  When I last saw her last year she was also having a quadrigeminal rhythm with PVCs.  Her ECG today is stable and demonstrates sinus rhythm although PAC is present.  I reviewed the recent blood work done by Dr. Carrolyn Meiers at [facility]  Lipid studies are elevated.  She has had issues with myalgias with all statins.  I have suggested the addition of Zetia 10 mg per medical regimen.  She will be seeing Dr. Forde Dandy in follow-up and repeat laboratory Be performed.  Her BMI is elevated at 36.77.  Weight loss and increased exercise was strongly recommended.  She continues to be on her medications for diabetes mellitus and thyroid disease.  Recent TSH is normal.  I will see her in one year for reevaluation.  Time spent: 25 minutes  Troy Sine, MD, [facility]  09/29/2017 9:07 PM

## 2017-09-29 ENCOUNTER — Encounter: Payer: Self-pay | Admitting: Cardiovascular Disease

## 2017-10-10 ENCOUNTER — Ambulatory Visit (INDEPENDENT_AMBULATORY_CARE_PROVIDER_SITE_OTHER): Payer: Medicare Other | Admitting: Podiatry

## 2017-10-10 ENCOUNTER — Encounter: Payer: Self-pay | Admitting: Podiatry

## 2017-10-10 DIAGNOSIS — B351 Tinea unguium: Secondary | ICD-10-CM

## 2017-10-10 DIAGNOSIS — M79675 Pain in left toe(s): Secondary | ICD-10-CM

## 2017-10-10 DIAGNOSIS — M79674 Pain in right toe(s): Secondary | ICD-10-CM

## 2017-10-12 NOTE — Progress Notes (Signed)
Subjective:   Patient ID: Pamela [name], female   DOB: 72 y.o.   MRN: [id number]   HPI Patient presents with elongated nailbeds 1-5 both feet that are thick yellow brittle and they are hard for her to cut and painful   ROS      Objective:  Physical Exam  Neurovascular status is intact with thick yellow brittle nailbeds 1-5 both feet that are painful when palpated      Assessment:  Mycotic nail infection with pain 1-5 both feet     Plan:  Debride painful nailbeds 1-5 both feet with no iatrogenic bleeding noted

## 2018-01-11 ENCOUNTER — Encounter: Payer: Self-pay | Admitting: Podiatry

## 2018-01-11 ENCOUNTER — Ambulatory Visit (INDEPENDENT_AMBULATORY_CARE_PROVIDER_SITE_OTHER): Payer: Medicare Other | Admitting: Podiatry

## 2018-01-11 DIAGNOSIS — L84 Corns and callosities: Secondary | ICD-10-CM | POA: Diagnosis not present

## 2018-01-11 DIAGNOSIS — E119 Type 2 diabetes mellitus without complications: Secondary | ICD-10-CM | POA: Diagnosis not present

## 2018-01-11 DIAGNOSIS — M79674 Pain in right toe(s): Secondary | ICD-10-CM

## 2018-01-11 DIAGNOSIS — M79675 Pain in left toe(s): Secondary | ICD-10-CM

## 2018-01-11 DIAGNOSIS — B351 Tinea unguium: Secondary | ICD-10-CM

## 2018-01-12 ENCOUNTER — Encounter: Payer: Self-pay | Admitting: Podiatry

## 2018-01-12 NOTE — Progress Notes (Addendum)
Complaint:  Visit Type: Patient returns to my office for continued preventative foot care services. Complaint: Patient states" my nails have grown long and thick and become painful to walk and wear shoes" Patient has been diagnosed with DM with no foot complications. This patient also states she has a painful callus under the ball of her left foot The patient presents for preventative foot care services. No changes to ROS  Podiatric Exam: Vascular: dorsalis pedis and posterior tibial pulses are palpable bilateral. Capillary return is immediate. Temperature gradient is WNL. Skin turgor WNL  Sensorium: Normal Semmes Weinstein monofilament test. Normal tactile sensation bilaterally. Nail Exam: Pt has thick disfigured discolored nails with subungual debris noted bilateral entire nail hallux through fifth toenails Ulcer Exam: There is no evidence of ulcer or pre-ulcerative changes or infection. Orthopedic Exam: Muscle tone and strength are WNL. No limitations in general ROM. No crepitus or effusions noted. Foot type and digits show no abnormalities. HAV  B/L. Skin:  Porokeratosis sub tibial sesamoid left foot. . No infection or ulcers  Diagnosis:  Onychomycosis, , Pain in right toe, pain in left toes  Porokeratosis left foot.  Treatment & Plan Procedures and Treatment: Consent by patient was obtained for treatment procedures.   Debridement of mycotic and hypertrophic toenails, 1 through 5 bilateral and clearing of subungual debris. No ulceration, no infection noted. Debride porokeratosis left foot. Return Visit-Office Procedure: Patient instructed to return to the office for a follow up visit 3 months for continued evaluation and treatment.    Gardiner Barefoot DPM

## 2018-01-24 DIAGNOSIS — E559 Vitamin D deficiency, unspecified: Secondary | ICD-10-CM | POA: Diagnosis not present

## 2018-01-24 DIAGNOSIS — D126 Benign neoplasm of colon, unspecified: Secondary | ICD-10-CM | POA: Insufficient documentation

## 2018-01-24 DIAGNOSIS — I10 Essential (primary) hypertension: Secondary | ICD-10-CM | POA: Diagnosis not present

## 2018-01-24 DIAGNOSIS — E1149 Type 2 diabetes mellitus with other diabetic neurological complication: Secondary | ICD-10-CM | POA: Diagnosis not present

## 2018-01-24 DIAGNOSIS — E1142 Type 2 diabetes mellitus with diabetic polyneuropathy: Secondary | ICD-10-CM | POA: Diagnosis not present

## 2018-01-24 DIAGNOSIS — E7849 Other hyperlipidemia: Secondary | ICD-10-CM | POA: Diagnosis not present

## 2018-01-24 DIAGNOSIS — I5189 Other ill-defined heart diseases: Secondary | ICD-10-CM | POA: Diagnosis not present

## 2018-01-24 DIAGNOSIS — E038 Other specified hypothyroidism: Secondary | ICD-10-CM | POA: Diagnosis not present

## 2018-01-24 DIAGNOSIS — N08 Glomerular disorders in diseases classified elsewhere: Secondary | ICD-10-CM | POA: Diagnosis not present

## 2018-01-24 DIAGNOSIS — Z6836 Body mass index (BMI) 36.0-36.9, adult: Secondary | ICD-10-CM | POA: Diagnosis not present

## 2018-01-24 DIAGNOSIS — Z1389 Encounter for screening for other disorder: Secondary | ICD-10-CM | POA: Diagnosis not present

## 2018-02-17 ENCOUNTER — Other Ambulatory Visit: Payer: Self-pay | Admitting: Cardiovascular Disease

## 2018-02-17 MED ORDER — HYDROCHLOROTHIAZIDE 25 MG PO TABS: 25.0000 mg | ORAL_TABLET | Freq: Every day | ORAL | 3 refills | Status: DC

## 2018-02-17 NOTE — Telephone Encounter (Signed)
Pt calling requesting a refill on Hydrochlorothiazide 25 mg tablet, sent to [facility]. Please address

## 2018-03-28 DIAGNOSIS — H2512 Age-related nuclear cataract, left eye: Secondary | ICD-10-CM | POA: Diagnosis not present

## 2018-03-28 DIAGNOSIS — H2511 Age-related nuclear cataract, right eye: Secondary | ICD-10-CM | POA: Diagnosis not present

## 2018-03-28 DIAGNOSIS — E119 Type 2 diabetes mellitus without complications: Secondary | ICD-10-CM | POA: Diagnosis not present

## 2018-03-28 DIAGNOSIS — H43822 Vitreomacular adhesion, left eye: Secondary | ICD-10-CM | POA: Diagnosis not present

## 2018-03-28 DIAGNOSIS — H43821 Vitreomacular adhesion, right eye: Secondary | ICD-10-CM | POA: Diagnosis not present

## 2018-03-31 ENCOUNTER — Encounter: Payer: Self-pay | Admitting: Podiatry

## 2018-03-31 ENCOUNTER — Ambulatory Visit (INDEPENDENT_AMBULATORY_CARE_PROVIDER_SITE_OTHER): Payer: Medicare Other | Admitting: Podiatry

## 2018-03-31 DIAGNOSIS — L03031 Cellulitis of right toe: Secondary | ICD-10-CM | POA: Diagnosis not present

## 2018-03-31 DIAGNOSIS — L03032 Cellulitis of left toe: Secondary | ICD-10-CM | POA: Diagnosis not present

## 2018-03-31 NOTE — Patient Instructions (Signed)

## 2018-04-02 NOTE — Progress Notes (Signed)
Subjective:  Patient ID: Pamela [name], female    DOB: March 29, 1946,  MRN: [id number]  Chief Complaint  Patient presents with  . Toe Pain    Hallux left (medial border), Hallux right (lateral) - red, swollen x 1-2 weeks, has nail trim appt scheduled with Prudence Davidson in 2 weeks,    72 y.o. female presents with and ingrown toenail to the both great toes.  Past Medical History:  Diagnosis Date  . Diabetes mellitus ([place])   . GERD (gastroesophageal reflux disease)   . Heart murmur   . Hyperlipidemia   . Hypertension   . Thyroid disease    hypo   Past Surgical History:  Procedure Laterality Date  . ABDOMINAL HYSTERECTOMY    . CHOLECYSTECTOMY    . COLONOSCOPY    . TONSILLECTOMY      Current Outpatient Medications:  .  aspirin 81 MG tablet, Take 325 mg by mouth daily. , Disp: , Rfl:  .  carvedilol (COREG) 6.25 MG tablet, Take 1.5 tablets (9.375mg ) in the MORNING; take 1 tablet (6.25mg ) in the EVENING, Disp: 270 tablet, Rfl: 3 .  Coenzyme Q10 (CO Q 10) 100 MG CAPS, Take 200 mg by mouth daily., Disp: , Rfl:  .  ezetimibe (ZETIA) 10 MG tablet, Take 1 tablet (10 mg total) by mouth daily., Disp: 90 tablet, Rfl: 0 .  ezetimibe (ZETIA) 10 MG tablet, Take 1 tablet (10 mg total) by mouth daily., Disp: 90 tablet, Rfl: 3 .  glimepiride (AMARYL) 2 MG tablet, Take 2 mg by mouth daily before breakfast., Disp: , Rfl:  .  hydrochlorothiazide (HYDRODIURIL) 25 MG tablet, Take 1 tablet (25 mg total) by mouth daily., Disp: 90 tablet, Rfl: 3 .  levothyroxine (SYNTHROID, LEVOTHROID) 137 MCG tablet, Take 137 mcg by mouth daily before breakfast. One daily, 1 and 1/2 on Sunday, Disp: , Rfl:  .  Linagliptin-Metformin HCl ER (JENTADUETO XR) 2.01-999 MG TB24, Take 1 tablet by mouth every morning., Disp: , Rfl:  .  lisinopril (PRINIVIL,ZESTRIL) 40 MG tablet, Take 40 mg by mouth daily., Disp: , Rfl:  .  methocarbamol (ROBAXIN) 500 MG tablet, , Disp: , Rfl: 1 .  Potassium Gluconate 595 MG CAPS, Take 1 capsule by mouth  daily., Disp: , Rfl:   Allergies  Allergen Reactions  . Demerol [Meperidine] Other (See Comments)    Unspecified   . Macrobid [Nitrofurantoin Macrocrystal] Other (See Comments)    Unspecified   . Meclizine Other (See Comments)    Unspecified   . Sulfa Antibiotics Other (See Comments)    Unspecified    Review of Systems Objective:  There were no vitals filed for this visit. General AA&O x3. Normal mood and affect.  Vascular Dorsalis pedis and posterior tibial pulses  present 2+ bilaterally  Capillary refill normal to all digits. Pedal hair growth normal.  Neurologic Epicritic sensation grossly present.  Dermatologic No open lesions. Interspaces clear of maceration. Nails well groomed and normal in appearance. Painful ingrowing nail at Left hallux medial border right hallux lateral border  Orthopedic: MMT 5/5 in dorsiflexion, plantarflexion, inversion, and eversion. Normal joint ROM without pain or crepitus. Pain to palpation about the ingrown nail.   Assessment & Plan:  Patient was evaluated and treated and all questions answered.  Ingrown Nail, bilaterally -Patient elects to proceed with ingrown toenail removal today.  Discussed the patient's risk of delayed healing due to diabetes.  Due to diabetes will avoid using tourniquet and will avoid phenol. -Ingrown nail excised. See procedure note.  -  Educated on post-procedure care including soaking. Written instructions provided. -Patient to follow up in 2 weeks for nail check.  Procedure: Excision of Ingrown Toenail Location: Left hallux medial border right hallux lateral border Anesthesia: Lidocaine 1% plain; 1.5 mL and Marcaine 0.5% plain; 1.5 mL, digital block. Skin Prep: Betadine. Dressing: Silvadene; telfa; dry, sterile, compression dressing. Technique: Both toes were prepped with Betadine.  No tourniquet was used due to diabetes and concern for PAD.  The ingrown aspect of the nail was gently freed and split with a nail  splitter.  The nails were avulsed with a hemostat.  Both areas were cleansed.  Antibiotic ointment and sterile dressing applied.. Disposition: Patient tolerated procedure well. Patient to return in 2 weeks for follow-up.   Return in about 2 weeks (around 04/14/2018) for nurse sched-, Nail Check, both feet.

## 2018-04-12 ENCOUNTER — Ambulatory Visit: Payer: Medicare Other | Admitting: Podiatry

## 2018-04-14 ENCOUNTER — Ambulatory Visit (INDEPENDENT_AMBULATORY_CARE_PROVIDER_SITE_OTHER): Payer: Self-pay

## 2018-04-14 DIAGNOSIS — L03031 Cellulitis of right toe: Secondary | ICD-10-CM

## 2018-04-14 NOTE — Patient Instructions (Signed)

## 2018-04-21 NOTE — Progress Notes (Signed)
Patient presents for follow-up.  Procedure performed: Excision of ingrown toenail, left hallux medial border, right hallux lateral border.  Procedure was performed on 03/31/2018.  She states that she is having no pain to the area and has been soaking and following post op instructions.  Noted well-healing surgical sites.  No redness, no erythema, no drainage, no other signs of infection.  Area has scabbed over and there is no pain or tenderness on palpation.  Discussed signs and symptoms of infection, patient is to follow-up with any acute symptom changes or with questions or concerns.

## 2018-05-24 DIAGNOSIS — E1142 Type 2 diabetes mellitus with diabetic polyneuropathy: Secondary | ICD-10-CM | POA: Diagnosis not present

## 2018-05-24 DIAGNOSIS — D126 Benign neoplasm of colon, unspecified: Secondary | ICD-10-CM | POA: Diagnosis not present

## 2018-05-24 DIAGNOSIS — E559 Vitamin D deficiency, unspecified: Secondary | ICD-10-CM | POA: Diagnosis not present

## 2018-05-24 DIAGNOSIS — I5189 Other ill-defined heart diseases: Secondary | ICD-10-CM | POA: Diagnosis not present

## 2018-05-24 DIAGNOSIS — Z6834 Body mass index (BMI) 34.0-34.9, adult: Secondary | ICD-10-CM | POA: Diagnosis not present

## 2018-05-24 DIAGNOSIS — E038 Other specified hypothyroidism: Secondary | ICD-10-CM | POA: Diagnosis not present

## 2018-05-24 DIAGNOSIS — I10 Essential (primary) hypertension: Secondary | ICD-10-CM | POA: Diagnosis not present

## 2018-05-24 DIAGNOSIS — E7849 Other hyperlipidemia: Secondary | ICD-10-CM | POA: Diagnosis not present

## 2018-05-24 DIAGNOSIS — R269 Unspecified abnormalities of gait and mobility: Secondary | ICD-10-CM | POA: Insufficient documentation

## 2018-05-24 DIAGNOSIS — N08 Glomerular disorders in diseases classified elsewhere: Secondary | ICD-10-CM | POA: Diagnosis not present

## 2018-05-24 DIAGNOSIS — E1140 Type 2 diabetes mellitus with diabetic neuropathy, unspecified: Secondary | ICD-10-CM | POA: Diagnosis not present

## 2018-05-25 DIAGNOSIS — M1711 Unilateral primary osteoarthritis, right knee: Secondary | ICD-10-CM | POA: Diagnosis not present

## 2018-05-25 DIAGNOSIS — M25561 Pain in right knee: Secondary | ICD-10-CM | POA: Diagnosis not present

## 2018-06-02 DIAGNOSIS — M1711 Unilateral primary osteoarthritis, right knee: Secondary | ICD-10-CM | POA: Diagnosis not present

## 2018-06-09 DIAGNOSIS — M1712 Unilateral primary osteoarthritis, left knee: Secondary | ICD-10-CM | POA: Diagnosis not present

## 2018-06-22 DIAGNOSIS — M1711 Unilateral primary osteoarthritis, right knee: Secondary | ICD-10-CM | POA: Diagnosis not present

## 2018-06-26 DIAGNOSIS — E113293 Type 2 diabetes mellitus with mild nonproliferative diabetic retinopathy without macular edema, bilateral: Secondary | ICD-10-CM | POA: Diagnosis not present

## 2018-06-26 DIAGNOSIS — H25013 Cortical age-related cataract, bilateral: Secondary | ICD-10-CM | POA: Diagnosis not present

## 2018-06-26 DIAGNOSIS — H2513 Age-related nuclear cataract, bilateral: Secondary | ICD-10-CM | POA: Diagnosis not present

## 2018-06-26 DIAGNOSIS — E119 Type 2 diabetes mellitus without complications: Secondary | ICD-10-CM | POA: Diagnosis not present

## 2018-07-05 ENCOUNTER — Other Ambulatory Visit: Payer: Self-pay | Admitting: Cardiovascular Disease

## 2018-07-07 ENCOUNTER — Ambulatory Visit (INDEPENDENT_AMBULATORY_CARE_PROVIDER_SITE_OTHER): Payer: Medicare Other | Admitting: Podiatry

## 2018-07-07 ENCOUNTER — Encounter: Payer: Self-pay | Admitting: Podiatry

## 2018-07-07 DIAGNOSIS — B351 Tinea unguium: Secondary | ICD-10-CM

## 2018-07-07 DIAGNOSIS — M79674 Pain in right toe(s): Secondary | ICD-10-CM | POA: Diagnosis not present

## 2018-07-07 DIAGNOSIS — M79675 Pain in left toe(s): Secondary | ICD-10-CM | POA: Diagnosis not present

## 2018-07-20 ENCOUNTER — Other Ambulatory Visit: Payer: Self-pay | Admitting: Obstetrics and Gynecology

## 2018-07-20 DIAGNOSIS — Z1231 Encounter for screening mammogram for malignant neoplasm of breast: Secondary | ICD-10-CM

## 2018-07-28 DIAGNOSIS — M1711 Unilateral primary osteoarthritis, right knee: Secondary | ICD-10-CM | POA: Diagnosis not present

## 2018-07-29 NOTE — Progress Notes (Signed)
Subjective: Pamela [name] is seen in clinic today for diabetic foot care follow-up.  She relates elongated, painful, discolored, thick toenails 1 through 5 of both feet.  She relates pain when wearing enclosed shoe gear.  Pain is relieved with periodic professional debridement.    She relates she had ingrown toenails removed in July and they are doing well.  Pamela [name] voices no new problems on today's visit.  Objective: Vascular examination: Dorsalis pedis and posterior tibial pulses are 2/4 bilaterally Capillary refill time immediate to all 10 digits Pedal hair growth is present bilaterally.  Dermatological Examination: Skin with normal turgor texture and tone bilaterally No open wounds No interdigital macerations toenails 1-5 b/l discolored, thick, dystrophic with subungual debris and pain with palpation to nailbeds due to thickness of nails.  Musculoskeletal: Muscle strength 5/5 to all LE muscle groups  Neurological: Sensation intact with 10 gram monofilament. Vibratory sensation intact.  Assessment: 1. Painful onychomycosis toenails 1-5 b/l   Plan: 1. Toenails 1-5 b/l were debrided in length and girth without iatrogenic bleeding. 2. Patient to continue soft, supportive shoe gear 3. Patient to report any pedal injuries to medical professional immediately. 4. Follow up 3 months. Patient/POA to call should there be a concern in the interim.

## 2018-09-08 ENCOUNTER — Ambulatory Visit: Payer: TRICARE For Life (TFL) | Admitting: Podiatry

## 2018-09-11 ENCOUNTER — Ambulatory Visit (INDEPENDENT_AMBULATORY_CARE_PROVIDER_SITE_OTHER): Payer: Medicare Other | Admitting: Podiatry

## 2018-09-11 ENCOUNTER — Encounter: Payer: Self-pay | Admitting: Podiatry

## 2018-09-11 DIAGNOSIS — B351 Tinea unguium: Secondary | ICD-10-CM | POA: Diagnosis not present

## 2018-09-11 DIAGNOSIS — M79674 Pain in right toe(s): Secondary | ICD-10-CM | POA: Diagnosis not present

## 2018-09-11 DIAGNOSIS — M79675 Pain in left toe(s): Secondary | ICD-10-CM | POA: Diagnosis not present

## 2018-09-19 ENCOUNTER — Ambulatory Visit
Admission: RE | Admit: 2018-09-19 | Discharge: 2018-09-19 | Disposition: A | Discharge status: Home / Self Care | Payer: Medicare Other | Source: Ambulatory Visit | Attending: Obstetrics and Gynecology | Admitting: Obstetrics and Gynecology

## 2018-09-19 DIAGNOSIS — Z1231 Encounter for screening mammogram for malignant neoplasm of breast: Secondary | ICD-10-CM | POA: Diagnosis not present

## 2018-09-21 DIAGNOSIS — L821 Other seborrheic keratosis: Secondary | ICD-10-CM | POA: Diagnosis not present

## 2018-09-21 DIAGNOSIS — D1730 Benign lipomatous neoplasm of skin and subcutaneous tissue of unspecified sites: Secondary | ICD-10-CM | POA: Diagnosis not present

## 2018-09-21 DIAGNOSIS — Z23 Encounter for immunization: Secondary | ICD-10-CM | POA: Diagnosis not present

## 2018-09-21 DIAGNOSIS — L719 Rosacea, unspecified: Secondary | ICD-10-CM | POA: Diagnosis not present

## 2018-10-07 ENCOUNTER — Encounter: Payer: Self-pay | Admitting: Podiatry

## 2018-10-07 NOTE — Progress Notes (Signed)
Subjective:  Patient presents to clinic with cc of  painful, thick, discolored, elongated toenails 1-5 b/l that become tender and cannot cut because of thickness.  Patient relates no new problems on today's visit  Last hemoglobin A1c 7.7.  Reynold Bowen, MD is her PCP.   Current Outpatient Medications:  .  aspirin 81 MG tablet, Take 325 mg by mouth daily. , Disp: , Rfl:  .  carvedilol (COREG) 6.25 MG tablet, Take 1.5 tablets (9.375mg ) in the MORNING; take 1 tablet (6.25mg ) in the EVENING, Disp: 270 tablet, Rfl: 3 .  carvedilol (COREG) 6.25 MG tablet, TAKE ONE AND ONE-HALF TABLETS TWICE A DAY WITH MEALS, Disp: 270 tablet, Rfl: 1 .  Coenzyme Q10 (CO Q 10) 100 MG CAPS, Take 200 mg by mouth daily., Disp: , Rfl:  .  ezetimibe (ZETIA) 10 MG tablet, Take 1 tablet (10 mg total) by mouth daily., Disp: 90 tablet, Rfl: 0 .  ezetimibe (ZETIA) 10 MG tablet, Take 1 tablet (10 mg total) by mouth daily., Disp: 90 tablet, Rfl: 3 .  glimepiride (AMARYL) 2 MG tablet, Take 2 mg by mouth daily before breakfast., Disp: , Rfl:  .  hydrochlorothiazide (HYDRODIURIL) 25 MG tablet, Take 1 tablet (25 mg total) by mouth daily., Disp: 90 tablet, Rfl: 3 .  levothyroxine (SYNTHROID, LEVOTHROID) 137 MCG tablet, Take 137 mcg by mouth daily before breakfast. One daily, 1 and 1/2 on Sunday, Disp: , Rfl:  .  Linagliptin-Metformin HCl ER (JENTADUETO XR) 2.01-999 MG TB24, Take 1 tablet by mouth every morning., Disp: , Rfl:  .  lisinopril (PRINIVIL,ZESTRIL) 40 MG tablet, Take 40 mg by mouth daily., Disp: , Rfl:  .  methocarbamol (ROBAXIN) 500 MG tablet, , Disp: , Rfl: 1 .  Potassium Gluconate 595 MG CAPS, Take 1 capsule by mouth daily., Disp: , Rfl:    Allergies  Allergen Reactions  . Demerol [Meperidine] Other (See Comments)    Unspecified   . Macrobid [Nitrofurantoin Macrocrystal] Other (See Comments)    Unspecified   . Meclizine Other (See Comments)    Unspecified   . Sulfa Antibiotics Other (See Comments)   Unspecified      Objective:  Physical Examination: Neurovascular status intact as evidenced with dorsalis pedis and posterior tibial pulses 2/4 bilaterally.  Capillary refill time immediate immediate to all 10 digits.  Pedal hair growth is present bilaterally.  Sensation intact with 10 g monofilament.  She is noted to have thick, discolored brittle toenails 1-5 b/l.  Muscle strength 5 out of 5 to all lower extremity muscle groups bilaterally  Assessment: Mycotic nail infection with pain 1-5 b/l  Plan:  Debride painful toenails 1-5 b/l with no iatrogenic bleeding. Continue soft supportive shoe gear daily. Report any pedal injuries to medical professional immediately. Follow-up 3 months. Patient to call should they have any questions or concerns in the interim.

## 2018-10-11 DIAGNOSIS — I5189 Other ill-defined heart diseases: Secondary | ICD-10-CM | POA: Diagnosis not present

## 2018-10-11 DIAGNOSIS — E7849 Other hyperlipidemia: Secondary | ICD-10-CM | POA: Diagnosis not present

## 2018-10-11 DIAGNOSIS — E038 Other specified hypothyroidism: Secondary | ICD-10-CM | POA: Diagnosis not present

## 2018-10-11 DIAGNOSIS — I7300 Raynaud's syndrome without gangrene: Secondary | ICD-10-CM | POA: Diagnosis not present

## 2018-10-11 DIAGNOSIS — R269 Unspecified abnormalities of gait and mobility: Secondary | ICD-10-CM | POA: Diagnosis not present

## 2018-10-11 DIAGNOSIS — I10 Essential (primary) hypertension: Secondary | ICD-10-CM | POA: Diagnosis not present

## 2018-10-11 DIAGNOSIS — E559 Vitamin D deficiency, unspecified: Secondary | ICD-10-CM | POA: Diagnosis not present

## 2018-10-11 DIAGNOSIS — F411 Generalized anxiety disorder: Secondary | ICD-10-CM | POA: Diagnosis not present

## 2018-10-11 DIAGNOSIS — L84 Corns and callosities: Secondary | ICD-10-CM | POA: Diagnosis not present

## 2018-10-11 DIAGNOSIS — E1142 Type 2 diabetes mellitus with diabetic polyneuropathy: Secondary | ICD-10-CM | POA: Diagnosis not present

## 2018-10-11 DIAGNOSIS — E1149 Type 2 diabetes mellitus with other diabetic neurological complication: Secondary | ICD-10-CM | POA: Diagnosis not present

## 2018-10-11 DIAGNOSIS — D126 Benign neoplasm of colon, unspecified: Secondary | ICD-10-CM | POA: Diagnosis not present

## 2018-10-19 ENCOUNTER — Encounter: Payer: Self-pay | Admitting: Gastroenterology

## 2018-11-07 ENCOUNTER — Ambulatory Visit (INDEPENDENT_AMBULATORY_CARE_PROVIDER_SITE_OTHER): Payer: Medicare Other | Admitting: Cardiovascular Disease

## 2018-11-07 ENCOUNTER — Encounter: Payer: Self-pay | Admitting: Cardiovascular Disease

## 2018-11-07 VITALS — BP 112/60 | HR 63 | Ht 68.0 in | Wt 216.8 lb

## 2018-11-07 DIAGNOSIS — E119 Type 2 diabetes mellitus without complications: Secondary | ICD-10-CM | POA: Diagnosis not present

## 2018-11-07 DIAGNOSIS — E039 Hypothyroidism, unspecified: Secondary | ICD-10-CM | POA: Diagnosis not present

## 2018-11-07 DIAGNOSIS — I10 Essential (primary) hypertension: Secondary | ICD-10-CM

## 2018-11-07 DIAGNOSIS — E668 Other obesity: Secondary | ICD-10-CM | POA: Diagnosis not present

## 2018-11-07 DIAGNOSIS — E785 Hyperlipidemia, unspecified: Secondary | ICD-10-CM

## 2018-11-07 NOTE — Patient Instructions (Signed)
Medication Instructions:  The current medical regimen is effective;  continue present plan and medications.  If you need a refill on your cardiac medications before your next appointment, please call your pharmacy.   Follow-Up: At [facility], you and your health needs are our priority.  As part of our continuing mission to provide you with exceptional heart care, we have created designated Provider Care Teams.  These Care Teams include your primary Cardiologist (physician) and Advanced Practice Providers (APPs -  Physician Assistants and Nurse Practitioners) who all work together to provide you with the care you need, when you need it. You will need a follow up appointment in 12 months.  Please call our office 2 months in advance to schedule this appointment.  You may see Thomas Kelly, MD or one of the following Advanced Practice Providers on your designated Care Team: Hao Meng, PA-C . Angela Duke, PA-C       

## 2018-11-07 NOTE — Progress Notes (Signed)
Patient ID: Pamela [name], female   DOB: 1946-03-28, 73 y.o.   MRN: [id number]    HPI: Pamela [name] is a 73 y.o. female who presents for a 13 month followup cardiology evaluation.  Pamela [name] has a long-standing history of obesity, hypertension, type 2 diabetes mellitus, and hyperlipidemia. A Lexiscan Myoview study in March 2014  done for evaluation of chest pain revealed normal perfusion without scar or ischemia. Post-rest ejection fraction was 74%.  She has a history of obesity and when I had seen her in 2013 she had lost approximaltey 20 pounds. She has a history of hyperlipidemia and had been started on lipid-lowering therapy when her NMR lipoprotein LDL particle number was 1834 and LDL of 110, small LDL particles of 1083 and triglycerides of 176. Initially she was taking Crestor 10 mg daily but developed myalgias and reduced the dose to every other day.  She is no longer taking statin therapy.  She is followed by Dr. Carrolyn [name].   In July 2017, she underwent a five-year follow-up echo Doppler study which showed an EF of 60-65%.  There was mild LVH and grade 1 diastolic dysfunction.  She had mildly thickened mitral valve leaflets with trace to mild MR.  I last saw her in January 2019 she denied any  episodes of chest pain orshortness of breath.  She cannot tolerate statins admits to having myalgias with all statins.  She was on levothyroxine for hypothyroidism, carvedilol 6.25 mg twice a day in addition to lisinopril 40 mg daily and HCTZ 25 mg daily for hypertension.  Previously  I had recommended titration of carvedilol to 9.375 mg twice a day, but she felt fatigued and reverted to her prior dose.  Dr. Forde [name] had laboratory.  Recently on 09/21/2007.  Her total cholesterol was 209, triglycerides 150, HDL 63, LDL 116.  She continues to be on JentoDueto XR and glimepiride for her diabetes mellitus.    The past year, she states she has done well.  She has had purposeful weight loss with a weight  reduction from 241 down to 216 pounds.  She was recently started on very low-dose Jardiance at 5 mg per Dr. Forde [name].  She denies any episodes of chest tightness.  She is unaware of palpitations.  She states that she has been undergoing knee injections and may ultimately require knee replacement by Dr. Reynaldo [name].  She presents for a year evaluation.  Past Medical History:  Diagnosis Date  . Diabetes mellitus ([place])   . GERD (gastroesophageal reflux disease)   . Heart murmur   . Hyperlipidemia   . Hypertension   . Thyroid disease    hypo    Past Surgical History:  Procedure Laterality Date  . ABDOMINAL HYSTERECTOMY    . CHOLECYSTECTOMY    . COLONOSCOPY    . TONSILLECTOMY      Allergies  Allergen Reactions  . Demerol [Meperidine] Other (See Comments)    Unspecified   . Macrobid [Nitrofurantoin Macrocrystal] Other (See Comments)    Unspecified   . Meclizine Other (See Comments)    Unspecified   . Sulfa Antibiotics Other (See Comments)    Unspecified     Current Outpatient Medications  Medication Sig Dispense Refill  . aspirin 81 MG tablet Take 162 mg by mouth daily.     . carvedilol (COREG) 6.25 MG tablet Take 1.5 tablets (9.'375mg'$ ) in the MORNING; take 1 tablet (6.'25mg'$ ) in the EVENING 270 tablet 3  . Coenzyme Q10 (CO Q 10) 100  MG CAPS Take 200 mg by mouth daily.    . famotidine (PEPCID) 20 MG tablet Take 20 mg by mouth at bedtime.    Marland Kitchen glimepiride (AMARYL) 2 MG tablet Take 2 mg by mouth daily before breakfast.    . hydrochlorothiazide (HYDRODIURIL) 25 MG tablet Take 1 tablet (25 mg total) by mouth daily. 90 tablet 3  . levothyroxine (SYNTHROID, LEVOTHROID) 137 MCG tablet Take 137 mcg by mouth daily before breakfast. One daily, 1 and 1/2 on Sunday    . Linagliptin-Metformin HCl ER (JENTADUETO XR) 2.01-999 MG TB24 Take 1 tablet by mouth every morning.    Marland Kitchen lisinopril (PRINIVIL,ZESTRIL) 40 MG tablet Take 40 mg by mouth daily.     No current facility-administered medications for  this visit.     Socially she is married to she has one child. There is no tobacco or alcohol use.  ROS General: Negative; No fevers, chills, or night sweats;  HEENT: Negative; No changes in vision or hearing, sinus congestion, difficulty swallowing Pulmonary: Negative; No cough, wheezing, shortness of breath, hemoptysis Cardiovascular: Negative; No chest pain, presyncope, syncope, palpitations Occasional leg swelling GI: Negative; No nausea, vomiting, diarrhea, or abdominal pain GU: Negative; No dysuria, hematuria, or difficulty voiding Musculoskeletal: Knee discomfort Hematologic/Oncology: Negative; no easy bruising, bleeding Endocrine: Negative; no heat/cold intolerance; no diabetes Neuro: Negative; no changes in balance, headaches Skin: Negative; No rashes or skin lesions Psychiatric: Negative; No behavioral problems, depression Sleep: Negative; No snoring, daytime sleepiness, hypersomnolence, bruxism, restless legs, hypnogognic hallucinations, no cataplexy Other comprehensive 14 point system review is negative.   PE BP 112/60   Pulse 63   Ht '5\' 8"'$  (1.727 m)   Wt 216 lb 12.8 oz (98.3 kg)   BMI 32.96 kg/m     Repeat blood pressure by me was 110/60 supine and 114/66 standing  Wt Readings from Last 3 Encounters:  11/07/18 216 lb 12.8 oz (98.3 kg)  09/27/17 241 lb 12.8 oz (109.7 kg)  02/28/17 240 lb (108.9 kg)   General: Alert, oriented, no distress.  Skin: normal turgor, no rashes, warm and dry HEENT: Normocephalic, atraumatic. Pupils equal round and reactive to light; sclera anicteric; extraocular muscles intact;  Nose without nasal septal hypertrophy Mouth/Parynx benign; Mallinpatti scale 3 Neck: No JVD, no carotid bruits; normal carotid upstroke Lungs: clear to ausculatation and percussion; no wheezing or rales Chest wall: without tenderness to palpitation Heart: PMI not displaced, RRR, s1 s2 normal, 1/6 systolic murmur, no diastolic murmur, no rubs, gallops, thrills,  or heaves Abdomen: soft, nontender; no hepatosplenomehaly, BS+; abdominal aorta nontender and not dilated by palpation. Back: no CVA tenderness Pulses 2+ Musculoskeletal: full range of motion, normal strength, no joint deformities Extremities: no clubbing cyanosis or edema, Homan's sign negative  Neurologic: grossly nonfocal; Cranial nerves grossly wnl Psychologic: Normal mood and affect   ECG (independently read by me): Normal sinus rhythm at 63 bpm.  No ectopy.  Normal intervals.  January 2019 ECG (independently read by me): Sinus rhythm at 66 with PAC.  ECG (independently read by me): Sinus rhythm with sinus arrhythmia and quadrigeminal PVCs.  QTc interval 410 ms.  No significant ST segment change.  June 2016 ECG (independently read by me): Normal sinus rhythm at 73 bpm.  No ectopy.  Normal intervals.  December 2015 ECG (independently read by me): Normal sinus rhythm at 65 bpm.  No ectopy.  Normal intervals.  No ST segment changes.    July 2014 ECG: Normal sinus rhythm at 63 beats per minute.  Normal intervals.  LABS:  BMP Latest Ref Rng & Units 03/01/2017 09/26/2014 04/17/2013  Glucose 65 - 99 mg/dL 181(H) 239(H) 172(H)  BUN 8 - 27 mg/dL '17 19 11  '$ Creatinine 0.57 - 1.00 mg/dL 0.78 0.69 0.64  BUN/Creat Ratio 12 - 28 22 - -  Sodium 134 - 144 mmol/L 137 137 139  Potassium 3.5 - 5.2 mmol/L 4.5 4.2 4.4  Chloride 96 - 106 mmol/L 100 102 105  CO2 20 - 29 mmol/L '20 27 26  '$ Calcium 8.7 - 10.3 mg/dL 9.4 9.0 10.1   Hepatic Function Latest Ref Rng & Units 03/01/2017 04/17/2013 05/31/2009  Total Protein 6.0 - 8.5 g/dL 6.5 6.8 6.5  Albumin 3.5 - 4.8 g/dL 3.9 3.9 3.4(L)  AST 0 - 40 IU/L '17 25 26  '$ ALT 0 - 32 IU/L '15 22 24  '$ Alk Phosphatase 39 - 117 IU/L 75 67 76  Total Bilirubin 0.0 - 1.2 mg/dL 0.2 0.4 0.6   CBC Latest Ref Rng & Units 03/01/2017 06/02/2009 06/01/2009  WBC 3.4 - 10.8 x10E3/uL 10.3 11.0(H) 9.6  Hemoglobin 11.1 - 15.9 g/dL 10.3(L) 12.0 12.8  Hematocrit 34.0 - 46.6 % 33.1(L) 35.7(L)  38.4  Platelets 150 - 379 x10E3/uL 374 285 310   Lab Results  Component Value Date   MCV 82 03/01/2017   MCV 86.4 06/02/2009   MCV 86.1 06/01/2009    Lab done on 11/14/2014 at [facility] by Dr. Reynold Bowen.  Glucose was increased at 258.  Total cholesterol 213, triglycerides 207, HDL 59, LDL 113.  LFTs normal.  BUN 19, creatinine 0.8.  Laboratory from 09/23/2017 by Dr. Forde [name] was reviewed.  He 116, current 0.9.  Glucose 230. Lipid studies as above in history of present illness. TSH 1.54.  Free T4 1 0.6.  Vitamin D 40.9.  Laboratory from October 12, 2018 was reviewed.  Lipid studies remain elevated with total cholesterol 229, HDL 70, LDL 123, triglycerides 179.  Hemoglobin A1c 7.1.  Creatinine 1.0.    RADIOLOGY: No results found.  IMPRESSION:  1. Essential hypertension   2. Hyperlipidemia with target LDL less than 70   3. Moderate obesity   4. Hypothyroidism, unspecified type   5. Type 2 diabetes mellitus without complication, without long-term current use of insulin ([place])     ASSESSMENT AND PLAN:  Pamela [name] is a 73 year old female who has a history of moderate obesity, hypertension, type 2 diabetes mellitus, and hypothyroidism.  Most recently, she has been on a multiple drug regimen for blood pressure control consisting of lisinopril 40 mg daily, HCTZ 25 mg in addition to carvedilol 9.375 mg in the morning and 6.25 mg in the evening.  Her blood pressure today when checked by me was excellent.  She is diabetic on combination therapy with glimepiride, Jentadueto Exar 2.01/999 mg recently was started on low-dose Jardiance at 5 mg.  I suspect her Jardiance dose will be increased.  It may be possible if her blood pressure gets low that she can reduce her HCTZ to just 12.5 mg particularly with Jardiance added to her medical regimen.  I commended her on her weight loss.  She is still obese but has lost 25 pounds over the past year.  She has a history of hypothyroidism  and is on levothyroxine 137 mcg daily.  We discussed ideally increased exercise.  However her exercise has been limited by her knee discomfort and she may ultimately require knee replacement.  She continues to have mixed hyperlipidemia and reportedly  could not take statins.  I would suggest perhaps a trial of Zetia she follows up with Dr. Forde [name].  She may also be a candidate for omega-3 fatty acid therapy.  As a diabetic, target LDL is less than 70.  She may also be a candidate for the recently approved benpedoic acid which is not yet on the market.  She was advised to decrease her aspirin to 81 mg.  I will see her in 1 year for reevaluation.  Time  spent: 25 minutes  Troy Sine, MD, [facility]  11/07/2018 6:32 PM

## 2018-11-14 ENCOUNTER — Ambulatory Visit (INDEPENDENT_AMBULATORY_CARE_PROVIDER_SITE_OTHER): Payer: Medicare Other | Admitting: Podiatry

## 2018-11-14 DIAGNOSIS — M79675 Pain in left toe(s): Secondary | ICD-10-CM

## 2018-11-14 DIAGNOSIS — B351 Tinea unguium: Secondary | ICD-10-CM

## 2018-11-14 DIAGNOSIS — M79674 Pain in right toe(s): Secondary | ICD-10-CM

## 2018-11-14 NOTE — Patient Instructions (Signed)

## 2018-11-26 ENCOUNTER — Encounter: Payer: Self-pay | Admitting: Podiatry

## 2018-11-26 NOTE — Progress Notes (Signed)
Subjective: Pamela [name] presents today with painful, thick toenails 1-5 b/l that she cannot cut and which interfere with daily activities.  Pain is aggravated when wearing enclosed shoe gear.  Reynold Bowen, MD is her PCP.    Current Outpatient Medications:  .  aspirin 81 MG tablet, Take 162 mg by mouth daily. , Disp: , Rfl:  .  carvedilol (COREG) 6.25 MG tablet, Take 1.5 tablets (9.375mg ) in the MORNING; take 1 tablet (6.25mg ) in the EVENING, Disp: 270 tablet, Rfl: 3 .  Coenzyme Q10 (CO Q 10) 100 MG CAPS, Take 200 mg by mouth daily., Disp: , Rfl:  .  famotidine (PEPCID) 20 MG tablet, Take 20 mg by mouth at bedtime., Disp: , Rfl:  .  glimepiride (AMARYL) 2 MG tablet, Take 2 mg by mouth daily before breakfast., Disp: , Rfl:  .  hydrochlorothiazide (HYDRODIURIL) 25 MG tablet, Take 1 tablet (25 mg total) by mouth daily., Disp: 90 tablet, Rfl: 3 .  levothyroxine (SYNTHROID, LEVOTHROID) 137 MCG tablet, Take 137 mcg by mouth daily before breakfast. One daily, 1 and 1/2 on Sunday, Disp: , Rfl:  .  Linagliptin-Metformin HCl ER (JENTADUETO XR) 2.01-999 MG TB24, Take 1 tablet by mouth every morning., Disp: , Rfl:  .  lisinopril (PRINIVIL,ZESTRIL) 40 MG tablet, Take 40 mg by mouth daily., Disp: , Rfl:   Allergies  Allergen Reactions  . Demerol [Meperidine] Other (See Comments)    Unspecified   . Macrobid [Nitrofurantoin Macrocrystal] Other (See Comments)    Unspecified   . Meclizine Other (See Comments)    Unspecified   . Sulfa Antibiotics Other (See Comments)    Unspecified     Objective:  Vascular Examination: Capillary refill time immediate x 10 digits.  Dorsalis pedis and Posterior tibial pulses 2/4 b/l  Digital hair present x 10 digits.  Skin temperature gradient WNL b/l.  Dermatological Examination: Skin with normal turgor, texture and tone b/l.  Toenails 1-5 b/l discolored, thick, dystrophic with subungual debris and pain with palpation to nailbeds due to thickness of  nails.  Musculoskeletal: Muscle strength 5/5 to all LE muscle groups.  No gross bony deformities b/l.  No pain, crepitus or joint limitation noted with ROM.   Neurological: Sensation intact with 10 gram monofilament.  Vibratory sensation intact.  Assessment: Painful onychomycosis toenails 1-5 b/l   Plan: 1. Toenails 1-5 b/l were debrided in length and girth without iatrogenic bleeding. 2. Patient to continue soft, supportive shoe gear 3. Patient to report any pedal injuries to medical professional immediately. 4. Follow up 9 weeks.  5. Patient/POA to call should there be a concern in the interim.

## 2018-12-05 ENCOUNTER — Other Ambulatory Visit: Payer: Self-pay | Admitting: Cardiovascular Disease

## 2018-12-21 DIAGNOSIS — H04121 Dry eye syndrome of right lacrimal gland: Secondary | ICD-10-CM | POA: Diagnosis not present

## 2018-12-21 DIAGNOSIS — H02053 Trichiasis without entropian right eye, unspecified eyelid: Secondary | ICD-10-CM | POA: Diagnosis not present

## 2019-01-04 DIAGNOSIS — H02051 Trichiasis without entropian right upper eyelid: Secondary | ICD-10-CM | POA: Diagnosis not present

## 2019-01-04 DIAGNOSIS — H04123 Dry eye syndrome of bilateral lacrimal glands: Secondary | ICD-10-CM | POA: Diagnosis not present

## 2019-01-24 ENCOUNTER — Ambulatory Visit (INDEPENDENT_AMBULATORY_CARE_PROVIDER_SITE_OTHER): Payer: Medicare Other | Admitting: Podiatry

## 2019-01-24 ENCOUNTER — Other Ambulatory Visit: Payer: Self-pay

## 2019-01-24 VITALS — Temp 97.3°F

## 2019-01-24 DIAGNOSIS — M79675 Pain in left toe(s): Secondary | ICD-10-CM

## 2019-01-24 DIAGNOSIS — M79674 Pain in right toe(s): Secondary | ICD-10-CM | POA: Diagnosis not present

## 2019-01-24 DIAGNOSIS — B351 Tinea unguium: Secondary | ICD-10-CM | POA: Diagnosis not present

## 2019-02-01 ENCOUNTER — Encounter: Payer: Self-pay | Admitting: Podiatry

## 2019-02-01 NOTE — Progress Notes (Signed)
Subjective:  Pamela [name] presents to clinic for preventative diabetic foot care today with history of chronic painful, thick, discolored, elongated toenails 1-5 b/l that become tender and cannot cut because of thickness.  Pain is aggravated when wearing enclosed shoe gear and relieved with periodic professional debridement.  Reynold Bowen, MD is her PCP.  Today she relates pain of the left great toe.  She states it is painful on occasion and has been off and on for about 1 month.  Aggravating factor is enclosed shoe gear.  She has done nothing to treat the toe.   Current Outpatient Medications:  .  aspirin 81 MG tablet, Take 162 mg by mouth daily. , Disp: , Rfl:  .  carvedilol (COREG) 6.25 MG tablet, TAKE ONE AND ONE-HALF TABLETS TWICE A DAY WITH MEALS (PLEASE SCHEDULE AN APPOINTMENT FOR FURTHER REFILLS), Disp: 270 tablet, Rfl: 3 .  Coenzyme Q10 (CO Q 10) 100 MG CAPS, Take 200 mg by mouth daily., Disp: , Rfl:  .  famotidine (PEPCID) 20 MG tablet, Take 20 mg by mouth at bedtime., Disp: , Rfl:  .  glimepiride (AMARYL) 2 MG tablet, Take 2 mg by mouth daily before breakfast., Disp: , Rfl:  .  levothyroxine (SYNTHROID, LEVOTHROID) 137 MCG tablet, Take 137 mcg by mouth daily before breakfast. One daily, 1 and 1/2 on Sunday, Disp: , Rfl:  .  Linagliptin-Metformin HCl ER (JENTADUETO XR) 2.01-999 MG TB24, Take 1 tablet by mouth every morning., Disp: , Rfl:  .  lisinopril (PRINIVIL,ZESTRIL) 40 MG tablet, Take 40 mg by mouth daily., Disp: , Rfl:  .  hydrochlorothiazide (HYDRODIURIL) 25 MG tablet, Take 1 tablet (25 mg total) by mouth daily., Disp: 90 tablet, Rfl: 3   Allergies  Allergen Reactions  . Demerol [Meperidine] Other (See Comments)    Unspecified   . Macrobid [Nitrofurantoin Macrocrystal] Other (See Comments)    Unspecified   . Meclizine Other (See Comments)    Unspecified   . Sulfa Antibiotics Other (See Comments)    Unspecified      Objective: Vitals:   01/24/19 1531   Temp: (!) 97.3 F (36.3 C)    Physical Examination:  Vascular Examination: Capillary refill time immediate x 10 digits.  Palpable DP/PT pulses b/l.  Digital hair present b/l.  No edema noted b/l.  Skin temperature gradient WNL b/l.  Dermatological Examination: Skin with normal turgor, texture and tone b/l.  No open wounds b/l.  No interdigital macerations noted b/l.  Elongated, thick, discolored brittle toenails with subungual debris and pain on dorsal palpation of nailbeds 1-5 b/l.  Incurvated nail plate noted medial lateral border left great toe.  There is no erythema, no edema, no drainage, no flocculence noted.  There is tenderness to palpation.  Musculoskeletal Examination: Muscle strength 5/5 to all muscle groups b/l  No pain, crepitus or joint discomfort with active/passive ROM.  Neurological Examination: Sensation intact 5/5 b/l with 10 gram monofilament.  Vibratory sensation intact b/l.  Proprioceptive sensation intact b/l.  Assessment: Mycotic nail infection with pain 1-5 b/l Ingrown toenail left great toe noninfected  Plan: 1. Toenails 1-5 b/l were debrided in length and girth without iatrogenic laceration.  Offending nail borders left great toe debrided and curettage.  Borders cleansed with alcohol.  Triple antibiotic ointment applied.  No further treatment required by patient. 2.  Continue soft, supportive shoe gear daily. 3.  Report any pedal injuries to medical professional. 4.  Follow up 9 weeks. 5.  Patient/POA to call should  there be a question/concern in there interim.

## 2019-02-13 ENCOUNTER — Other Ambulatory Visit: Payer: Self-pay | Admitting: Cardiovascular Disease

## 2019-03-07 DIAGNOSIS — E119 Type 2 diabetes mellitus without complications: Secondary | ICD-10-CM | POA: Diagnosis not present

## 2019-03-08 DIAGNOSIS — E1142 Type 2 diabetes mellitus with diabetic polyneuropathy: Secondary | ICD-10-CM | POA: Diagnosis not present

## 2019-03-08 DIAGNOSIS — R269 Unspecified abnormalities of gait and mobility: Secondary | ICD-10-CM | POA: Diagnosis not present

## 2019-03-08 DIAGNOSIS — E785 Hyperlipidemia, unspecified: Secondary | ICD-10-CM | POA: Diagnosis not present

## 2019-03-08 DIAGNOSIS — E039 Hypothyroidism, unspecified: Secondary | ICD-10-CM | POA: Diagnosis not present

## 2019-03-08 DIAGNOSIS — D126 Benign neoplasm of colon, unspecified: Secondary | ICD-10-CM | POA: Diagnosis not present

## 2019-03-08 DIAGNOSIS — I5189 Other ill-defined heart diseases: Secondary | ICD-10-CM | POA: Diagnosis not present

## 2019-03-08 DIAGNOSIS — I129 Hypertensive chronic kidney disease with stage 1 through stage 4 chronic kidney disease, or unspecified chronic kidney disease: Secondary | ICD-10-CM | POA: Diagnosis not present

## 2019-03-08 DIAGNOSIS — H8110 Benign paroxysmal vertigo, unspecified ear: Secondary | ICD-10-CM | POA: Diagnosis not present

## 2019-03-08 DIAGNOSIS — E559 Vitamin D deficiency, unspecified: Secondary | ICD-10-CM | POA: Diagnosis not present

## 2019-03-08 DIAGNOSIS — E669 Obesity, unspecified: Secondary | ICD-10-CM | POA: Diagnosis not present

## 2019-03-08 DIAGNOSIS — N183 Chronic kidney disease, stage 3 (moderate): Secondary | ICD-10-CM | POA: Diagnosis not present

## 2019-03-08 DIAGNOSIS — E1149 Type 2 diabetes mellitus with other diabetic neurological complication: Secondary | ICD-10-CM | POA: Diagnosis not present

## 2019-03-20 DIAGNOSIS — M545 Low back pain: Secondary | ICD-10-CM | POA: Diagnosis not present

## 2019-03-20 DIAGNOSIS — M419 Scoliosis, unspecified: Secondary | ICD-10-CM | POA: Diagnosis not present

## 2019-03-20 DIAGNOSIS — M5136 Other intervertebral disc degeneration, lumbar region: Secondary | ICD-10-CM | POA: Diagnosis not present

## 2019-03-20 DIAGNOSIS — M1711 Unilateral primary osteoarthritis, right knee: Secondary | ICD-10-CM | POA: Diagnosis not present

## 2019-03-20 DIAGNOSIS — M48062 Spinal stenosis, lumbar region with neurogenic claudication: Secondary | ICD-10-CM | POA: Diagnosis not present

## 2019-03-20 DIAGNOSIS — M48061 Spinal stenosis, lumbar region without neurogenic claudication: Secondary | ICD-10-CM | POA: Diagnosis not present

## 2019-03-26 DIAGNOSIS — Z0000 Encounter for general adult medical examination without abnormal findings: Secondary | ICD-10-CM | POA: Insufficient documentation

## 2019-03-28 ENCOUNTER — Encounter: Payer: Self-pay | Admitting: Podiatry

## 2019-03-28 ENCOUNTER — Other Ambulatory Visit: Payer: Self-pay

## 2019-03-28 ENCOUNTER — Ambulatory Visit (INDEPENDENT_AMBULATORY_CARE_PROVIDER_SITE_OTHER): Payer: Medicare Other | Admitting: Podiatry

## 2019-03-28 DIAGNOSIS — B351 Tinea unguium: Secondary | ICD-10-CM | POA: Diagnosis not present

## 2019-03-28 DIAGNOSIS — M79674 Pain in right toe(s): Secondary | ICD-10-CM

## 2019-03-28 DIAGNOSIS — M79675 Pain in left toe(s): Secondary | ICD-10-CM

## 2019-03-28 NOTE — Patient Instructions (Signed)
Diabetes Mellitus and Foot Care Foot care is an important part of your health, especially when you have diabetes. Diabetes may cause you to have problems because of poor blood flow (circulation) to your feet and legs, which can cause your skin to:  Become thinner and drier.  Break more easily.  Heal more slowly.  Peel and crack. You may also have nerve damage (neuropathy) in your legs and feet, causing decreased feeling in them. This means that you may not notice minor injuries to your feet that could lead to more serious problems. Noticing and addressing any potential problems early is the best way to prevent future foot problems. How to care for your feet Foot hygiene  Wash your feet daily with warm water and mild soap. Do not use hot water. Then, pat your feet and the areas between your toes until they are completely dry. Do not soak your feet as this can dry your skin.  Trim your toenails straight across. Do not dig under them or around the cuticle. File the edges of your nails with an emery board or nail file.  Apply a moisturizing lotion or petroleum jelly to the skin on your feet and to dry, brittle toenails. Use lotion that does not contain alcohol and is unscented. Do not apply lotion between your toes. Shoes and socks  Wear clean socks or stockings every day. Make sure they are not too tight. Do not wear knee-high stockings since they may decrease blood flow to your legs.  Wear shoes that fit properly and have enough cushioning. Always look in your shoes before you put them on to be sure there are no objects inside.  To break in new shoes, wear them for just a few hours a day. This prevents injuries on your feet. Wounds, scrapes, corns, and calluses  Check your feet daily for blisters, cuts, bruises, sores, and redness. If you cannot see the bottom of your feet, use a mirror or ask someone for help.  Do not cut corns or calluses or try to remove them with medicine.  If you  find a minor scrape, cut, or break in the skin on your feet, keep it and the skin around it clean and dry. You may clean these areas with mild soap and water. Do not clean the area with peroxide, alcohol, or iodine.  If you have a wound, scrape, corn, or callus on your foot, look at it several times a day to make sure it is healing and not infected. Check for: ? Redness, swelling, or pain. ? Fluid or blood. ? Warmth. ? Pus or a bad smell. General instructions  Do not cross your legs. This may decrease blood flow to your feet.  Do not use heating pads or hot water bottles on your feet. They may burn your skin. If you have lost feeling in your feet or legs, you may not know this is happening until it is too late.  Protect your feet from hot and cold by wearing shoes, such as at the beach or on hot pavement.  Schedule a complete foot exam at least once a year (annually) or more often if you have foot problems. If you have foot problems, report any cuts, sores, or bruises to your health care provider immediately. Contact a health care provider if:  You have a medical condition that increases your risk of infection and you have any cuts, sores, or bruises on your feet.  You have an injury that is not   healing.  You have redness on your legs or feet.  You feel burning or tingling in your legs or feet.  You have pain or cramps in your legs and feet.  Your legs or feet are numb.  Your feet always feel cold.  You have pain around a toenail. Get help right away if:  You have a wound, scrape, corn, or callus on your foot and: ? You have pain, swelling, or redness that gets worse. ? You have fluid or blood coming from the wound, scrape, corn, or callus. ? Your wound, scrape, corn, or callus feels warm to the touch. ? You have pus or a bad smell coming from the wound, scrape, corn, or callus. ? You have a fever. ? You have a red line going up your leg. Summary  Check your feet every day  for cuts, sores, red spots, swelling, and blisters.  Moisturize feet and legs daily.  Wear shoes that fit properly and have enough cushioning.  If you have foot problems, report any cuts, sores, or bruises to your health care provider immediately.  Schedule a complete foot exam at least once a year (annually) or more often if you have foot problems. This information is not intended to replace advice given to you by your health care provider. Make sure you discuss any questions you have with your health care provider. Document Released: 08/27/2000 Document Revised: 10/12/2017 Document Reviewed: 10/01/2016 Elsevier Patient Education  2020 [employer]   Onychomycosis/Fungal Toenails  WHAT IS IT? An infection that lies within the keratin of your nail plate that is caused by a fungus.  WHY ME? Fungal infections affect all ages, sexes, races, and creeds.  There may be many factors that predispose you to a fungal infection such as age, coexisting medical conditions such as diabetes, or an autoimmune disease; stress, medications, fatigue, genetics, etc.  Bottom line: fungus thrives in a warm, moist environment and your shoes offer such a location.  IS IT CONTAGIOUS? Theoretically, yes.  You do not want to share shoes, nail clippers or files with someone who has fungal toenails.  Walking around barefoot in the same room or sleeping in the same bed is unlikely to transfer the organism.  It is important to realize, however, that fungus can spread easily from one nail to the next on the same foot.  HOW DO WE TREAT THIS?  There are several ways to treat this condition.  Treatment may depend on many factors such as age, medications, pregnancy, liver and kidney conditions, etc.  It is best to ask your doctor which options are available to you.  1. No treatment.   Unlike many other medical concerns, you can live with this condition.  However for many people this can be a painful condition and may lead to  ingrown toenails or a bacterial infection.  It is recommended that you keep the nails cut short to help reduce the amount of fungal nail. 2. Topical treatment.  These range from herbal remedies to prescription strength nail lacquers.  About 40-50% effective, topicals require twice daily application for approximately 9 to 12 months or until an entirely new nail has grown out.  The most effective topicals are medical grade medications available through physicians offices. 3. Oral antifungal medications.  With an 80-90% cure rate, the most common oral medication requires 3 to 4 months of therapy and stays in your system for a year as the new nail grows out.  Oral antifungal medications do require   blood work to make sure it is a safe drug for you.  A liver function panel will be performed prior to starting the medication and after the first month of treatment.  It is important to have the blood work performed to avoid any harmful side effects.  In general, this medication safe but blood work is required. 4. Laser Therapy.  This treatment is performed by applying a specialized laser to the affected nail plate.  This therapy is noninvasive, fast, and non-painful.  It is not covered by insurance and is therefore, out of pocket.  The results have been very good with a 80-95% cure rate.  [facility] is the only practice in the area to offer this therapy. 5. Permanent Nail Avulsion.  Removing the entire nail so that a new nail will not grow back. 

## 2019-04-01 NOTE — Progress Notes (Signed)
Subjective:  Pamela [name] presents to clinic today with cc of  painful, thick, discolored, elongated toenails 1-5 b/l that become tender and cannot cut because of thickness.  Pain is aggravated when wearing enclosed shoe gear.   Current Outpatient Medications:  .  aspirin 81 MG tablet, Take 162 mg by mouth daily. , Disp: , Rfl:  .  carvedilol (COREG) 6.25 MG tablet, TAKE ONE AND ONE-HALF TABLETS TWICE A DAY WITH MEALS (PLEASE SCHEDULE AN APPOINTMENT FOR FURTHER REFILLS), Disp: 270 tablet, Rfl: 3 .  Coenzyme Q10 (CO Q 10) 100 MG CAPS, Take 200 mg by mouth daily., Disp: , Rfl:  .  famotidine (PEPCID) 20 MG tablet, Take 20 mg by mouth at bedtime., Disp: , Rfl:  .  glimepiride (AMARYL) 2 MG tablet, Take 2 mg by mouth daily before breakfast., Disp: , Rfl:  .  hydrochlorothiazide (HYDRODIURIL) 25 MG tablet, TAKE 1 TABLET DAILY, Disp: 90 tablet, Rfl: 3 .  levothyroxine (SYNTHROID, LEVOTHROID) 137 MCG tablet, Take 137 mcg by mouth daily before breakfast. One daily, 1 and 1/2 on Sunday, Disp: , Rfl:  .  Linagliptin-Metformin HCl ER (JENTADUETO XR) 2.01-999 MG TB24, Take 1 tablet by mouth every morning., Disp: , Rfl:  .  lisinopril (PRINIVIL,ZESTRIL) 40 MG tablet, Take 40 mg by mouth daily., Disp: , Rfl:    Allergies  Allergen Reactions  . Demerol [Meperidine] Other (See Comments)    Unspecified   . Macrobid [Nitrofurantoin Macrocrystal] Other (See Comments)    Unspecified   . Meclizine Other (See Comments)    Unspecified   . Sulfa Antibiotics Other (See Comments)    Unspecified      Objective: Physical Examination:  Vascular Examination: Capillary refill time immediate x 10 digits.  Palpable DP/PT pulses b/l.  Digital hair present b/l.  No edema noted b/l.  Skin temperature gradient WNL b/l.  Dermatological Examination: Skin with normal turgor, texture and tone b/l.  No open wounds b/l.  No interdigital macerations noted b/l.  Elongated, thick, discolored brittle  toenails with subungual debris and pain on dorsal palpation of nailbeds 1-5 b/l.  Musculoskeletal Examination: Muscle strength 5/5 to all muscle groups b/l  No pain, crepitus or joint discomfort with active/passive ROM.  Neurological Examination: Sensation intact 5/5 b/l with 10 gram monofilament.  Vibratory sensation intact b/l.  Proprioceptive sensation intact b/l.  Assessment: Mycotic nail infection with pain 1-5 b/l  Plan: 1. Toenails 1-5 b/l were debrided in length and girth without iatrogenic laceration. 2.  Continue soft, supportive shoe gear daily. 3.  Report any pedal injuries to medical professional. 4.  Follow up 3 months. 5.  Patient/POA to call should there be a question/concern in there interim.

## 2019-04-10 DIAGNOSIS — M545 Low back pain: Secondary | ICD-10-CM | POA: Diagnosis not present

## 2019-04-12 DIAGNOSIS — H2512 Age-related nuclear cataract, left eye: Secondary | ICD-10-CM | POA: Diagnosis not present

## 2019-04-12 DIAGNOSIS — E119 Type 2 diabetes mellitus without complications: Secondary | ICD-10-CM | POA: Diagnosis not present

## 2019-04-12 DIAGNOSIS — H43821 Vitreomacular adhesion, right eye: Secondary | ICD-10-CM | POA: Diagnosis not present

## 2019-04-12 DIAGNOSIS — H2511 Age-related nuclear cataract, right eye: Secondary | ICD-10-CM | POA: Diagnosis not present

## 2019-04-12 DIAGNOSIS — H43822 Vitreomacular adhesion, left eye: Secondary | ICD-10-CM | POA: Diagnosis not present

## 2019-04-16 DIAGNOSIS — M545 Low back pain: Secondary | ICD-10-CM | POA: Diagnosis not present

## 2019-04-19 DIAGNOSIS — M545 Low back pain: Secondary | ICD-10-CM | POA: Diagnosis not present

## 2019-04-24 DIAGNOSIS — M5136 Other intervertebral disc degeneration, lumbar region: Secondary | ICD-10-CM | POA: Diagnosis not present

## 2019-04-24 DIAGNOSIS — M545 Low back pain: Secondary | ICD-10-CM | POA: Diagnosis not present

## 2019-04-27 DIAGNOSIS — M5136 Other intervertebral disc degeneration, lumbar region: Secondary | ICD-10-CM | POA: Diagnosis not present

## 2019-04-30 DIAGNOSIS — M545 Low back pain: Secondary | ICD-10-CM | POA: Diagnosis not present

## 2019-05-03 DIAGNOSIS — M545 Low back pain: Secondary | ICD-10-CM | POA: Diagnosis not present

## 2019-05-08 DIAGNOSIS — M5136 Other intervertebral disc degeneration, lumbar region: Secondary | ICD-10-CM | POA: Diagnosis not present

## 2019-05-16 DIAGNOSIS — M545 Low back pain: Secondary | ICD-10-CM | POA: Diagnosis not present

## 2019-05-25 DIAGNOSIS — M48061 Spinal stenosis, lumbar region without neurogenic claudication: Secondary | ICD-10-CM | POA: Diagnosis not present

## 2019-05-29 ENCOUNTER — Ambulatory Visit: Payer: Medicare Other | Admitting: Obstetrics & Gynecology

## 2019-06-01 DIAGNOSIS — M545 Low back pain: Secondary | ICD-10-CM | POA: Diagnosis not present

## 2019-06-12 ENCOUNTER — Other Ambulatory Visit: Payer: Self-pay

## 2019-06-12 ENCOUNTER — Ambulatory Visit (INDEPENDENT_AMBULATORY_CARE_PROVIDER_SITE_OTHER): Payer: Medicare Other | Admitting: Podiatry

## 2019-06-12 ENCOUNTER — Encounter: Payer: Self-pay | Admitting: Podiatry

## 2019-06-12 DIAGNOSIS — L600 Ingrowing nail: Secondary | ICD-10-CM

## 2019-06-12 DIAGNOSIS — M79675 Pain in left toe(s): Secondary | ICD-10-CM | POA: Diagnosis not present

## 2019-06-12 DIAGNOSIS — L03032 Cellulitis of left toe: Secondary | ICD-10-CM | POA: Diagnosis not present

## 2019-06-12 DIAGNOSIS — M79674 Pain in right toe(s): Secondary | ICD-10-CM

## 2019-06-12 DIAGNOSIS — B351 Tinea unguium: Secondary | ICD-10-CM

## 2019-06-12 DIAGNOSIS — L02612 Cutaneous abscess of left foot: Secondary | ICD-10-CM | POA: Diagnosis not present

## 2019-06-12 MED ORDER — AMOXICILLIN 500 MG PO CAPS: 500.0000 mg | ORAL_CAPSULE | Freq: Two times a day (BID) | ORAL | 0 refills | Status: DC

## 2019-06-12 NOTE — Patient Instructions (Addendum)
EPSOM SALT FOOT SOAK INSTRUCTIONS  1.  Place 1/4 cup of epsom salts in 2 quarts of warm tap water. IF YOU ARE DIABETIC, OR HAVE NEUROPATHY,  CHECK THE TEMPERATURE OF THE WATER WITH YOUR ELBOW.  2.  Submerge your foot/feet in the solution and soak for 20 minutes.      3.  Next, remove your foot or feet from solution, blot dry the affected area.    4.  Apply antibiotic ointment and cover with fabric band-aid .  5.  This soak should be done once a day for 10 days.   6.  Monitor for any signs/symptoms of infection such as redness, swelling, odor, drainage, increased pain,  or non-healing of digit.   7.  Please do not hesitate to call the office and speak to a Nurse or Doctor if you have questions.   8.  If you experience fever, chills, nightsweats, nausea or vomiting with worsening of digit, please go to the emergency room.     Ingrown Toenail An ingrown toenail occurs when the corner or sides of a toenail grow into the surrounding skin. This causes discomfort and pain. The big toe is most commonly affected, but any of the toes can be affected. If an ingrown toenail is not treated, it can become infected. What are the causes? This condition may be caused by:  Wearing shoes that are too small or tight.  An injury, such as stubbing your toe or having your toe stepped on.  Improper cutting or care of your toenails.  Having nail or foot abnormalities that were present from birth (congenital abnormalities), such as having a nail that is too big for your toe. What increases the risk? The following factors may make you more likely to develop ingrown toenails:  Age. Nails tend to get thicker with age, so ingrown nails are more common among older people.  Cutting your toenails incorrectly, such as cutting them very short or cutting them unevenly. An ingrown toenail is more likely to get infected if you have:  Diabetes.  Blood flow (circulation) problems. What are the signs or symptoms?  Symptoms of an ingrown toenail may include:  Pain, soreness, or tenderness.  Redness.  Swelling.  Hardening of the skin that surrounds the toenail. Signs that an ingrown toenail may be infected include:  Fluid or pus.  Symptoms that get worse instead of better. How is this diagnosed? An ingrown toenail may be diagnosed based on your medical history, your symptoms, and a physical exam. If you have fluid or blood coming from your toenail, a sample may be collected to test for the specific type of bacteria that is causing the infection. How is this treated? Treatment depends on how severe your ingrown toenail is. You may be able to care for your toenail at home.  If you have an infection, you may be prescribed antibiotic medicines.  If you have fluid or pus draining from your toenail, your health care provider may drain it.  If you have trouble walking, you may be given crutches to use.  If you have a severe or infected ingrown toenail, you may need a procedure to remove part or all of the nail. Follow these instructions at home: Foot care   Do not pick at your toenail or try to remove it yourself.  Soak your foot in warm, soapy water. Do this for 20 minutes, 3 times a day, or as often as told by your health care provider. This helps to   keep your toe clean and keep your skin soft.  Wear shoes that fit well and are not too tight. Your health care provider may recommend that you wear open-toed shoes while you heal.  Trim your toenails regularly and carefully. Cut your toenails straight across to prevent injury to the skin at the corners of the toenail. Do not cut your nails in a curved shape.  Keep your feet clean and dry to help prevent infection. Medicines  Take over-the-counter and prescription medicines only as told by your health care provider.  If you were prescribed an antibiotic, take it as told by your health care provider. Do not stop taking the antibiotic even if you  start to feel better. Activity  Return to your normal activities as told by your health care provider. Ask your health care provider what activities are safe for you.  Avoid activities that cause pain. General instructions  If your health care provider told you to use crutches to help you move around, use them as instructed.  Keep all follow-up visits as told by your health care provider. This is important. Contact a health care provider if:  You have more redness, swelling, pain, or other symptoms that do not improve with treatment.  You have fluid, blood, or pus coming from your toenail. Get help right away if:  You have a red streak on your skin that starts at your foot and spreads up your leg.  You have a fever. Summary  An ingrown toenail occurs when the corner or sides of a toenail grow into the surrounding skin. This causes discomfort and pain. The big toe is most commonly affected, but any of the toes can be affected.  If an ingrown toenail is not treated, it can become infected.  Fluid or pus draining from your toenail is a sign of infection. Your health care provider may need to drain it. You may be given antibiotics to treat the infection.  Trimming your toenails regularly and properly can help you prevent an ingrown toenail. This information is not intended to replace advice given to you by your health care provider. Make sure you discuss any questions you have with your health care provider. Document Released: 08/27/2000 Document Revised: 12/22/2018 Document Reviewed: 05/18/2017 Elsevier Patient Education  2020 [employer]  

## 2019-06-13 NOTE — Progress Notes (Signed)
Subjective: Pamela [name] is seen today for follow up painful, elongated, thickened toenails 1-5 b/l feet that she cannot cut. Pain interferes with daily activities. Aggravating factor includes wearing enclosed shoe gear and relieved with periodic debridement.  Patient states she has been having pain in her left great toe for the past few days.  She states it is very tender to the touch and it is difficult for her to wear enclosed shoe gear.  She denies any redness, drainage or swelling.  She denies any fever, chills, night sweats, nausea or vomiting.  Current Outpatient Medications on File Prior to Visit  Medication Sig  . aspirin 81 MG tablet Take 162 mg by mouth daily.   . carvedilol (COREG) 6.25 MG tablet TAKE ONE AND ONE-HALF TABLETS TWICE A DAY WITH MEALS (PLEASE SCHEDULE AN APPOINTMENT FOR FURTHER REFILLS)  . Coenzyme Q10 (CO Q 10) 100 MG CAPS Take 200 mg by mouth daily.  . famotidine (PEPCID) 20 MG tablet Take 20 mg by mouth at bedtime.  Marland Kitchen glimepiride (AMARYL) 2 MG tablet Take 2 mg by mouth daily before breakfast.  . hydrochlorothiazide (HYDRODIURIL) 25 MG tablet TAKE 1 TABLET DAILY  . levothyroxine (SYNTHROID, LEVOTHROID) 137 MCG tablet Take 137 mcg by mouth daily before breakfast. One daily, 1 and 1/2 on Sunday  . Linagliptin-Metformin HCl ER (JENTADUETO XR) 2.01-999 MG TB24 Take 1 tablet by mouth every morning.  Marland Kitchen lisinopril (PRINIVIL,ZESTRIL) 40 MG tablet Take 40 mg by mouth daily.   No current facility-administered medications on file prior to visit.      Allergies  Allergen Reactions  . Demerol [Meperidine] Other (See Comments)    Unspecified   . Macrobid [Nitrofurantoin Macrocrystal] Other (See Comments)    Unspecified   . Meclizine Other (See Comments)    Unspecified   . Sulfa Antibiotics Other (See Comments)    Unspecified    Objective:  Vascular Examination: Capillary refill time immediate x 10 digits.  Dorsalis pedis present b/l.  Posterior tibial pulses  present b/l.  Digital hair  present x 10 digits.  Skin temperature gradient WNL b/l.   Dermatological Examination: Skin with normal turgor, texture and tone b/l.  Toenails 1-5 right foot, 2 through 5 left foot discolored, thick, dystrophic with subungual debris and pain with palpation to nailbeds due to thickness of nails.  Left hallux noted to have an incurvated nail plate at the medial border.  Mild erythema.  No edema, no drainage. Digit was very tender to touch. No purulence was expressed.  Musculoskeletal: Muscle strength 5/5 to all LE muscle groups.  No gross bony deformities b/l.  No pain, crepitus or joint limitation noted with ROM.   Neurological Examination: Protective sensation intact with 10 gram monofilament bilaterally.  Epicritic sensation present bilaterally.  Vibratory sensation intact bilaterally.   Assessment: Painful onychomycosis toenails 1-5 right foot, 2 through 5 left foot Painful ingrown toenail medial border left great toe Cellulitis left great toe  Plan: 1. Toenails 1-5 right foot, 2 through 5 left foot were debrided in length and girth without iatrogenic bleeding 2. Discussed diagnosis of with treatment options. 3. Patient/POA agreed to have temporary nail avulsion of the left hallux medial border. 4. Consent form signed and in chart. Prepped great toe with Betadine. Local injection of  3 cc's 50/50 mix of 1% Lidocaine plain and 0.5% marcaine plain administered via hallux block. Offending nail medial border left hallux freed utilizing elevator. Offending nail plate removed with hemostat. Border cleansed with alcohol. Light  bleeding controlled with Lumicain hemostatic solution. Light dressng applied. Discused post-procedure instructions of epsom salt warm water soaks and dispensed written handout. Prescription sent to pharmacy for amoxicillin 500 mg.  Patient to take 1 capsule by mouth twice daily for 10 days. Patient/POA related understanding of  post-procedure instructions. Follow up 2 weeks.  Call office if there is increased redness, swelling, drainage, odor or pain. Patient to continue soft, supportive shoe gear as tolerated. Patient/POA to call should there be a concern in the interim.

## 2019-06-15 ENCOUNTER — Ambulatory Visit: Payer: Medicare Other | Admitting: Obstetrics & Gynecology

## 2019-06-19 ENCOUNTER — Other Ambulatory Visit: Payer: Self-pay

## 2019-06-20 ENCOUNTER — Encounter: Payer: Self-pay | Admitting: Obstetrics & Gynecology

## 2019-06-20 ENCOUNTER — Ambulatory Visit (INDEPENDENT_AMBULATORY_CARE_PROVIDER_SITE_OTHER): Payer: Medicare Other | Admitting: Obstetrics & Gynecology

## 2019-06-20 VITALS — BP 140/82 | Ht 65.0 in | Wt 216.0 lb

## 2019-06-20 DIAGNOSIS — Z780 Asymptomatic menopausal state: Secondary | ICD-10-CM

## 2019-06-20 DIAGNOSIS — Z9079 Acquired absence of other genital organ(s): Secondary | ICD-10-CM

## 2019-06-20 DIAGNOSIS — Z90710 Acquired absence of both cervix and uterus: Secondary | ICD-10-CM

## 2019-06-20 DIAGNOSIS — Z90722 Acquired absence of ovaries, bilateral: Secondary | ICD-10-CM | POA: Diagnosis not present

## 2019-06-20 DIAGNOSIS — Z6835 Body mass index (BMI) 35.0-35.9, adult: Secondary | ICD-10-CM | POA: Diagnosis not present

## 2019-06-20 DIAGNOSIS — Z01419 Encounter for gynecological examination (general) (routine) without abnormal findings: Secondary | ICD-10-CM

## 2019-06-20 DIAGNOSIS — R350 Frequency of micturition: Secondary | ICD-10-CM | POA: Diagnosis not present

## 2019-06-20 NOTE — Progress Notes (Signed)
Pamela [name] 18-Apr-1946 [id number]   History:    73 y.o. G1P1L1 Married.  RP:  New patient presenting for annual gyn exam   HPI: TAH/BSO 1996 Benign.  Well on no hormone replacement therapy.  No pelvic pain.  Abstinent.  Urinary frequency.  Bowel movements normal.  Breasts normal.  Body mass index 35.94.  Not very physically active.  DM type II followed by Dr Forde Dandy.  Health labs with Dr. Forde Dandy.  Past medical history,surgical history, family history and social history were all reviewed and documented in the EPIC chart.  Gynecologic History No LMP recorded. Patient has had a hysterectomy. Contraception: status post hysterectomy Last Pap: 2005. Results were: Negative Last mammogram: 09/2018. Results were: Negative Bone Density: 08/2017 Normal. Colonoscopy: 5 years ago, will schedule now  Obstetric History OB History  Gravida Para Term Preterm AB Living  1 1       1   SAB TAB Ectopic Multiple Live Births               # Outcome Date GA Lbr Len/2nd Weight Sex Delivery Anes PTL Lv  1 Para              ROS: A ROS was performed and pertinent positives and negatives are included in the history.  GENERAL: No fevers or chills. HEENT: No change in vision, no earache, sore throat or sinus congestion. NECK: No pain or stiffness. CARDIOVASCULAR: No chest pain or pressure. No palpitations. PULMONARY: No shortness of breath, cough or wheeze. GASTROINTESTINAL: No abdominal pain, nausea, vomiting or diarrhea, melena or bright red blood per rectum. GENITOURINARY: No urinary frequency, urgency, hesitancy or dysuria. MUSCULOSKELETAL: No joint or muscle pain, no back pain, no recent trauma. DERMATOLOGIC: No rash, no itching, no lesions. ENDOCRINE: No polyuria, polydipsia, no heat or cold intolerance. No recent change in weight. HEMATOLOGICAL: No anemia or easy bruising or bleeding. NEUROLOGIC: No headache, seizures, numbness, tingling or weakness. PSYCHIATRIC: No depression, no loss of interest in  normal activity or change in sleep pattern.     Exam:   BP 140/82   Ht 5\' 5"  (1.651 m)   Wt 216 lb (98 kg)   BMI 35.94 kg/m   Body mass index is 35.94 kg/m.  General appearance : Well developed well nourished female. No acute distress HEENT: Eyes: no retinal hemorrhage or exudates,  Neck supple, trachea midline, no carotid bruits, no thyroidmegaly Lungs: Clear to auscultation, no rhonchi or wheezes, or rib retractions  Heart: Regular rate and rhythm, no murmurs or gallops Breast:Examined in sitting and supine position were symmetrical in appearance, no palpable masses or tenderness,  no skin retraction, no nipple inversion, no nipple discharge, no skin discoloration, no axillary or supraclavicular lymphadenopathy Abdomen: no palpable masses or tenderness, no rebound or guarding Extremities: no edema or skin discoloration or tenderness  Pelvic: Vulva: Normal             Vagina: No gross lesions or discharge  Cervix/Uterus absent  Adnexa  Without masses or tenderness  Rectal exam:  Normal  U/A: Yellow, clear.  Protein negative, Nitrite negative, WBC 6-10, RBC negative, few Bacteria.  Pending U. Culture.   Assessment/Plan:  73 y.o. female for annual exam   1. Well female exam with routine gynecological exam Gynecologic exam status post TAH/BSO.  No indication to perform Pap tests at this point.  Breast exam normal.  Last screening mammogram January 2020 was negative.  Will schedule colonoscopy now at 5 years.  Health  labs with Dr. Forde Dandy who also manages her diabetes mellitus type 2.  2. Postmenopause Well on no hormone replacement therapy.  Bone density normal in December 2018, will repeat at 5 years.  Vitamin D supplements, calcium intake of 1200 mg daily and regular weightbearing physical activity is recommended.  3. S/P TAH-BSO  4. Urinary frequency Mildly perturbed urine analysis, will wait on culture to decide on treatment. - Urinalysis,Complete w/RFL Culture  5. Class  2 severe obesity due to excess calories with serious comorbidity and body mass index (BMI) of 35.0 to 35.9 in adult [facility]) Recommend a lower calorie/carb diet such as [employer], respecting the needs associated with diabetes type 2.  Aerobic physical activities 5 times a week and weightlifting every 2 days.  Other orders - empagliflozin (JARDIANCE) 10 MG TABS tablet; Take 10 mg by mouth daily. 5mg  in the morning  Princess Bruins MD, 2:46 PM 06/20/2019

## 2019-06-21 ENCOUNTER — Encounter: Payer: Self-pay | Admitting: Obstetrics & Gynecology

## 2019-06-21 NOTE — Patient Instructions (Addendum)
1. Well female exam with routine gynecological exam Gynecologic exam status post TAH/BSO.  No indication to perform Pap tests at this point.  Breast exam normal.  Last screening mammogram January 2020 was negative.  Will schedule colonoscopy now at 5 years.  Health labs with Dr. Forde Dandy who also manages her diabetes mellitus type 2.  2. Postmenopause Well on no hormone replacement therapy.  Bone density normal in December 2018, will repeat at 5 years.  Vitamin D supplements, calcium intake of 1200 mg daily and regular weightbearing physical activity is recommended.  3. S/P TAH-BSO  4. Urinary frequency Mildly perturbed urine analysis, will wait on culture to decide on treatment. - Urinalysis,Complete w/RFL Culture  5. Class 2 severe obesity due to excess calories with serious comorbidity and body mass index (BMI) of 35.0 to 35.9 in adult [facility]) Recommend a lower calorie/carb diet such as [employer], respecting the needs associated with diabetes type 2.  Aerobic physical activities 5 times a week and weightlifting every 2 days.  Other orders - empagliflozin (JARDIANCE) 10 MG TABS tablet; Take 10 mg by mouth daily. 5mg  in the morning  Fujie, it was a pleasure meeting you today!

## 2019-06-23 LAB — URINE CULTURE
MICRO NUMBER:: 964307
SPECIMEN QUALITY:: ADEQUATE

## 2019-06-23 LAB — URINALYSIS, COMPLETE W/RFL CULTURE
Bilirubin Urine: NEGATIVE
Hgb urine dipstick: NEGATIVE
Hyaline Cast: NONE SEEN /LPF
Ketones, ur: NEGATIVE
Leukocyte Esterase: NEGATIVE
Nitrites, Initial: NEGATIVE
Protein, ur: NEGATIVE
RBC / HPF: NONE SEEN /HPF (ref 0–2)
Specific Gravity, Urine: 1.001 (ref 1.001–1.03)
pH: 5 (ref 5.0–8.0)

## 2019-06-23 LAB — CULTURE INDICATED

## 2019-06-25 ENCOUNTER — Telehealth: Payer: Self-pay

## 2019-06-25 DIAGNOSIS — M545 Low back pain: Secondary | ICD-10-CM | POA: Diagnosis not present

## 2019-06-25 NOTE — Telephone Encounter (Signed)
Patient called to check on urine culture result.

## 2019-06-25 NOTE — Telephone Encounter (Signed)
Left message to call me.

## 2019-06-25 NOTE — Telephone Encounter (Signed)
U.Culture: Klebsiella Pneumonia.  Treat with Ciprofloxacin 500 mg BID x 7 days.  Please send prescription.

## 2019-06-26 ENCOUNTER — Other Ambulatory Visit: Payer: Self-pay

## 2019-06-26 ENCOUNTER — Ambulatory Visit: Payer: Medicare Other

## 2019-06-26 DIAGNOSIS — L600 Ingrowing nail: Secondary | ICD-10-CM

## 2019-06-26 MED ORDER — CIPROFLOXACIN HCL 500 MG PO TABS: 500.0000 mg | ORAL_TABLET | Freq: Two times a day (BID) | ORAL | 0 refills | Status: DC

## 2019-06-26 NOTE — Progress Notes (Signed)
Patient is here today for a follow up appt, recent procedure performed on 9.29.20, removal of ingrown toenail. She states that the area is slightly sore but she continues to soak and bandage her toe daily.   No redness, no erythema, no swelling, no drainage, no other s/s of infection. The area is scabbed over and healing well at this time.    Discussed s/s of infection, verbal and written instructions were given. She is to follow up with any acute symptom changes

## 2019-06-26 NOTE — Telephone Encounter (Signed)
Spoke with patient and informed her. Rx sent. 

## 2019-06-28 DIAGNOSIS — H25013 Cortical age-related cataract, bilateral: Secondary | ICD-10-CM | POA: Diagnosis not present

## 2019-06-28 DIAGNOSIS — H35033 Hypertensive retinopathy, bilateral: Secondary | ICD-10-CM | POA: Diagnosis not present

## 2019-06-28 DIAGNOSIS — H2513 Age-related nuclear cataract, bilateral: Secondary | ICD-10-CM | POA: Diagnosis not present

## 2019-06-28 DIAGNOSIS — E119 Type 2 diabetes mellitus without complications: Secondary | ICD-10-CM | POA: Diagnosis not present

## 2019-06-29 DIAGNOSIS — E1165 Type 2 diabetes mellitus with hyperglycemia: Secondary | ICD-10-CM | POA: Diagnosis not present

## 2019-07-03 DIAGNOSIS — R269 Unspecified abnormalities of gait and mobility: Secondary | ICD-10-CM | POA: Diagnosis not present

## 2019-07-03 DIAGNOSIS — E1142 Type 2 diabetes mellitus with diabetic polyneuropathy: Secondary | ICD-10-CM | POA: Diagnosis not present

## 2019-07-03 DIAGNOSIS — D126 Benign neoplasm of colon, unspecified: Secondary | ICD-10-CM | POA: Diagnosis not present

## 2019-07-03 DIAGNOSIS — I5189 Other ill-defined heart diseases: Secondary | ICD-10-CM | POA: Diagnosis not present

## 2019-07-03 DIAGNOSIS — E039 Hypothyroidism, unspecified: Secondary | ICD-10-CM | POA: Diagnosis not present

## 2019-07-03 DIAGNOSIS — I129 Hypertensive chronic kidney disease with stage 1 through stage 4 chronic kidney disease, or unspecified chronic kidney disease: Secondary | ICD-10-CM | POA: Diagnosis not present

## 2019-07-03 DIAGNOSIS — F411 Generalized anxiety disorder: Secondary | ICD-10-CM | POA: Diagnosis not present

## 2019-07-03 DIAGNOSIS — E785 Hyperlipidemia, unspecified: Secondary | ICD-10-CM | POA: Diagnosis not present

## 2019-07-03 DIAGNOSIS — Z0000 Encounter for general adult medical examination without abnormal findings: Secondary | ICD-10-CM | POA: Diagnosis not present

## 2019-07-03 DIAGNOSIS — N1831 Chronic kidney disease, stage 3a: Secondary | ICD-10-CM | POA: Diagnosis not present

## 2019-07-03 DIAGNOSIS — E1149 Type 2 diabetes mellitus with other diabetic neurological complication: Secondary | ICD-10-CM | POA: Diagnosis not present

## 2019-07-03 DIAGNOSIS — E559 Vitamin D deficiency, unspecified: Secondary | ICD-10-CM | POA: Diagnosis not present

## 2019-07-10 DIAGNOSIS — M545 Low back pain: Secondary | ICD-10-CM | POA: Diagnosis not present

## 2019-07-25 DIAGNOSIS — M48061 Spinal stenosis, lumbar region without neurogenic claudication: Secondary | ICD-10-CM | POA: Diagnosis not present

## 2019-09-05 ENCOUNTER — Ambulatory Visit (INDEPENDENT_AMBULATORY_CARE_PROVIDER_SITE_OTHER): Payer: Medicare Other | Admitting: Podiatry

## 2019-09-05 ENCOUNTER — Encounter: Payer: Self-pay | Admitting: Podiatry

## 2019-09-05 ENCOUNTER — Other Ambulatory Visit: Payer: Self-pay

## 2019-09-05 DIAGNOSIS — M79675 Pain in left toe(s): Secondary | ICD-10-CM | POA: Diagnosis not present

## 2019-09-05 DIAGNOSIS — M79674 Pain in right toe(s): Secondary | ICD-10-CM

## 2019-09-05 DIAGNOSIS — B351 Tinea unguium: Secondary | ICD-10-CM | POA: Diagnosis not present

## 2019-09-05 DIAGNOSIS — E119 Type 2 diabetes mellitus without complications: Secondary | ICD-10-CM

## 2019-09-05 NOTE — Patient Instructions (Signed)
Ingrown Toenail An ingrown toenail occurs when the corner or sides of a toenail grow into the surrounding skin. This causes discomfort and pain. The big toe is most commonly affected, but any of the toes can be affected. If an ingrown toenail is not treated, it can become infected. What are the causes? This condition may be caused by:  Wearing shoes that are too small or tight.  An injury, such as stubbing your toe or having your toe stepped on.  Improper cutting or care of your toenails.  Having nail or foot abnormalities that were present from birth (congenital abnormalities), such as having a nail that is too big for your toe. What increases the risk? The following factors may make you more likely to develop ingrown toenails:  Age. Nails tend to get thicker with age, so ingrown nails are more common among older people.  Cutting your toenails incorrectly, such as cutting them very short or cutting them unevenly. An ingrown toenail is more likely to get infected if you have:  Diabetes.  Blood flow (circulation) problems. What are the signs or symptoms? Symptoms of an ingrown toenail may include:  Pain, soreness, or tenderness.  Redness.  Swelling.  Hardening of the skin that surrounds the toenail. Signs that an ingrown toenail may be infected include:  Fluid or pus.  Symptoms that get worse instead of better. How is this diagnosed? An ingrown toenail may be diagnosed based on your medical history, your symptoms, and a physical exam. If you have fluid or blood coming from your toenail, a sample may be collected to test for the specific type of bacteria that is causing the infection. How is this treated? Treatment depends on how severe your ingrown toenail is. You may be able to care for your toenail at home.  If you have an infection, you may be prescribed antibiotic medicines.  If you have fluid or pus draining from your toenail, your health care provider may drain  it.  If you have trouble walking, you may be given crutches to use.  If you have a severe or infected ingrown toenail, you may need a procedure to remove part or all of the nail. Follow these instructions at home: Foot care   Do not pick at your toenail or try to remove it yourself.  Soak your foot in warm, soapy water. Do this for 20 minutes, 3 times a day, or as often as told by your health care provider. This helps to keep your toe clean and keep your skin soft.  Wear shoes that fit well and are not too tight. Your health care provider may recommend that you wear open-toed shoes while you heal.  Trim your toenails regularly and carefully. Cut your toenails straight across to prevent injury to the skin at the corners of the toenail. Do not cut your nails in a curved shape.  Keep your feet clean and dry to help prevent infection. Medicines  Take over-the-counter and prescription medicines only as told by your health care provider.  If you were prescribed an antibiotic, take it as told by your health care provider. Do not stop taking the antibiotic even if you start to feel better. Activity  Return to your normal activities as told by your health care provider. Ask your health care provider what activities are safe for you.  Avoid activities that cause pain. General instructions  If your health care provider told you to use crutches to help you move around, use them   as instructed.  Keep all follow-up visits as told by your health care provider. This is important. Contact a health care provider if:  You have more redness, swelling, pain, or other symptoms that do not improve with treatment.  You have fluid, blood, or pus coming from your toenail. Get help right away if:  You have a red streak on your skin that starts at your foot and spreads up your leg.  You have a fever. Summary  An ingrown toenail occurs when the corner or sides of a toenail grow into the surrounding  skin. This causes discomfort and pain. The big toe is most commonly affected, but any of the toes can be affected.  If an ingrown toenail is not treated, it can become infected.  Fluid or pus draining from your toenail is a sign of infection. Your health care provider may need to drain it. You may be given antibiotics to treat the infection.  Trimming your toenails regularly and properly can help you prevent an ingrown toenail. This information is not intended to replace advice given to you by your health care provider. Make sure you discuss any questions you have with your health care provider. Document Released: 08/27/2000 Document Revised: 12/22/2018 Document Reviewed: 05/18/2017 Elsevier Patient Education  2020 [employer].

## 2019-09-10 NOTE — Progress Notes (Signed)
Subjective: Pamela [name] is a 73 y.o. y.o. female with h/o diabetes who presents today for preventative diabetic foot care. Patient has painful, elongated mycotic toenailswhich pose a risk and interfere with daily activities. Pain is aggravated when wearing enclosed shoe gear and relieved with periodic professional debridement.  She is s/p partial nail avulsion left hallux medial border and states toe feels fine now.   She states she is being followed by [facility] for her back and right knee pain. She is now using a cane for balance. Also in PT for her back. Notes improvement with PT.   Reynold Bowen, MD is patient's PCP. Last visit was 03/08/2019.  Medications reviewed in chart.  Allergies  Allergen Reactions  . Demerol [Meperidine] Other (See Comments)    Unspecified   . Macrobid [Nitrofurantoin Macrocrystal] Other (See Comments)    Unspecified   . Meclizine Other (See Comments)    Unspecified   . Sulfa Antibiotics Other (See Comments)    Unspecified    Objective: There were no vitals filed for this visit.  Vascular Examination: Capillary refill time immediate b/l.  Dorsalis pedis and posterior tibial pulses palpable b/l.  Digital hair present b/l.  Skin temperature gradient WNL b/l.  Dermatological Examination: Skin with normal turgor, texture and tone b/l.  Toenails 1-5 b/l discolored, thick, dystrophic with subungual debris and pain with palpation to nailbeds due to thickness of nails.  Medial border left hallux completely healed.  No open wounds.   No interdigital macerations.  Musculoskeletal: Muscle strength 5/5 to all LE muscle groups b/l.  Neurological: Sensation intact 5/5 b/l with 10 gram monofilament.  Vibratory sensation intact b/l.  Assessment: 1. Painful onychomycosis toenails 1-5 b/l 2.   NIDDM  Plan: 1. Continue diabetic foot care principles. Literature dispensed on today. 2. Toenails 1-5 b/l were debrided in length and girth  without iatrogenic bleeding. 3. Patient to continue soft, supportive shoe gear daily. 4. Patient to report any pedal injuries to medical professional immediately. 5. Follow up 3 months..  6. Patient/POA to call should there be a concern in the interim.

## 2019-10-25 DIAGNOSIS — E1149 Type 2 diabetes mellitus with other diabetic neurological complication: Secondary | ICD-10-CM | POA: Diagnosis not present

## 2019-10-30 DIAGNOSIS — E1149 Type 2 diabetes mellitus with other diabetic neurological complication: Secondary | ICD-10-CM | POA: Diagnosis not present

## 2019-10-30 DIAGNOSIS — N1831 Chronic kidney disease, stage 3a: Secondary | ICD-10-CM | POA: Insufficient documentation

## 2019-10-30 DIAGNOSIS — D126 Benign neoplasm of colon, unspecified: Secondary | ICD-10-CM | POA: Diagnosis not present

## 2019-10-30 DIAGNOSIS — R269 Unspecified abnormalities of gait and mobility: Secondary | ICD-10-CM | POA: Diagnosis not present

## 2019-10-30 DIAGNOSIS — E039 Hypothyroidism, unspecified: Secondary | ICD-10-CM | POA: Diagnosis not present

## 2019-10-30 DIAGNOSIS — I7300 Raynaud's syndrome without gangrene: Secondary | ICD-10-CM | POA: Diagnosis not present

## 2019-10-30 DIAGNOSIS — I129 Hypertensive chronic kidney disease with stage 1 through stage 4 chronic kidney disease, or unspecified chronic kidney disease: Secondary | ICD-10-CM | POA: Diagnosis not present

## 2019-10-30 DIAGNOSIS — I5189 Other ill-defined heart diseases: Secondary | ICD-10-CM | POA: Diagnosis not present

## 2019-10-30 DIAGNOSIS — E559 Vitamin D deficiency, unspecified: Secondary | ICD-10-CM | POA: Diagnosis not present

## 2019-10-30 DIAGNOSIS — E669 Obesity, unspecified: Secondary | ICD-10-CM | POA: Diagnosis not present

## 2019-10-30 DIAGNOSIS — E785 Hyperlipidemia, unspecified: Secondary | ICD-10-CM | POA: Diagnosis not present

## 2019-10-30 DIAGNOSIS — E1142 Type 2 diabetes mellitus with diabetic polyneuropathy: Secondary | ICD-10-CM | POA: Diagnosis not present

## 2019-11-06 DIAGNOSIS — E559 Vitamin D deficiency, unspecified: Secondary | ICD-10-CM | POA: Diagnosis not present

## 2019-11-06 DIAGNOSIS — E1149 Type 2 diabetes mellitus with other diabetic neurological complication: Secondary | ICD-10-CM | POA: Diagnosis not present

## 2019-11-06 DIAGNOSIS — N1831 Chronic kidney disease, stage 3a: Secondary | ICD-10-CM | POA: Diagnosis not present

## 2019-11-06 DIAGNOSIS — R7989 Other specified abnormal findings of blood chemistry: Secondary | ICD-10-CM | POA: Diagnosis not present

## 2019-11-06 DIAGNOSIS — E038 Other specified hypothyroidism: Secondary | ICD-10-CM | POA: Diagnosis not present

## 2019-11-06 DIAGNOSIS — E7849 Other hyperlipidemia: Secondary | ICD-10-CM | POA: Diagnosis not present

## 2019-11-09 ENCOUNTER — Encounter (INDEPENDENT_AMBULATORY_CARE_PROVIDER_SITE_OTHER): Payer: Self-pay

## 2019-11-09 ENCOUNTER — Ambulatory Visit (INDEPENDENT_AMBULATORY_CARE_PROVIDER_SITE_OTHER): Payer: Medicare Other | Admitting: Cardiovascular Disease

## 2019-11-09 ENCOUNTER — Other Ambulatory Visit: Payer: Self-pay

## 2019-11-09 ENCOUNTER — Encounter: Payer: Self-pay | Admitting: Cardiovascular Disease

## 2019-11-09 VITALS — BP 123/61 | HR 60 | Temp 97.2°F | Ht 66.0 in | Wt 219.6 lb

## 2019-11-09 DIAGNOSIS — E039 Hypothyroidism, unspecified: Secondary | ICD-10-CM

## 2019-11-09 DIAGNOSIS — I10 Essential (primary) hypertension: Secondary | ICD-10-CM

## 2019-11-09 DIAGNOSIS — E119 Type 2 diabetes mellitus without complications: Secondary | ICD-10-CM | POA: Diagnosis not present

## 2019-11-09 DIAGNOSIS — E668 Other obesity: Secondary | ICD-10-CM

## 2019-11-09 DIAGNOSIS — E785 Hyperlipidemia, unspecified: Secondary | ICD-10-CM | POA: Diagnosis not present

## 2019-11-09 DIAGNOSIS — M25473 Effusion, unspecified ankle: Secondary | ICD-10-CM

## 2019-11-09 DIAGNOSIS — E669 Obesity, unspecified: Secondary | ICD-10-CM

## 2019-11-09 MED ORDER — CARVEDILOL 6.25 MG PO TABS: ORAL_TABLET | ORAL | 3 refills | Status: DC

## 2019-11-09 MED ORDER — HYDROCHLOROTHIAZIDE 25 MG PO TABS: 25.0000 mg | ORAL_TABLET | Freq: Every day | ORAL | 3 refills | Status: DC

## 2019-11-09 MED ORDER — EZETIMIBE 10 MG PO TABS: 10.0000 mg | ORAL_TABLET | Freq: Every day | ORAL | 2 refills | Status: DC

## 2019-11-09 NOTE — Patient Instructions (Addendum)
Medication Instructions:  BEGIN ZETIA 10MG  DAILY REFILL ON HCTZ AND CARVEDILOL  *If you need a refill on your cardiac medications before your next appointment, please call your pharmacy*   Lab Work: IN 3-4 MONTHS: FASTING LABWORK LIPID CMET  If you have labs (blood work) drawn today and your tests are completely normal, you will receive your results only by: Marland Kitchen MyChart Message (if you have MyChart) OR . A paper copy in the mail If you have any lab test that is abnormal or we need to change your treatment, we will call you to review the results.  Follow-Up: At [facility], you and your health needs are our priority.  As part of our continuing mission to provide you with exceptional heart care, we have created designated Provider Care Teams.  These Care Teams include your primary Cardiologist (physician) and Advanced Practice Providers (APPs -  Physician Assistants and Nurse Practitioners) who all work together to provide you with the care you need, when you need it.  We recommend signing up for the patient portal called "MyChart".  Sign up information is provided on this After Visit Summary.  MyChart is used to connect with patients for Virtual Visits (Telemedicine).  Patients are able to view lab/test results, encounter notes, upcoming appointments, etc.  Non-urgent messages can be sent to your provider as well.   To learn more about what you can do with MyChart, go to [email or web address].    Your next appointment:   6 month(s)  The format for your next appointment:   In Person  Provider:   Shelva Majestic, MD

## 2019-11-09 NOTE — Progress Notes (Signed)
Patient ID: Pamela [name], female   DOB: 1946-09-12, 74 y.o.   MRN: [id number]    HPI: Pamela [name] is a 74 y.o. female who presents for a 12 month followup cardiology evaluation.  Ms. Divirgilio has a long-standing history of obesity, hypertension, type 2 diabetes mellitus, and hyperlipidemia. A Lexiscan Myoview study in March 2014  done for evaluation of chest pain revealed normal perfusion without scar or ischemia. Post-rest ejection fraction was 74%.  She has a history of obesity and when I had seen her in 2013 she had lost approximaltey 20 pounds. She has a history of hyperlipidemia and had been started on lipid-lowering therapy when her NMR lipoprotein LDL particle number was 1834 and LDL of 110, small LDL particles of 1083 and triglycerides of 176. Initially she was taking Crestor 10 mg daily but developed myalgias and reduced the dose to every other day.  She is no longer taking statin therapy.  She is followed by Dr. Carrolyn Meiers.   In July 2017, she underwent a five-year follow-up echo Doppler study which showed an EF of 60-65%.  There was mild LVH and grade 1 diastolic dysfunction.  She had mildly thickened mitral valve leaflets with trace to mild MR.  I last saw her in January 2019 she denied any  episodes of chest pain orshortness of breath.  She cannot tolerate statins admits to having myalgias with all statins.  She was on levothyroxine for hypothyroidism, carvedilol 6.25 mg twice a day in addition to lisinopril 40 mg daily and HCTZ 25 mg daily for hypertension.  Previously  I had recommended titration of carvedilol to 9.375 mg twice a day, but she felt fatigued and reverted to her prior dose.  Dr. Forde Dandy had laboratory.  Recently on 09/21/2007.  Her total cholesterol was 209, triglycerides 150, HDL 63, LDL 116.  She continues to be on JentoDueto XR and glimepiride for her diabetes mellitus.    I last saw her in February 2020.  She  had purposeful weight loss with a weight reduction from 241 down  to 216 pounds.  She was recently started on very low-dose Jardiance at 5 mg per Dr. Forde Dandy.  She denied any episodes of chest tightness.  She was unaware of palpitations.  She  has been undergoing knee injections and may ultimately require knee replacement by Dr. Reynaldo Minium.    Since I last saw her, Ms. Allor denies any episodes of chest pain.  Her weight has been fairly constant with only 2 to 3 pound weight difference over the past year.  She continues to have issues with right knee soreness and may ultimately require knee replacement.  She has undergone injections.  She walks with a cane.  At times she notes trace leg swelling vitals.  She apparently is statin intolerant.  Most recently she has been taking carvedilol 9.375 mg in the morning and 6.25 mg at night, HCTZ 25 mg in addition to lisinopril 40 mg for hypertension.  She is on levothyroxine for hypothyroidism at 137 mcg.  With her diabetes she is on glimepiride, Jardiance, and Jentadueto XR.  She presents for your evaluation.  Past Medical History:  Diagnosis Date  . Diabetes mellitus ([place])   . GERD (gastroesophageal reflux disease)   . Heart murmur   . Hyperlipidemia   . Hypertension   . Thyroid disease    hypo    Past Surgical History:  Procedure Laterality Date  . ABDOMINAL HYSTERECTOMY    . CHOLECYSTECTOMY    .  COLONOSCOPY    . TONSILLECTOMY      Allergies  Allergen Reactions  . Demerol [Meperidine] Other (See Comments)    Unspecified   . Macrobid [Nitrofurantoin Macrocrystal] Other (See Comments)    Unspecified   . Meclizine Other (See Comments)    Unspecified   . Sulfa Antibiotics Other (See Comments)    Unspecified     Current Outpatient Medications  Medication Sig Dispense Refill  . aspirin 81 MG tablet Take 162 mg by mouth daily.     . carvedilol (COREG) 6.25 MG tablet TAKE ONE AND ONE-HALF TABLETS TWICE A DAY WITH MEALS (PLEASE SCHEDULE AN APPOINTMENT FOR FURTHER REFILLS) 270 tablet 3  . Coenzyme Q10 (CO Q 10)  100 MG CAPS Take 200 mg by mouth daily.    . empagliflozin (JARDIANCE) 10 MG TABS tablet Take 5 mg by mouth daily. '5mg'$  in the morning     . famotidine (PEPCID) 20 MG tablet Take 20 mg by mouth at bedtime.    Marland Kitchen glimepiride (AMARYL) 2 MG tablet Take 2 mg by mouth daily before breakfast.    . hydrochlorothiazide (HYDRODIURIL) 25 MG tablet Take 1 tablet (25 mg total) by mouth daily. 90 tablet 3  . levothyroxine (SYNTHROID, LEVOTHROID) 137 MCG tablet Take 137 mcg by mouth daily before breakfast. One daily, 1 and 1/2 on Sunday    . Linagliptin-Metformin HCl ER (JENTADUETO XR) 2.01-999 MG TB24 Take 2 tablets by mouth every morning.     Marland Kitchen lisinopril (PRINIVIL,ZESTRIL) 40 MG tablet Take 40 mg by mouth daily.    Marland Kitchen amoxicillin (AMOXIL) 500 MG capsule Take 1 capsule (500 mg total) by mouth 2 (two) times daily. (Patient not taking: Reported on 11/09/2019) 20 capsule 0  . ciprofloxacin (CIPRO) 500 MG tablet Take 1 tablet (500 mg total) by mouth 2 (two) times daily. (Patient not taking: Reported on 11/09/2019) 14 tablet 0  . ezetimibe (ZETIA) 10 MG tablet Take 1 tablet (10 mg total) by mouth daily. 30 tablet 2   No current facility-administered medications for this visit.    Socially she is married to she has one child. There is no tobacco or alcohol use.  ROS General: Negative; No fevers, chills, or night sweats;  HEENT: Negative; No changes in vision or hearing, sinus congestion, difficulty swallowing Pulmonary: Negative; No cough, wheezing, shortness of breath, hemoptysis Cardiovascular: Negative; No chest pain, presyncope, syncope, palpitations Occasional leg swelling GI: Negative; No nausea, vomiting, diarrhea, or abdominal pain GU: Negative; No dysuria, hematuria, or difficulty voiding Musculoskeletal: Knee discomfort Hematologic/Oncology: Negative; no easy bruising, bleeding Endocrine: Positive for hypothyroidism and diabetes mellitus Neuro: Negative; no changes in balance, headaches Skin:  Negative; No rashes or skin lesions Psychiatric: Negative; No behavioral problems, depression Sleep: Negative; No snoring, daytime sleepiness, hypersomnolence, bruxism, restless legs, hypnogognic hallucinations, no cataplexy Other comprehensive 14 point system review is negative.   PE BP 123/61   Pulse 60   Temp (!) 97.2 F (36.2 C)   Ht '5\' 6"'$  (1.676 m)   Wt 219 lb 9.6 oz (99.6 kg)   SpO2 98%   BMI 35.44 kg/m     Repeat blood pressure by me 120/64  Wt Readings from Last 3 Encounters:  11/09/19 219 lb 9.6 oz (99.6 kg)  06/20/19 216 lb (98 kg)  11/07/18 216 lb 12.8 oz (98.3 kg)    General: Alert, oriented, no distress.  Skin: normal turgor, no rashes, warm and dry HEENT: Normocephalic, atraumatic. Pupils equal round and reactive to light; sclera anicteric;  extraocular muscles intact;  Nose without nasal septal hypertrophy Mouth/Parynx benign; Mallinpatti scale 3 Neck: No JVD, no carotid bruits; normal carotid upstroke Lungs: clear to ausculatation and percussion; no wheezing or rales  Chest wall: without tenderness to palpitation Heart: PMI not displaced, RRR, s1 s2 normal, 1/6 systolic murmur, no diastolic murmur, no rubs, gallops, thrills, or heaves Abdomen: soft, nontender; no hepatosplenomehaly, BS+; abdominal aorta nontender and not dilated by palpation. Back: no CVA tenderness Pulses 2+ Musculoskeletal: full range of motion, normal strength, no joint deformities Extremities: Trivial ankle edema no clubbing cyanosis, Homan's sign negative  Neurologic: grossly nonfocal; Cranial nerves grossly wnl Psychologic: Normal mood and affect   ECG (independently read by me): Sinus rhythm at 60 bpm.  No ectopy.  Normal intervals  February 2020 ECG (independently read by me): Normal sinus rhythm at 63 bpm.  No ectopy.  Normal intervals.  January 2019 ECG (independently read by me): Sinus rhythm at 66 with PAC.  ECG (independently read by me): Sinus rhythm with sinus arrhythmia  and quadrigeminal PVCs.  QTc interval 410 ms.  No significant ST segment change.  June 2016 ECG (independently read by me): Normal sinus rhythm at 73 bpm.  No ectopy.  Normal intervals.  December 2015 ECG (independently read by me): Normal sinus rhythm at 65 bpm.  No ectopy.  Normal intervals.  No ST segment changes.    July 2014 ECG: Normal sinus rhythm at 63 beats per minute. Normal intervals.  LABS:  BMP Latest Ref Rng & Units 03/01/2017 09/26/2014 04/17/2013  Glucose 65 - 99 mg/dL 181(H) 239(H) 172(H)  BUN 8 - 27 mg/dL '17 19 11  '$ Creatinine 0.57 - 1.00 mg/dL 0.78 0.69 0.64  BUN/Creat Ratio 12 - 28 22 - -  Sodium 134 - 144 mmol/L 137 137 139  Potassium 3.5 - 5.2 mmol/L 4.5 4.2 4.4  Chloride 96 - 106 mmol/L 100 102 105  CO2 20 - 29 mmol/L '20 27 26  '$ Calcium 8.7 - 10.3 mg/dL 9.4 9.0 10.1   Hepatic Function Latest Ref Rng & Units 03/01/2017 04/17/2013 05/31/2009  Total Protein 6.0 - 8.5 g/dL 6.5 6.8 6.5  Albumin 3.5 - 4.8 g/dL 3.9 3.9 3.4(L)  AST 0 - 40 IU/L '17 25 26  '$ ALT 0 - 32 IU/L '15 22 24  '$ Alk Phosphatase 39 - 117 IU/L 75 67 76  Total Bilirubin 0.0 - 1.2 mg/dL 0.2 0.4 0.6   CBC Latest Ref Rng & Units 03/01/2017 06/02/2009 06/01/2009  WBC 3.4 - 10.8 x10E3/uL 10.3 11.0(H) 9.6  Hemoglobin 11.1 - 15.9 g/dL 10.3(L) 12.0 12.8  Hematocrit 34.0 - 46.6 % 33.1(L) 35.7(L) 38.4  Platelets 150 - 379 x10E3/uL 374 285 310   Lab Results  Component Value Date   MCV 82 03/01/2017   MCV 86.4 06/02/2009   MCV 86.1 06/01/2009    Lab done on 11/14/2014 at [facility] by Dr. Reynold Bowen.  Glucose was increased at 258.  Total cholesterol 213, triglycerides 207, HDL 59, LDL 113.  LFTs normal.  BUN 19, creatinine 0.8.  Laboratory from 09/23/2017 by Dr. Forde Dandy was reviewed.  He 116, current 0.9.  Glucose 230. Lipid studies as above in history of present illness. TSH 1.54.  Free T4 1 0.6.  Vitamin D 40.9.  Laboratory from October 12, 2018 was reviewed.  Lipid studies remain elevated with  total cholesterol 229, HDL 70, LDL 123, triglycerides 179.  Hemoglobin A1c 7.1.  Creatinine 1.0.  Laboratory from November 06, 2019:  Total cholesterol  190, HDL 64, LDL 108, triglycerides 90. Hemoglobin A1c 6.7 BUN 19 creatinine 0.9 TSH 0.51  RADIOLOGY: No results found.  IMPRESSION:  1. Essential hypertension   2. Hyperlipidemia with target LDL less than 70   3. Moderate obesity   4. Type 2 diabetes mellitus without complication, without long-term current use of insulin ([place])   5. Ankle edema   6. Hypothyroidism, unspecified type     ASSESSMENT AND PLAN:  Ms. Naziah Portee is a 74 year old female who has a history of moderate obesity, hypertension, type 2 diabetes mellitus, and hypothyroidism.  She currently is on a multiple drug regimen for blood pressure control consisting of lisinopril 40 mg daily, HCTZ 25 mg in addition to carvedilol 9.375 mg in the morning and 6.25 mg in the evening.  Her blood pressure continues to be stable.  She has noticed some trace ankle edema.  I have suggested the addition of compression stockings to assist with her leg swelling.  Alternatively I did suggest that on certain days she may take an extra 12.5 mg HCTZ if there is significant edema.  Her ECG today is stable with resting pulse at 60.  I reviewed recent laboratory.  She is diabetic.  LDL cholesterol is 108.  Target LDL ideally is less than 70.  She apparently is statin intolerant.  I have suggested perhaps a trial of Zetia 10 mg or alternatively she may be a candidate for bempedoic acid as an alternative to statin therapy.  She is diabetic on multi medical regimen consisting of combination linagliptin and Metformin, in addition to glimepiride and Jardiance.  She continues to be on aspirin 81 mg.  She continues to be on levothyroxine 137 mcg for hypothyroidism, followed by Dr. Forde Dandy.  I recommended follow-up lipid studies in 3 to 4 months with the addition of Zetia, and I will see her in 6 months for  reevaluation.  Troy Sine, MD, [facility]  11/11/2019 4:31 PM

## 2019-11-11 ENCOUNTER — Encounter: Payer: Self-pay | Admitting: Cardiovascular Disease

## 2019-11-13 ENCOUNTER — Ambulatory Visit: Payer: Medicare Other | Admitting: Podiatry

## 2019-11-20 ENCOUNTER — Encounter: Payer: Self-pay | Admitting: Podiatry

## 2019-11-20 ENCOUNTER — Other Ambulatory Visit: Payer: Self-pay

## 2019-11-20 ENCOUNTER — Ambulatory Visit (INDEPENDENT_AMBULATORY_CARE_PROVIDER_SITE_OTHER): Payer: Medicare Other | Admitting: Podiatry

## 2019-11-20 VITALS — Temp 96.8°F

## 2019-11-20 DIAGNOSIS — M2011 Hallux valgus (acquired), right foot: Secondary | ICD-10-CM

## 2019-11-20 DIAGNOSIS — B351 Tinea unguium: Secondary | ICD-10-CM | POA: Diagnosis not present

## 2019-11-20 DIAGNOSIS — E119 Type 2 diabetes mellitus without complications: Secondary | ICD-10-CM | POA: Diagnosis not present

## 2019-11-20 DIAGNOSIS — M79674 Pain in right toe(s): Secondary | ICD-10-CM | POA: Diagnosis not present

## 2019-11-20 DIAGNOSIS — M2012 Hallux valgus (acquired), left foot: Secondary | ICD-10-CM | POA: Diagnosis not present

## 2019-11-20 DIAGNOSIS — M79675 Pain in left toe(s): Secondary | ICD-10-CM

## 2019-11-20 NOTE — Patient Instructions (Signed)
Diabetes Mellitus and Foot Care Foot care is an important part of your health, especially when you have diabetes. Diabetes may cause you to have problems because of poor blood flow (circulation) to your feet and legs, which can cause your skin to:  Become thinner and drier.  Break more easily.  Heal more slowly.  Peel and crack. You may also have nerve damage (neuropathy) in your legs and feet, causing decreased feeling in them. This means that you may not notice minor injuries to your feet that could lead to more serious problems. Noticing and addressing any potential problems early is the best way to prevent future foot problems. How to care for your feet Foot hygiene  Wash your feet daily with warm water and mild soap. Do not use hot water. Then, pat your feet and the areas between your toes until they are completely dry. Do not soak your feet as this can dry your skin.  Trim your toenails straight across. Do not dig under them or around the cuticle. File the edges of your nails with an emery board or nail file.  Apply a moisturizing lotion or petroleum jelly to the skin on your feet and to dry, brittle toenails. Use lotion that does not contain alcohol and is unscented. Do not apply lotion between your toes. Shoes and socks  Wear clean socks or stockings every day. Make sure they are not too tight. Do not wear knee-high stockings since they may decrease blood flow to your legs.  Wear shoes that fit properly and have enough cushioning. Always look in your shoes before you put them on to be sure there are no objects inside.  To break in new shoes, wear them for just a few hours a day. This prevents injuries on your feet. Wounds, scrapes, corns, and calluses  Check your feet daily for blisters, cuts, bruises, sores, and redness. If you cannot see the bottom of your feet, use a mirror or ask someone for help.  Do not cut corns or calluses or try to remove them with medicine.  If you  find a minor scrape, cut, or break in the skin on your feet, keep it and the skin around it clean and dry. You may clean these areas with mild soap and water. Do not clean the area with peroxide, alcohol, or iodine.  If you have a wound, scrape, corn, or callus on your foot, look at it several times a day to make sure it is healing and not infected. Check for: ? Redness, swelling, or pain. ? Fluid or blood. ? Warmth. ? Pus or a bad smell. General instructions  Do not cross your legs. This may decrease blood flow to your feet.  Do not use heating pads or hot water bottles on your feet. They may burn your skin. If you have lost feeling in your feet or legs, you may not know this is happening until it is too late.  Protect your feet from hot and cold by wearing shoes, such as at the beach or on hot pavement.  Schedule a complete foot exam at least once a year (annually) or more often if you have foot problems. If you have foot problems, report any cuts, sores, or bruises to your health care provider immediately. Contact a health care provider if:  You have a medical condition that increases your risk of infection and you have any cuts, sores, or bruises on your feet.  You have an injury that is not   healing.  You have redness on your legs or feet.  You feel burning or tingling in your legs or feet.  You have pain or cramps in your legs and feet.  Your legs or feet are numb.  Your feet always feel cold.  You have pain around a toenail. Get help right away if:  You have a wound, scrape, corn, or callus on your foot and: ? You have pain, swelling, or redness that gets worse. ? You have fluid or blood coming from the wound, scrape, corn, or callus. ? Your wound, scrape, corn, or callus feels warm to the touch. ? You have pus or a bad smell coming from the wound, scrape, corn, or callus. ? You have a fever. ? You have a red line going up your leg. Summary  Check your feet every day  for cuts, sores, red spots, swelling, and blisters.  Moisturize feet and legs daily.  Wear shoes that fit properly and have enough cushioning.  If you have foot problems, report any cuts, sores, or bruises to your health care provider immediately.  Schedule a complete foot exam at least once a year (annually) or more often if you have foot problems. This information is not intended to replace advice given to you by your health care provider. Make sure you discuss any questions you have with your health care provider. Document Revised: 05/23/2019 Document Reviewed: 10/01/2016 Elsevier Patient Education  2020 [employer]  

## 2019-11-27 NOTE — Progress Notes (Signed)
Subjective: Pamela [name] presents today for follow up of preventative diabetic foot care, for diabetic foot evaluation and painful mycotic nails b/l that are difficult to trim. Pain interferes with ambulation. Aggravating factors include wearing enclosed shoe gear. Pain is relieved with periodic professional debridement.   She voices no new pedal concerns on today's visit.  Allergies  Allergen Reactions  . Demerol [Meperidine] Other (See Comments)    Unspecified   . Macrobid [Nitrofurantoin Macrocrystal] Other (See Comments)    Unspecified   . Meclizine Other (See Comments)    Unspecified   . Sulfa Antibiotics Other (See Comments)    Unspecified      Objective: Vitals:   11/20/19 1635  Temp: (!) 96.8 F (25 C)    Pt 74 y.o. year old Caucasian female, obese in NAD. AAO x 3.   Vascular Examination:  Capillary refill time to digits immediate b/l. Palpable DP pulses b/l. Palpable PT pulses b/l. Pedal hair present b/l. Skin temperature gradient within normal limits b/l.  Dermatological Examination: Pedal skin with normal turgor, texture and tone bilaterally. No open wounds bilaterally. No interdigital macerations bilaterally. Toenails 1-5 b/l elongated, dystrophic, thickened, crumbly with subungual debris and tenderness to dorsal palpation. Procedure site noted to be completely healed with no erythema, no edema, no drainage, no purulence L hallux.  Musculoskeletal: Normal muscle strength 5/5 to all lower extremity muscle groups bilaterally, no pain crepitus or joint limitation noted with ROM b/l and bunion deformity noted b/l  Neurological: Protective sensation intact 5/5 intact bilaterally with 10g monofilament b/l Vibratory sensation intact b/l Proprioception intact bilaterally  Assessment: 1. Pain due to onychomycosis of toenails of both feet   2. Diabetes mellitus without complication ([facility])   3. Hallux valgus, acquired, bilateral   4. Encounter for diabetic foot exam  ([place])    Plan: -Diabetic foot examination performed on today's visit. -Continue diabetic foot care principles. Literature dispensed on today.  -Toenails 1-5 b/l were debrided in length and girth with sterile nail nippers and dremel without iatrogenic bleeding.  -Patient to continue soft, supportive shoe gear daily. -Patient to report any pedal injuries to medical professional immediately. -Patient/POA to call should there be question/concern in the interim.  Return in about 9 weeks (around 01/22/2020) for diabetic nail trim.

## 2020-01-14 ENCOUNTER — Other Ambulatory Visit: Payer: Self-pay

## 2020-01-14 ENCOUNTER — Ambulatory Visit: Payer: Medicare Other | Admitting: Orthotics

## 2020-01-14 DIAGNOSIS — E119 Type 2 diabetes mellitus without complications: Secondary | ICD-10-CM

## 2020-01-14 DIAGNOSIS — M2012 Hallux valgus (acquired), left foot: Secondary | ICD-10-CM

## 2020-01-14 DIAGNOSIS — M2011 Hallux valgus (acquired), right foot: Secondary | ICD-10-CM

## 2020-01-14 DIAGNOSIS — L03032 Cellulitis of left toe: Secondary | ICD-10-CM

## 2020-01-14 DIAGNOSIS — L02612 Cutaneous abscess of left foot: Secondary | ICD-10-CM

## 2020-01-14 NOTE — Progress Notes (Signed)

## 2020-01-16 DIAGNOSIS — L723 Sebaceous cyst: Secondary | ICD-10-CM | POA: Diagnosis not present

## 2020-01-16 DIAGNOSIS — L304 Erythema intertrigo: Secondary | ICD-10-CM | POA: Diagnosis not present

## 2020-01-16 DIAGNOSIS — L719 Rosacea, unspecified: Secondary | ICD-10-CM | POA: Diagnosis not present

## 2020-01-16 DIAGNOSIS — L578 Other skin changes due to chronic exposure to nonionizing radiation: Secondary | ICD-10-CM | POA: Diagnosis not present

## 2020-01-16 DIAGNOSIS — D2271 Melanocytic nevi of right lower limb, including hip: Secondary | ICD-10-CM | POA: Diagnosis not present

## 2020-01-16 DIAGNOSIS — L821 Other seborrheic keratosis: Secondary | ICD-10-CM | POA: Diagnosis not present

## 2020-01-30 DIAGNOSIS — M79641 Pain in right hand: Secondary | ICD-10-CM | POA: Diagnosis not present

## 2020-01-30 DIAGNOSIS — M1811 Unilateral primary osteoarthritis of first carpometacarpal joint, right hand: Secondary | ICD-10-CM | POA: Diagnosis not present

## 2020-01-30 DIAGNOSIS — M189 Osteoarthritis of first carpometacarpal joint, unspecified: Secondary | ICD-10-CM | POA: Diagnosis not present

## 2020-02-04 ENCOUNTER — Ambulatory Visit (INDEPENDENT_AMBULATORY_CARE_PROVIDER_SITE_OTHER): Payer: Medicare Other | Admitting: Podiatry

## 2020-02-04 ENCOUNTER — Other Ambulatory Visit: Payer: Self-pay

## 2020-02-04 ENCOUNTER — Encounter: Payer: Self-pay | Admitting: Podiatry

## 2020-02-04 DIAGNOSIS — B351 Tinea unguium: Secondary | ICD-10-CM

## 2020-02-04 DIAGNOSIS — M79675 Pain in left toe(s): Secondary | ICD-10-CM | POA: Diagnosis not present

## 2020-02-04 DIAGNOSIS — M79674 Pain in right toe(s): Secondary | ICD-10-CM

## 2020-02-04 DIAGNOSIS — M2011 Hallux valgus (acquired), right foot: Secondary | ICD-10-CM

## 2020-02-04 DIAGNOSIS — M2012 Hallux valgus (acquired), left foot: Secondary | ICD-10-CM

## 2020-02-04 DIAGNOSIS — E119 Type 2 diabetes mellitus without complications: Secondary | ICD-10-CM

## 2020-02-04 NOTE — Patient Instructions (Signed)
Diabetes Mellitus and Foot Care Foot care is an important part of your health, especially when you have diabetes. Diabetes may cause you to have problems because of poor blood flow (circulation) to your feet and legs, which can cause your skin to:  Become thinner and drier.  Break more easily.  Heal more slowly.  Peel and crack. You may also have nerve damage (neuropathy) in your legs and feet, causing decreased feeling in them. This means that you may not notice minor injuries to your feet that could lead to more serious problems. Noticing and addressing any potential problems early is the best way to prevent future foot problems. How to care for your feet Foot hygiene  Wash your feet daily with warm water and mild soap. Do not use hot water. Then, pat your feet and the areas between your toes until they are completely dry. Do not soak your feet as this can dry your skin.  Trim your toenails straight across. Do not dig under them or around the cuticle. File the edges of your nails with an emery board or nail file.  Apply a moisturizing lotion or petroleum jelly to the skin on your feet and to dry, brittle toenails. Use lotion that does not contain alcohol and is unscented. Do not apply lotion between your toes. Shoes and socks  Wear clean socks or stockings every day. Make sure they are not too tight. Do not wear knee-high stockings since they may decrease blood flow to your legs.  Wear shoes that fit properly and have enough cushioning. Always look in your shoes before you put them on to be sure there are no objects inside.  To break in new shoes, wear them for just a few hours a day. This prevents injuries on your feet. Wounds, scrapes, corns, and calluses  Check your feet daily for blisters, cuts, bruises, sores, and redness. If you cannot see the bottom of your feet, use a mirror or ask someone for help.  Do not cut corns or calluses or try to remove them with medicine.  If you  find a minor scrape, cut, or break in the skin on your feet, keep it and the skin around it clean and dry. You may clean these areas with mild soap and water. Do not clean the area with peroxide, alcohol, or iodine.  If you have a wound, scrape, corn, or callus on your foot, look at it several times a day to make sure it is healing and not infected. Check for: ? Redness, swelling, or pain. ? Fluid or blood. ? Warmth. ? Pus or a bad smell. General instructions  Do not cross your legs. This may decrease blood flow to your feet.  Do not use heating pads or hot water bottles on your feet. They may burn your skin. If you have lost feeling in your feet or legs, you may not know this is happening until it is too late.  Protect your feet from hot and cold by wearing shoes, such as at the beach or on hot pavement.  Schedule a complete foot exam at least once a year (annually) or more often if you have foot problems. If you have foot problems, report any cuts, sores, or bruises to your health care provider immediately. Contact a health care provider if:  You have a medical condition that increases your risk of infection and you have any cuts, sores, or bruises on your feet.  You have an injury that is not   healing.  You have redness on your legs or feet.  You feel burning or tingling in your legs or feet.  You have pain or cramps in your legs and feet.  Your legs or feet are numb.  Your feet always feel cold.  You have pain around a toenail. Get help right away if:  You have a wound, scrape, corn, or callus on your foot and: ? You have pain, swelling, or redness that gets worse. ? You have fluid or blood coming from the wound, scrape, corn, or callus. ? Your wound, scrape, corn, or callus feels warm to the touch. ? You have pus or a bad smell coming from the wound, scrape, corn, or callus. ? You have a fever. ? You have a red line going up your leg. Summary  Check your feet every day  for cuts, sores, red spots, swelling, and blisters.  Moisturize feet and legs daily.  Wear shoes that fit properly and have enough cushioning.  If you have foot problems, report any cuts, sores, or bruises to your health care provider immediately.  Schedule a complete foot exam at least once a year (annually) or more often if you have foot problems. This information is not intended to replace advice given to you by your health care provider. Make sure you discuss any questions you have with your health care provider. Document Revised: 05/23/2019 Document Reviewed: 10/01/2016 Elsevier Patient Education  2020 [employer]  

## 2020-02-11 NOTE — Progress Notes (Signed)
Subjective: Pamela [name] is a 74 y.o. female patient seen today preventative diabetic foot care and painful mycotic nails b/l that are difficult to trim. Pain interferes with ambulation. Aggravating factors include wearing enclosed shoe gear. Pain is relieved with periodic professional debridement.  She has been measured for her diabetic shoes.  Patient Active Problem List   Diagnosis Date Noted  . History of hypothyroidism 03/12/2015  . Obesity (BMI 30-39.9) 04/07/2013  . Hypertension 04/07/2013  . DM2 (diabetes mellitus, type 2) ([place]) 04/07/2013  . Hyperlipidemia with target LDL less than 70 04/07/2013    Current Outpatient Medications on File Prior to Visit  Medication Sig Dispense Refill  . amoxicillin (AMOXIL) 500 MG capsule Take 1 capsule (500 mg total) by mouth 2 (two) times daily. (Patient not taking: Reported on 11/09/2019) 20 capsule 0  . aspirin 81 MG tablet Take 162 mg by mouth daily.     . carvedilol (COREG) 6.25 MG tablet TAKE ONE AND ONE-HALF TABLETS TWICE A DAY WITH MEALS (PLEASE SCHEDULE AN APPOINTMENT FOR FURTHER REFILLS) 270 tablet 3  . ciprofloxacin (CIPRO) 500 MG tablet Take 1 tablet (500 mg total) by mouth 2 (two) times daily. (Patient not taking: Reported on 11/09/2019) 14 tablet 0  . Coenzyme Q10 (CO Q 10) 100 MG CAPS Take 200 mg by mouth daily.    . empagliflozin (JARDIANCE) 10 MG TABS tablet Take 5 mg by mouth daily. 5mg  in the morning     . ezetimibe (ZETIA) 10 MG tablet Take 1 tablet (10 mg total) by mouth daily. 30 tablet 2  . famotidine (PEPCID) 20 MG tablet Take 20 mg by mouth at bedtime.    Marland Kitchen glimepiride (AMARYL) 2 MG tablet Take 2 mg by mouth daily before breakfast.    . hydrochlorothiazide (HYDRODIURIL) 25 MG tablet Take 1 tablet (25 mg total) by mouth daily. 90 tablet 3  . levothyroxine (SYNTHROID, LEVOTHROID) 137 MCG tablet Take 137 mcg by mouth daily before breakfast. One daily, 1 and 1/2 on Sunday    . Linagliptin-Metformin HCl ER (JENTADUETO XR)  2.01-999 MG TB24 Take 2 tablets by mouth every morning.     Marland Kitchen lisinopril (PRINIVIL,ZESTRIL) 40 MG tablet Take 40 mg by mouth daily.    . metroNIDAZOLE (METROCREAM) 0.75 % cream      No current facility-administered medications on file prior to visit.    Allergies  Allergen Reactions  . Demerol [Meperidine] Other (See Comments)    Unspecified   . Macrobid [Nitrofurantoin Macrocrystal] Other (See Comments)    Unspecified   . Meclizine Other (See Comments)    Unspecified   . Sulfa Antibiotics Other (See Comments)    Unspecified     Objective: Physical Exam  General: Pamela [name] is a pleasant 74 y.o.  Caucasian female, in NAD. AAO x 3.  Vascular:  Neurovascular status unchanged b/l. Capillary refill time to digits immediate b/l. Palpable DP pulses b/l. Palpable PT pulses b/l. Pedal hair present b/l. Skin temperature gradient within normal limits b/l. No edema noted b/l.  Dermatological:  Pedal skin with normal turgor, texture and tone bilaterally. No open wounds bilaterally. No interdigital macerations bilaterally. Toenails 1-5 b/l elongated, discolored, dystrophic, thickened, crumbly with subungual debris and tenderness to dorsal palpation.  Musculoskeletal:  Normal muscle strength 5/5 to all lower extremity muscle groups bilaterally. No pain crepitus or joint limitation noted with ROM b/l. Hallux valgus with bunion deformity noted b/l.  Neurological:  Protective sensation intact 5/5 intact bilaterally with 10g monofilament b/l.  Vibratory sensation intact b/l. Proprioception intact bilaterally.  Assessment and Plan:  1. Pain due to onychomycosis of toenails of both feet   2. Hallux valgus, acquired, bilateral   3. Diabetes mellitus without complication ([place])    -Examined patient. -No new findings. No new orders. -Continue diabetic foot care principles. Literature dispensed on today.  -Toenails 1-5 b/l were debrided in length and girth with sterile nail nippers and  dremel without iatrogenic bleeding.  -Patient to continue soft, supportive shoe gear daily. -Patient to report any pedal injuries to medical professional immediately. -Patient/POA to call should there be question/concern in the interim.  Return in about 10 weeks (around 04/14/2020) for diabetic nail trim.  Marzetta Board, DPM

## 2020-02-22 ENCOUNTER — Other Ambulatory Visit: Payer: Medicare Other | Admitting: Orthotics

## 2020-02-27 DIAGNOSIS — E1149 Type 2 diabetes mellitus with other diabetic neurological complication: Secondary | ICD-10-CM | POA: Diagnosis not present

## 2020-02-27 DIAGNOSIS — D126 Benign neoplasm of colon, unspecified: Secondary | ICD-10-CM | POA: Diagnosis not present

## 2020-02-27 DIAGNOSIS — E1142 Type 2 diabetes mellitus with diabetic polyneuropathy: Secondary | ICD-10-CM | POA: Diagnosis not present

## 2020-02-27 DIAGNOSIS — I5189 Other ill-defined heart diseases: Secondary | ICD-10-CM | POA: Diagnosis not present

## 2020-02-27 DIAGNOSIS — E559 Vitamin D deficiency, unspecified: Secondary | ICD-10-CM | POA: Diagnosis not present

## 2020-02-27 DIAGNOSIS — E038 Other specified hypothyroidism: Secondary | ICD-10-CM | POA: Diagnosis not present

## 2020-02-27 DIAGNOSIS — I10 Essential (primary) hypertension: Secondary | ICD-10-CM | POA: Diagnosis not present

## 2020-02-27 DIAGNOSIS — F411 Generalized anxiety disorder: Secondary | ICD-10-CM | POA: Diagnosis not present

## 2020-02-27 DIAGNOSIS — E669 Obesity, unspecified: Secondary | ICD-10-CM | POA: Diagnosis not present

## 2020-02-27 DIAGNOSIS — R269 Unspecified abnormalities of gait and mobility: Secondary | ICD-10-CM | POA: Diagnosis not present

## 2020-02-27 DIAGNOSIS — E7849 Other hyperlipidemia: Secondary | ICD-10-CM | POA: Diagnosis not present

## 2020-03-05 ENCOUNTER — Ambulatory Visit: Payer: Medicare Other | Admitting: Orthotics

## 2020-03-05 ENCOUNTER — Other Ambulatory Visit: Payer: Self-pay

## 2020-03-05 DIAGNOSIS — E1149 Type 2 diabetes mellitus with other diabetic neurological complication: Secondary | ICD-10-CM | POA: Diagnosis not present

## 2020-03-05 DIAGNOSIS — E119 Type 2 diabetes mellitus without complications: Secondary | ICD-10-CM

## 2020-03-05 DIAGNOSIS — M2011 Hallux valgus (acquired), right foot: Secondary | ICD-10-CM

## 2020-03-05 NOTE — Progress Notes (Signed)
Wrong color shoe came in [facility]);' reordered NB [id number] in [place].

## 2020-03-11 ENCOUNTER — Telehealth: Payer: Self-pay | Admitting: Cardiovascular Disease

## 2020-03-11 NOTE — Telephone Encounter (Signed)
Pt c/o medication issue:  1. Name of Medication: ezetimibe (ZETIA) 10 MG tablet  2. How are you currently taking this medication (dosage and times per day)? Pt is not taking this medication   3. Are you having a reaction (difficulty breathing--STAT)? no  4. What is your medication issue? Pt said she is not taking this medication because she had a reaction to it in the past. She wanted to know if the lab orders to check her cholesterol would be a result of her taking this medication.   She also wanted to know how long the lab orders will be good for. She was not able to get in until November and wants to make sure her lab orders will still be good

## 2020-03-11 NOTE — Telephone Encounter (Signed)
If patient did not start Zetia, no need for laboratory presently.  However would consider starting bempedoic acid 180 mg daily and obtain follow-up laboratory 3 months after initiation

## 2020-03-11 NOTE — Telephone Encounter (Signed)
Patient states that she never started on Zetia that Dr. Claiborne Billings recommended back in 10/2019. She states that she has tried zetia before and did not tolerate it. She was supposed to be getting lab work around this time after starting zetia to see if her LDL had improved. Advised patient that I would let Dr. Claiborne Billings know that she is not taking zetia and see if he has any other recommendations.

## 2020-03-13 MED ORDER — BEMPEDOIC ACID 180 MG PO TABS: 180.0000 mg | ORAL_TABLET | Freq: Every day | ORAL | 3 refills | Status: DC

## 2020-03-13 NOTE — Telephone Encounter (Signed)
Spoke with the patient and she agrees to try out bempedoic acid 180 mg daily. She will let us know if she is not able to tolerate medication. She will plan to have lab work done in 3 months.

## 2020-03-25 DIAGNOSIS — M545 Low back pain: Secondary | ICD-10-CM | POA: Diagnosis not present

## 2020-04-02 ENCOUNTER — Ambulatory Visit: Payer: Medicare Other | Admitting: Orthotics

## 2020-04-02 ENCOUNTER — Other Ambulatory Visit: Payer: Self-pay

## 2020-04-02 DIAGNOSIS — M2011 Hallux valgus (acquired), right foot: Secondary | ICD-10-CM

## 2020-04-02 DIAGNOSIS — M2012 Hallux valgus (acquired), left foot: Secondary | ICD-10-CM

## 2020-04-02 DIAGNOSIS — E119 Type 2 diabetes mellitus without complications: Secondary | ICD-10-CM

## 2020-04-08 DIAGNOSIS — M4319 Spondylolisthesis, multiple sites in spine: Secondary | ICD-10-CM | POA: Diagnosis not present

## 2020-04-08 DIAGNOSIS — M47817 Spondylosis without myelopathy or radiculopathy, lumbosacral region: Secondary | ICD-10-CM | POA: Diagnosis not present

## 2020-04-08 DIAGNOSIS — M5136 Other intervertebral disc degeneration, lumbar region: Secondary | ICD-10-CM | POA: Diagnosis not present

## 2020-04-08 DIAGNOSIS — M47816 Spondylosis without myelopathy or radiculopathy, lumbar region: Secondary | ICD-10-CM | POA: Diagnosis not present

## 2020-04-08 DIAGNOSIS — M5137 Other intervertebral disc degeneration, lumbosacral region: Secondary | ICD-10-CM | POA: Diagnosis not present

## 2020-04-08 DIAGNOSIS — M5135 Other intervertebral disc degeneration, thoracolumbar region: Secondary | ICD-10-CM | POA: Diagnosis not present

## 2020-04-15 ENCOUNTER — Ambulatory Visit (INDEPENDENT_AMBULATORY_CARE_PROVIDER_SITE_OTHER): Payer: Medicare Other | Admitting: Ophthalmology

## 2020-04-15 ENCOUNTER — Encounter (INDEPENDENT_AMBULATORY_CARE_PROVIDER_SITE_OTHER): Payer: Self-pay | Admitting: Ophthalmology

## 2020-04-15 ENCOUNTER — Other Ambulatory Visit: Payer: Self-pay

## 2020-04-15 DIAGNOSIS — H2511 Age-related nuclear cataract, right eye: Secondary | ICD-10-CM | POA: Diagnosis not present

## 2020-04-15 DIAGNOSIS — H43821 Vitreomacular adhesion, right eye: Secondary | ICD-10-CM

## 2020-04-15 DIAGNOSIS — H43822 Vitreomacular adhesion, left eye: Secondary | ICD-10-CM

## 2020-04-15 DIAGNOSIS — E119 Type 2 diabetes mellitus without complications: Secondary | ICD-10-CM

## 2020-04-15 DIAGNOSIS — H2512 Age-related nuclear cataract, left eye: Secondary | ICD-10-CM | POA: Diagnosis not present

## 2020-04-15 HISTORY — DX: Vitreomacular adhesion, right eye: H43.821

## 2020-04-15 HISTORY — DX: Age-related nuclear cataract, right eye: H25.11

## 2020-04-15 NOTE — Assessment & Plan Note (Signed)

## 2020-04-15 NOTE — Assessment & Plan Note (Signed)

## 2020-04-15 NOTE — Progress Notes (Signed)
04/15/2020     CHIEF COMPLAINT Patient presents for Retina Follow Up   HISTORY OF PRESENT ILLNESS: Pamela [name] is a 74 y.o. female who presents to the clinic today for:   HPI    Retina Follow Up    Patient presents with  Diabetic Retinopathy.  In both eyes.  This started 1 year ago.  Severity is mild.  Duration of 1 year.  Since onset it is stable.          Comments    1 Year Diabetic F/U OU  Pt denies noticeable changes to [place] OU since last visit. Pt denies ocular pain, flashes of light, or floaters OU.  A1c: 6.7, 02/2020 LBS: 130 "something" checked yesterday       Last edited by Rockie Neighbours, [place] on 04/15/2020  2:08 PM. (History)      Referring physician: Reynold Bowen, MD [place],  [place] [address]  HISTORICAL INFORMATION:   Selected notes from the [facility]    Lab Results  Component Value Date   HGBA1C (H) 05/31/2009    7.7 (NOTE) The ADA recommends the following therapeutic goal for glycemic control related to Hgb A1c measurement: Goal of therapy: <6.5 Hgb A1c  Reference: American Diabetes Association: Clinical Practice Recommendations 2010, Diabetes Care, 2010, 33: (Suppl  1).     CURRENT MEDICATIONS: No current outpatient medications on file. (Ophthalmic Drugs)   No current facility-administered medications for this visit. (Ophthalmic Drugs)   Current Outpatient Medications (Other)  Medication Sig  . amoxicillin (AMOXIL) 500 MG capsule Take 1 capsule (500 mg total) by mouth 2 (two) times daily. (Patient not taking: Reported on 11/09/2019)  . aspirin 81 MG tablet Take 162 mg by mouth daily.   . Bempedoic Acid 180 MG TABS Take 180 mg by mouth daily.  . carvedilol (COREG) 6.25 MG tablet TAKE ONE AND ONE-HALF TABLETS TWICE A DAY WITH MEALS (PLEASE SCHEDULE AN APPOINTMENT FOR FURTHER REFILLS)  . ciprofloxacin (CIPRO) 500 MG tablet Take 1 tablet (500 mg total) by mouth 2 (two) times daily. (Patient not taking: Reported on  11/09/2019)  . Coenzyme Q10 (CO Q 10) 100 MG CAPS Take 200 mg by mouth daily.  . empagliflozin (JARDIANCE) 10 MG TABS tablet Take 5 mg by mouth daily. 5mg  in the morning   . ezetimibe (ZETIA) 10 MG tablet Take 1 tablet (10 mg total) by mouth daily.  . famotidine (PEPCID) 20 MG tablet Take 20 mg by mouth at bedtime.  Marland Kitchen glimepiride (AMARYL) 2 MG tablet Take 2 mg by mouth daily before breakfast.  . hydrochlorothiazide (HYDRODIURIL) 25 MG tablet Take 1 tablet (25 mg total) by mouth daily.  Marland Kitchen levothyroxine (SYNTHROID, LEVOTHROID) 137 MCG tablet Take 137 mcg by mouth daily before breakfast. One daily, 1 and 1/2 on Sunday  . Linagliptin-Metformin HCl ER (JENTADUETO XR) 2.01-999 MG TB24 Take 2 tablets by mouth every morning.   Marland Kitchen lisinopril (PRINIVIL,ZESTRIL) 40 MG tablet Take 40 mg by mouth daily.  . metroNIDAZOLE (METROCREAM) 0.75 % cream    No current facility-administered medications for this visit. (Other)      REVIEW OF SYSTEMS:    ALLERGIES Allergies  Allergen Reactions  . Demerol [Meperidine] Other (See Comments)    Unspecified   . Macrobid [Nitrofurantoin Macrocrystal] Other (See Comments)    Unspecified   . Meclizine Other (See Comments)    Unspecified   . Sulfa Antibiotics Other (See Comments)    Unspecified  PAST MEDICAL HISTORY Past Medical History:  Diagnosis Date  . Diabetes mellitus ([place])   . GERD (gastroesophageal reflux disease)   . Heart murmur   . Hyperlipidemia   . Hypertension   . Thyroid disease    hypo   Past Surgical History:  Procedure Laterality Date  . ABDOMINAL HYSTERECTOMY    . CHOLECYSTECTOMY    . COLONOSCOPY    . TONSILLECTOMY      FAMILY HISTORY Family History  Problem Relation Age of Onset  . Hypertension Mother        also cerebral hemorrhage  . Hypertension Father   . Diabetes Father        also [place]  . Cancer - Other Paternal Grandmother        breast cancer  . Heart Problems Sister        heart problems, pacer in  2011  . Hyperlipidemia Sister   . Heart failure Sister   . Hypertension Sister   . Colon cancer Neg Hx   . Esophageal cancer Neg Hx   . Stomach cancer Neg Hx   . Rectal cancer Neg Hx   . Breast cancer Neg Hx     SOCIAL HISTORY Social History   Tobacco Use  . Smoking status: Never Smoker  . Smokeless tobacco: Never Used  Vaping Use  . Vaping Use: Never used  Substance Use Topics  . Alcohol use: No  . Drug use: No         OPHTHALMIC EXAM:  Base Eye Exam    Visual Acuity (ETDRS)      Right Left   Dist [place] 20/40 +2 20/20 -1   Dist ph [place] NI        Tonometry (Tonopen, 2:13 PM)      Right Left   Pressure 13 10       Pupils      Pupils Dark Light Shape React APD   Right PERRL 4 3 Round Brisk None   Left PERRL 4 3 Round Brisk None       Visual Fields (Counting fingers)      Left Right    Full Full       Extraocular Movement      Right Left    Full Full       Neuro/Psych    Oriented x3: Yes   Mood/Affect: Normal       Dilation    Both eyes: 1.0% Mydriacyl, 2.5% Phenylephrine @ 2:13 PM        Slit Lamp and Fundus Exam    External Exam      Right Left   External Normal Normal       Slit Lamp Exam      Right Left   Lids/Lashes Normal Normal   Conjunctiva/Sclera White and quiet White and quiet   Cornea Clear Clear   Anterior Chamber Deep and quiet Deep and quiet   Iris Round and reactive Round and reactive   Lens 2+ Nuclear sclerosis 2+ Nuclear sclerosis   Anterior Vitreous Normal Normal       Fundus Exam      Right Left   Posterior Vitreous Posterior vitreous detachment           IMAGING AND PROCEDURES  Imaging and Procedures for 04/15/20  OCT, Retina - OU - Both Eyes       Right Eye Quality was good. Scan locations included subfoveal. Central Foveal Thickness: 287. Findings include vitreomacular adhesion , normal foveal  contour.   Left Eye Quality was good. Scan locations included subfoveal. Central Foveal Thickness: 299.  Findings include vitreomacular adhesion , normal foveal contour.                 ASSESSMENT/PLAN:  Diabetes mellitus without complication ([place]) The patient has diabetes without any evidence of retinopathy. The patient advised to maintain good blood glucose control, excellent blood pressure control, and favorable levels of cholesterol, low density lipoprotein, and high density lipoproteins. Follow up in 1 year was recommended. Explained that fluctuations in visual acuity , or "out of focus", may result from large variations of blood sugar control.  Age-related nuclear cataract of right eye The nature of cataract was discussed with the patient as well as the elective nature of surgery. The patient was reassured that surgery at a later date does not put the patient at risk for a worse outcome. It was emphasized that the need for surgery is dictated by the patient's quality of life as influenced by the cataract. Patient was instructed to maintain close follow up with their general eye care doctor.  Vitreomacular adhesion of right eye Vitreomacular traction may cause vision loss from anatomic distortion to the center of the vision, the macula.  If visual function is symptomatic or threatened, therapy may be needed.  Surgical intervention offers the highest chance of visual stability and improvement.  Distortion of the macula anatomy may cause splitting of the retinal layers, termed foveomacular retinoschisis, which can cause more permanent vision loss.  Epiretinal membranes may also be associated.  Macular hole may also develop if vitreomacular traction progresses. The minor form of this condition is Vitreomacular adhesion, which is a natural change in the aging process of the eye, which requires observation only.      ICD-10-CM   1. Vitreomacular adhesion of right eye  H43.821 OCT, Retina - OU - Both Eyes  2. Age-related nuclear cataract of right eye  H25.11   3. Age-related nuclear cataract of  left eye  H25.12   4. Vitreomacular adhesion of left eye  H43.822 OCT, Retina - OU - Both Eyes  5. Diabetes mellitus without complication (HCC)  B34.1     1.  2.  3.  Ophthalmic Meds Ordered this visit:  No orders of the defined types were placed in this encounter.      Return in about 1 year (around 04/15/2021) for DILATE OU, COLOR FP, OCT.  There are no Patient Instructions on file for this visit.   Explained the diagnoses, plan, and follow up with the patient and they expressed understanding.  Patient expressed understanding of the importance of proper follow up care.   Clent Demark Markeis Allman M.D. Diseases & Surgery of the Retina and Vitreous Retina & Diabetic [place] 04/15/20     Abbreviations: M myopia (nearsighted); A astigmatism; H hyperopia (farsighted); P presbyopia; Mrx spectacle prescription;  CTL contact lenses; OD right eye; OS left eye; OU both eyes  XT exotropia; ET esotropia; PEK punctate epithelial keratitis; PEE punctate epithelial erosions; DES dry eye syndrome; MGD meibomian gland dysfunction; ATs artificial tears; PFAT's preservative free artificial tears; [place] nuclear sclerotic cataract; PSC posterior subcapsular cataract; ERM epi-retinal membrane; PVD posterior vitreous detachment; RD retinal detachment; DM diabetes mellitus; DR diabetic retinopathy; NPDR non-proliferative diabetic retinopathy; PDR proliferative diabetic retinopathy; CSME clinically significant macular edema; DME diabetic macular edema; dbh dot blot hemorrhages; CWS cotton wool spot; POAG primary open angle glaucoma; C/D cup-to-disc ratio; HVF humphrey visual field; GVF goldmann visual  field; OCT optical coherence tomography; IOP intraocular pressure; BRVO Branch retinal vein occlusion; CRVO central retinal vein occlusion; CRAO central retinal artery occlusion; BRAO branch retinal artery occlusion; RT retinal tear; SB scleral buckle; PPV pars plana vitrectomy; VH Vitreous hemorrhage; PRP panretinal  laser photocoagulation; IVK intravitreal kenalog; VMT vitreomacular traction; MH Macular hole;  NVD neovascularization of the disc; NVE neovascularization elsewhere; AREDS age related eye disease study; ARMD age related macular degeneration; POAG primary open angle glaucoma; EBMD epithelial/anterior basement membrane dystrophy; ACIOL anterior chamber intraocular lens; IOL intraocular lens; PCIOL posterior chamber intraocular lens; Phaco/IOL phacoemulsification with intraocular lens placement; [place] photorefractive keratectomy; LASIK laser assisted in situ keratomileusis; HTN hypertension; DM diabetes mellitus; COPD chronic obstructive pulmonary disease

## 2020-04-15 NOTE — Assessment & Plan Note (Signed)
Vitreomacular traction may cause vision loss from anatomic distortion to the center of the vision, the macula.  If visual function is symptomatic or threatened, therapy may be needed.  Surgical intervention offers the highest chance of visual stability and improvement.  Distortion of the macula anatomy may cause splitting of the retinal layers, termed foveomacular retinoschisis, which can cause more permanent vision loss.  Epiretinal membranes may also be associated.  Macular hole may also develop if vitreomacular traction progresses. The minor form of this condition is Vitreomacular adhesion, which is a natural change in the aging process of the eye, which requires observation only. 

## 2020-04-18 ENCOUNTER — Ambulatory Visit (INDEPENDENT_AMBULATORY_CARE_PROVIDER_SITE_OTHER): Payer: Medicare Other | Admitting: Podiatry

## 2020-04-18 ENCOUNTER — Other Ambulatory Visit: Payer: Self-pay

## 2020-04-18 ENCOUNTER — Encounter: Payer: Self-pay | Admitting: Podiatry

## 2020-04-18 DIAGNOSIS — B351 Tinea unguium: Secondary | ICD-10-CM | POA: Diagnosis not present

## 2020-04-18 DIAGNOSIS — M79675 Pain in left toe(s): Secondary | ICD-10-CM

## 2020-04-18 DIAGNOSIS — M79674 Pain in right toe(s): Secondary | ICD-10-CM

## 2020-04-19 NOTE — Progress Notes (Signed)
Subjective:  Patient ID: Pamela [name], female    DOB: Mar 03, 1946,  MRN: [id number]  74 y.o. female presents with preventative diabetic foot care and painful thick toenails that are difficult to trim. Pain interferes with ambulation. Aggravating factors include wearing enclosed shoe gear. Pain is relieved with periodic professional debridement..    Review of Systems: Negative except as noted in the HPI.  Past Medical History:  Diagnosis Date  . Diabetes mellitus ([place])   . GERD (gastroesophageal reflux disease)   . Heart murmur   . Hyperlipidemia   . Hypertension   . Thyroid disease    hypo   Past Surgical History:  Procedure Laterality Date  . ABDOMINAL HYSTERECTOMY    . CHOLECYSTECTOMY    . COLONOSCOPY    . TONSILLECTOMY     Patient Active Problem List   Diagnosis Date Noted  . Age-related nuclear cataract of right eye 04/15/2020  . Age-related nuclear cataract of left eye 04/15/2020  . Vitreomacular adhesion of left eye 04/15/2020  . Vitreomacular adhesion of right eye 04/15/2020  . History of hypothyroidism 03/12/2015  . Obesity (BMI 30-39.9) 04/07/2013  . Hypertension 04/07/2013  . Diabetes mellitus without complication ([place]) 25/00/3704  . Hyperlipidemia with target LDL less than 70 04/07/2013    Current Outpatient Medications:  .  amoxicillin (AMOXIL) 500 MG capsule, Take 1 capsule (500 mg total) by mouth 2 (two) times daily. (Patient not taking: Reported on 11/09/2019), Disp: 20 capsule, Rfl: 0 .  aspirin 81 MG tablet, Take 162 mg by mouth daily. , Disp: , Rfl:  .  Bempedoic Acid 180 MG TABS, Take 180 mg by mouth daily., Disp: 90 tablet, Rfl: 3 .  carvedilol (COREG) 6.25 MG tablet, TAKE ONE AND ONE-HALF TABLETS TWICE A DAY WITH MEALS (PLEASE SCHEDULE AN APPOINTMENT FOR FURTHER REFILLS), Disp: 270 tablet, Rfl: 3 .  ciprofloxacin (CIPRO) 500 MG tablet, Take 1 tablet (500 mg total) by mouth 2 (two) times daily. (Patient not taking: Reported on 11/09/2019), Disp: 14 tablet,  Rfl: 0 .  Coenzyme Q10 (CO Q 10) 100 MG CAPS, Take 200 mg by mouth daily., Disp: , Rfl:  .  empagliflozin (JARDIANCE) 10 MG TABS tablet, Take 5 mg by mouth daily. 5mg  in the morning , Disp: , Rfl:  .  ezetimibe (ZETIA) 10 MG tablet, Take 1 tablet (10 mg total) by mouth daily., Disp: 30 tablet, Rfl: 2 .  famotidine (PEPCID) 20 MG tablet, Take 20 mg by mouth at bedtime., Disp: , Rfl:  .  glimepiride (AMARYL) 2 MG tablet, Take 2 mg by mouth daily before breakfast., Disp: , Rfl:  .  hydrochlorothiazide (HYDRODIURIL) 25 MG tablet, Take 1 tablet (25 mg total) by mouth daily., Disp: 90 tablet, Rfl: 3 .  levothyroxine (SYNTHROID, LEVOTHROID) 137 MCG tablet, Take 137 mcg by mouth daily before breakfast. One daily, 1 and 1/2 on Sunday, Disp: , Rfl:  .  Linagliptin-Metformin HCl ER (JENTADUETO XR) 2.01-999 MG TB24, Take 2 tablets by mouth every morning. , Disp: , Rfl:  .  lisinopril (PRINIVIL,ZESTRIL) 40 MG tablet, Take 40 mg by mouth daily., Disp: , Rfl:  .  metroNIDAZOLE (METROCREAM) 0.75 % cream, , Disp: , Rfl:  Allergies  Allergen Reactions  . Demerol [Meperidine] Other (See Comments)    Unspecified   . Macrobid [Nitrofurantoin Macrocrystal] Other (See Comments)    Unspecified   . Meclizine Other (See Comments)    Unspecified   . Sulfa Antibiotics Other (See Comments)    Unspecified  Social History   Tobacco Use  Smoking Status Never Smoker  Smokeless Tobacco Never Used   Objective:  There were no vitals filed for this visit. Constitutional Patient is a pleasant 75 y.o. Caucasian female in NAD.Marland Kitchen AAO x 3.  Vascular Capillary refill time to digits immediate b/l. Palpable pedal pulses b/l LE. Pedal hair present. Lower extremity skin temperature gradient within normal limits. No cyanosis or clubbing noted.  Neurologic Normal speech. Oriented to person, place, and time. Protective sensation intact 5/5 intact bilaterally with 10g monofilament b/l. Vibratory sensation intact b/l.  Proprioception intact bilaterally.  Dermatologic Pedal skin with normal turgor, texture and tone bilaterally. No open wounds bilaterally. No interdigital macerations bilaterally. Toenails 1-5 b/l elongated, discolored, dystrophic, thickened, crumbly with subungual debris and tenderness to dorsal palpation. Incurvated nailplate medial border(s) L hallux.  Nail border hypertrophy absent. Sign(s) of infection: no clinical signs of infection noted on examination today..  Orthopedic: Normal muscle strength 5/5 to all lower extremity muscle groups bilaterally. No pain crepitus or joint limitation noted with ROM b/l. Hallux valgus with bunion deformity noted b/l lower extremities.   Assessment:   1. Pain due to onychomycosis of toenails of both feet    Plan:  Patient was evaluated and treated and all questions answered.  Onychomycosis with pain -Nails palliatively debridement as below. -Educated on self-care  Procedure: Nail Debridement Rationale: Pain Type of Debridement: manual, sharp debridement. Instrumentation: Nail nipper, rotary burr. Number of Nails: 10  -Examined patient. -No new findings. No new orders. -Toenails 1-5 b/l were debrided in length and girth with sterile nail nippers and dremel without iatrogenic bleeding.  -Offending nail border debrided and curretaged L hallux utilizing sterile nail nipper and currette. Border cleansed with alcohol and triple antibiotic applied. No further treatment required by patient/caregiver. -Patient to report any pedal injuries to medical professional immediately. -Patient to continue soft, supportive shoe gear daily. -Patient/POA to call should there be question/concern in the interim.  Return in about 9 weeks (around 06/20/2020) for diabetic nail trim.  Marzetta Board, DPM

## 2020-04-23 DIAGNOSIS — M545 Low back pain: Secondary | ICD-10-CM | POA: Diagnosis not present

## 2020-04-23 DIAGNOSIS — M419 Scoliosis, unspecified: Secondary | ICD-10-CM | POA: Diagnosis not present

## 2020-04-23 DIAGNOSIS — M4310 Spondylolisthesis, site unspecified: Secondary | ICD-10-CM | POA: Diagnosis not present

## 2020-04-23 DIAGNOSIS — M5136 Other intervertebral disc degeneration, lumbar region: Secondary | ICD-10-CM | POA: Diagnosis not present

## 2020-04-23 DIAGNOSIS — M48061 Spinal stenosis, lumbar region without neurogenic claudication: Secondary | ICD-10-CM | POA: Diagnosis not present

## 2020-04-30 ENCOUNTER — Telehealth: Payer: Self-pay | Admitting: Podiatry

## 2020-04-30 NOTE — Telephone Encounter (Signed)
Pt left message yesterday stating the diabetic shoes she got seem to work fine but the slides she got may need some adjustment.  I returned call and left message for pt to call me back and I can get her scheduled to see Liliane Channel.

## 2020-05-08 ENCOUNTER — Ambulatory Visit: Payer: Medicare Other | Admitting: Orthotics

## 2020-05-08 ENCOUNTER — Other Ambulatory Visit: Payer: Self-pay

## 2020-05-08 DIAGNOSIS — M2011 Hallux valgus (acquired), right foot: Secondary | ICD-10-CM

## 2020-05-08 DIAGNOSIS — M79674 Pain in right toe(s): Secondary | ICD-10-CM

## 2020-05-08 DIAGNOSIS — B351 Tinea unguium: Secondary | ICD-10-CM

## 2020-05-08 NOTE — Progress Notes (Signed)
Took material out of arch to soften the arch; she is happy

## 2020-05-13 DIAGNOSIS — M47896 Other spondylosis, lumbar region: Secondary | ICD-10-CM | POA: Diagnosis not present

## 2020-05-13 DIAGNOSIS — M47816 Spondylosis without myelopathy or radiculopathy, lumbar region: Secondary | ICD-10-CM | POA: Diagnosis not present

## 2020-05-27 DIAGNOSIS — M5416 Radiculopathy, lumbar region: Secondary | ICD-10-CM | POA: Diagnosis not present

## 2020-05-27 DIAGNOSIS — M47896 Other spondylosis, lumbar region: Secondary | ICD-10-CM | POA: Diagnosis not present

## 2020-05-27 DIAGNOSIS — M47816 Spondylosis without myelopathy or radiculopathy, lumbar region: Secondary | ICD-10-CM | POA: Diagnosis not present

## 2020-06-19 ENCOUNTER — Other Ambulatory Visit: Payer: Self-pay | Admitting: Obstetrics & Gynecology

## 2020-06-19 DIAGNOSIS — Z1231 Encounter for screening mammogram for malignant neoplasm of breast: Secondary | ICD-10-CM

## 2020-06-26 DIAGNOSIS — M5136 Other intervertebral disc degeneration, lumbar region: Secondary | ICD-10-CM | POA: Diagnosis not present

## 2020-06-30 DIAGNOSIS — H35033 Hypertensive retinopathy, bilateral: Secondary | ICD-10-CM | POA: Diagnosis not present

## 2020-06-30 DIAGNOSIS — H2513 Age-related nuclear cataract, bilateral: Secondary | ICD-10-CM | POA: Diagnosis not present

## 2020-06-30 DIAGNOSIS — E119 Type 2 diabetes mellitus without complications: Secondary | ICD-10-CM | POA: Diagnosis not present

## 2020-06-30 DIAGNOSIS — H25013 Cortical age-related cataract, bilateral: Secondary | ICD-10-CM | POA: Diagnosis not present

## 2020-07-02 ENCOUNTER — Other Ambulatory Visit: Payer: Self-pay

## 2020-07-02 ENCOUNTER — Ambulatory Visit (INDEPENDENT_AMBULATORY_CARE_PROVIDER_SITE_OTHER): Payer: Medicare Other | Admitting: Podiatry

## 2020-07-02 ENCOUNTER — Encounter: Payer: Self-pay | Admitting: Podiatry

## 2020-07-02 DIAGNOSIS — M79674 Pain in right toe(s): Secondary | ICD-10-CM

## 2020-07-02 DIAGNOSIS — B351 Tinea unguium: Secondary | ICD-10-CM

## 2020-07-02 DIAGNOSIS — M79675 Pain in left toe(s): Secondary | ICD-10-CM | POA: Diagnosis not present

## 2020-07-02 NOTE — Progress Notes (Signed)
Subjective:  Patient ID: Pamela [name], female    DOB: 1945/10/12,  MRN: [id number]  74 y.o. female presents with preventative diabetic foot care and painful thick toenails that are difficult to trim. Pain interferes with ambulation. Aggravating factors include wearing enclosed shoe gear. Pain is relieved with periodic professional debridement.   She states her left great toe feels better, but she feels it's a little "tight" on the side. Denies any redness, drainage, swelling or frank pain.  Review of Systems: Negative except as noted in the HPI.  Past Medical History:  Diagnosis Date  . Diabetes mellitus ([place])   . GERD (gastroesophageal reflux disease)   . Heart murmur   . Hyperlipidemia   . Hypertension   . Thyroid disease    hypo   Past Surgical History:  Procedure Laterality Date  . ABDOMINAL HYSTERECTOMY    . CHOLECYSTECTOMY    . COLONOSCOPY    . TONSILLECTOMY     Patient Active Problem List   Diagnosis Date Noted  . Age-related nuclear cataract of right eye 04/15/2020  . Age-related nuclear cataract of left eye 04/15/2020  . Vitreomacular adhesion of left eye 04/15/2020  . Vitreomacular adhesion of right eye 04/15/2020  . History of hypothyroidism 03/12/2015  . Obesity (BMI 30-39.9) 04/07/2013  . Hypertension 04/07/2013  . Diabetes mellitus without complication ([place]) 09/21/3233  . Hyperlipidemia with target LDL less than 70 04/07/2013    Current Outpatient Medications:  .  amoxicillin (AMOXIL) 500 MG capsule, Take 1 capsule (500 mg total) by mouth 2 (two) times daily. (Patient not taking: Reported on 11/09/2019), Disp: 20 capsule, Rfl: 0 .  aspirin 81 MG tablet, Take 162 mg by mouth daily. , Disp: , Rfl:  .  Bempedoic Acid 180 MG TABS, Take 180 mg by mouth daily., Disp: 90 tablet, Rfl: 3 .  carvedilol (COREG) 6.25 MG tablet, TAKE ONE AND ONE-HALF TABLETS TWICE A DAY WITH MEALS (PLEASE SCHEDULE AN APPOINTMENT FOR FURTHER REFILLS), Disp: 270 tablet, Rfl: 3 .   ciprofloxacin (CIPRO) 500 MG tablet, Take 1 tablet (500 mg total) by mouth 2 (two) times daily. (Patient not taking: Reported on 11/09/2019), Disp: 14 tablet, Rfl: 0 .  Coenzyme Q10 (CO Q 10) 100 MG CAPS, Take 200 mg by mouth daily., Disp: , Rfl:  .  empagliflozin (JARDIANCE) 10 MG TABS tablet, Take 5 mg by mouth daily. 5mg  in the morning , Disp: , Rfl:  .  ezetimibe (ZETIA) 10 MG tablet, Take 1 tablet (10 mg total) by mouth daily., Disp: 30 tablet, Rfl: 2 .  famotidine (PEPCID) 20 MG tablet, Take 20 mg by mouth at bedtime., Disp: , Rfl:  .  glimepiride (AMARYL) 2 MG tablet, Take 2 mg by mouth daily before breakfast., Disp: , Rfl:  .  hydrochlorothiazide (HYDRODIURIL) 25 MG tablet, Take 1 tablet (25 mg total) by mouth daily., Disp: 90 tablet, Rfl: 3 .  levothyroxine (SYNTHROID, LEVOTHROID) 137 MCG tablet, Take 137 mcg by mouth daily before breakfast. One daily, 1 and 1/2 on Sunday, Disp: , Rfl:  .  Linagliptin-Metformin HCl ER (JENTADUETO XR) 2.01-999 MG TB24, Take 2 tablets by mouth every morning. , Disp: , Rfl:  .  lisinopril (PRINIVIL,ZESTRIL) 40 MG tablet, Take 40 mg by mouth daily., Disp: , Rfl:  .  metroNIDAZOLE (METROCREAM) 0.75 % cream, , Disp: , Rfl:  Allergies  Allergen Reactions  . Demerol [Meperidine] Other (See Comments)    Unspecified   . Macrobid [Nitrofurantoin Macrocrystal] Other (See Comments)  Unspecified   . Meclizine Other (See Comments)    Unspecified   . Sulfa Antibiotics Other (See Comments)    Unspecified    Social History   Tobacco Use  Smoking Status Never Smoker  Smokeless Tobacco Never Used   Objective:  There were no vitals filed for this visit. Constitutional Pamela [name] is a pleasant 74 y.o. Caucasian female in NAD. AAO x 3.  Vascular Capillary refill time to digits immediate b/l. Palpable pedal pulses b/l LE. Pedal hair present. Lower extremity skin temperature gradient within normal limits. No cyanosis or clubbing noted.  Neurologic Normal speech.  Oriented to person, place, and time. Protective sensation intact 5/5 intact bilaterally with 10g monofilament b/l. Vibratory sensation intact b/l. Proprioception intact bilaterally.  Dermatologic Pedal skin with normal turgor, texture and tone bilaterally. No open wounds bilaterally. No interdigital macerations bilaterally. Toenails 1-5 b/l elongated, discolored, dystrophic, thickened, crumbly with subungual debris and tenderness to dorsal palpation. Incurvated nailplate medial border(s) L hallux.  Nail border hypertrophy absent. Sign(s) of infection: no clinical signs of infection noted on examination today..  Orthopedic: Normal muscle strength 5/5 to all lower extremity muscle groups bilaterally. No pain crepitus or joint limitation noted with ROM b/l. Hallux valgus with bunion deformity noted b/l lower extremities.   Assessment:   1. Pain due to onychomycosis of toenails of both feet    Plan:  Patient was evaluated and treated and all questions answered.  Onychomycosis with pain -Nails palliatively debridement as below. -Educated on self-care  Procedure: Nail Debridement Rationale: Pain Type of Debridement: manual, sharp debridement. Instrumentation: Nail nipper, rotary burr. Number of Nails: 10  -Examined patient. -No new findings. No new orders. -Toenails 1-5 b/l were debrided in length and girth with sterile nail nippers and dremel without iatrogenic bleeding.  -Offending nail border debrided and curretaged L hallux utilizing sterile nail nipper and currette. Border cleansed with alcohol and triple antibiotic applied. No further treatment required by patient/caregiver. -Patient to report any pedal injuries to medical professional immediately. -Patient to continue soft, supportive shoe gear daily. -Patient/POA to call should there be question/concern in the interim.  Return in about 9 weeks (around 09/03/2020).  Marzetta Board, DPM

## 2020-07-09 DIAGNOSIS — F411 Generalized anxiety disorder: Secondary | ICD-10-CM | POA: Diagnosis not present

## 2020-07-09 DIAGNOSIS — E039 Hypothyroidism, unspecified: Secondary | ICD-10-CM | POA: Diagnosis not present

## 2020-07-09 DIAGNOSIS — N1831 Chronic kidney disease, stage 3a: Secondary | ICD-10-CM | POA: Diagnosis not present

## 2020-07-09 DIAGNOSIS — E559 Vitamin D deficiency, unspecified: Secondary | ICD-10-CM | POA: Diagnosis not present

## 2020-07-09 DIAGNOSIS — R269 Unspecified abnormalities of gait and mobility: Secondary | ICD-10-CM | POA: Diagnosis not present

## 2020-07-09 DIAGNOSIS — H43821 Vitreomacular adhesion, right eye: Secondary | ICD-10-CM | POA: Diagnosis not present

## 2020-07-09 DIAGNOSIS — I10 Essential (primary) hypertension: Secondary | ICD-10-CM | POA: Diagnosis not present

## 2020-07-09 DIAGNOSIS — I129 Hypertensive chronic kidney disease with stage 1 through stage 4 chronic kidney disease, or unspecified chronic kidney disease: Secondary | ICD-10-CM | POA: Diagnosis not present

## 2020-07-09 DIAGNOSIS — E1140 Type 2 diabetes mellitus with diabetic neuropathy, unspecified: Secondary | ICD-10-CM | POA: Diagnosis not present

## 2020-07-09 DIAGNOSIS — E785 Hyperlipidemia, unspecified: Secondary | ICD-10-CM | POA: Diagnosis not present

## 2020-07-09 DIAGNOSIS — E669 Obesity, unspecified: Secondary | ICD-10-CM | POA: Diagnosis not present

## 2020-07-17 ENCOUNTER — Ambulatory Visit (INDEPENDENT_AMBULATORY_CARE_PROVIDER_SITE_OTHER): Payer: Medicare Other | Admitting: Cardiovascular Disease

## 2020-07-17 ENCOUNTER — Other Ambulatory Visit: Payer: Self-pay

## 2020-07-17 ENCOUNTER — Encounter: Payer: Self-pay | Admitting: Cardiovascular Disease

## 2020-07-17 VITALS — BP 148/76 | HR 62 | Ht 66.0 in | Wt 225.0 lb

## 2020-07-17 DIAGNOSIS — E668 Other obesity: Secondary | ICD-10-CM

## 2020-07-17 DIAGNOSIS — I10 Essential (primary) hypertension: Secondary | ICD-10-CM | POA: Diagnosis not present

## 2020-07-17 DIAGNOSIS — E119 Type 2 diabetes mellitus without complications: Secondary | ICD-10-CM

## 2020-07-17 DIAGNOSIS — E785 Hyperlipidemia, unspecified: Secondary | ICD-10-CM | POA: Diagnosis not present

## 2020-07-17 DIAGNOSIS — E039 Hypothyroidism, unspecified: Secondary | ICD-10-CM | POA: Diagnosis not present

## 2020-07-17 MED ORDER — EZETIMIBE 10 MG PO TABS: 10.0000 mg | ORAL_TABLET | Freq: Every day | ORAL | 2 refills | Status: DC

## 2020-07-17 NOTE — Patient Instructions (Addendum)
Medication Instructions:  START Zetia 10 mg (1 tablet) daily  *If you need a refill on your cardiac medications before your next appointment, please call your pharmacy*  Lab Work: [place]  If you have labs (blood work) drawn today and your tests are completely normal, you will receive your results only by: Marland Kitchen MyChart Message (if you have MyChart) OR . A paper copy in the mail If you have any lab test that is abnormal or we need to change your treatment, we will call you to review the results.  Follow-Up: At [facility], you and your health needs are our priority.  As part of our continuing mission to provide you with exceptional heart care, we have created designated Provider Care Teams.  These Care Teams include your primary Cardiologist (physician) and Advanced Practice Providers (APPs -  Physician Assistants and Nurse Practitioners) who all work together to provide you with the care you need, when you need it.  We recommend signing up for the patient portal called "MyChart".  Sign up information is provided on this After Visit Summary.  MyChart is used to connect with patients for Virtual Visits (Telemedicine).  Patients are able to view lab/test results, encounter notes, upcoming appointments, etc.  Non-urgent messages can be sent to your provider as well.   To learn more about what you can do with MyChart, go to [email or web address].    Your next appointment:   1 year(s)  The format for your next appointment:   In Person  Provider:   Shelva Majestic, MD

## 2020-07-17 NOTE — Progress Notes (Signed)
Patient ID: Pamela [name], female   DOB: 06/03/1946, 74 y.o.   MRN: [id number]    HPI: Pamela [name] is a 74 y.o. female who presents for a 9 month followup cardiology evaluation.  [name] has a long-standing history of obesity, hypertension, type 2 diabetes mellitus, and hyperlipidemia. A Lexiscan Myoview study in March 2014  done for evaluation of chest pain revealed normal perfusion without scar or ischemia. Post-rest ejection fraction was 74%.  She has a history of obesity and when I had seen her in 2013 she had lost approximaltey 20 pounds. She has a history of hyperlipidemia and had been started on lipid-lowering therapy when her NMR lipoprotein LDL particle number was 1834 and LDL of 110, small LDL particles of 1083 and triglycerides of 176. Initially she was taking Crestor 10 mg daily but developed myalgias and reduced the dose to every other day.  She is no longer taking statin therapy.  She is followed by Dr. Carrolyn Meiers.   In July 2017, she underwent a five-year follow-up echo Doppler study which showed an EF of 60-65%.  There was mild LVH and grade 1 diastolic dysfunction.  She had mildly thickened mitral valve leaflets with trace to mild MR.  I saw her in January 2019 she denied any  episodes of chest pain orshortness of breath.  She cannot tolerate statins admits to having myalgias with all statins.  She was on levothyroxine for hypothyroidism, carvedilol 6.25 mg twice a day in addition to lisinopril 40 mg daily and HCTZ 25 mg daily for hypertension.  Previously  I had recommended titration of carvedilol to 9.375 mg twice a day, but she felt fatigued and reverted to her prior dose.  Dr. Forde Dandy had laboratory.  Recently on 09/21/2007.  Her total cholesterol was 209, triglycerides 150, HDL 63, LDL 116.  She continues to be on JentoDueto XR and glimepiride for her diabetes mellitus.    I saw her in February 2020.  She  had purposeful weight loss with a weight reduction from 241 down to 216  pounds.  She was recently started on very low-dose Jardiance at 5 mg per Dr. Forde Dandy.  She denied any episodes of chest tightness.  She was unaware of palpitations.  She  has been undergoing knee injections and may ultimately require knee replacement by Dr. Reynaldo Minium.    I last saw her in February 2021 she denied any episodes of chest pain., [name] denies any episodes of chest pain.  Her weight has been fairly constant with only 2 to 3 pound weight difference over the past year.  She continued to have issues with right knee soreness and may ultimately require knee replacement.  She has undergone injections.  She walks with a cane.  At times she notes trace leg swelling vitals.  She apparently is statin intolerant.  Most recently she has been taking carvedilol 9.375 mg in the morning and 6.25 mg at night, HCTZ 25 mg in addition to lisinopril 40 mg for hypertension.  She is on levothyroxine for hypothyroidism at 137 mcg.  With her diabetes she is on glimepiride, Jardiance, and Jentadueto XR.  During that evaluation, I suggested a trial of Zetia 10 mg or alternatively bempedoic acid.  Apparently, she was turned down for bempedoic acid by her insurance company.  She tried [place] for only very short duration and stopped taking this because she had noticed some leg discomfort which not certain was contributed by the Zetia.  Laboratory on November 06, 2019 showed an LDL of 108.  She recently has also noticed back discomfort and underwent a cortisone injection in her low back several weeks ago by Dr. Nelva Bush. She recently had a hemoglobin A1c checked by Dr. Forde Dandy which was 7.3.  She continues to be on carvedilol 6.25 mg tablets 1-1/2 twice a day, HCTZ 25 mg and lisinopril 40 mg daily for hypertension.  She is diabetic on glimepiride, Jardiance and Jentadueto XR.  She presents for evaluation.  Past Medical History:  Diagnosis Date  . Diabetes mellitus ([place])   . GERD (gastroesophageal reflux disease)   . Heart murmur   .  Hyperlipidemia   . Hypertension   . Thyroid disease    hypo    Past Surgical History:  Procedure Laterality Date  . ABDOMINAL HYSTERECTOMY    . CHOLECYSTECTOMY    . COLONOSCOPY    . TONSILLECTOMY      Allergies  Allergen Reactions  . Demerol [Meperidine] Other (See Comments)    Unspecified   . Macrobid [Nitrofurantoin Macrocrystal] Other (See Comments)    Unspecified   . Meclizine Other (See Comments)    Unspecified   . Sulfa Antibiotics Other (See Comments)    Unspecified     Current Outpatient Medications  Medication Sig Dispense Refill  . aspirin 325 MG tablet Take 325 mg by mouth daily.    . carvedilol (COREG) 6.25 MG tablet TAKE ONE AND ONE-HALF TABLETS TWICE A DAY WITH MEALS (PLEASE SCHEDULE AN APPOINTMENT FOR FURTHER REFILLS) (Patient taking differently: TAKE ONE AND ONE-HALF IN THE AM AND ONE TABLET IN THE PM) 270 tablet 3  . Coenzyme Q10 (CO Q 10) 100 MG CAPS Take 200 mg by mouth daily.    . empagliflozin (JARDIANCE) 10 MG TABS tablet Take 5 mg by mouth daily. 95m in the morning     . famotidine (PEPCID) 20 MG tablet Take 20 mg by mouth at bedtime.    .Marland Kitchenglimepiride (AMARYL) 2 MG tablet Take 2 mg by mouth daily before breakfast.    . hydrochlorothiazide (HYDRODIURIL) 25 MG tablet Take 1 tablet (25 mg total) by mouth daily. 90 tablet 3  . levothyroxine (SYNTHROID, LEVOTHROID) 137 MCG tablet Take 137 mcg by mouth daily before breakfast. One daily, 1 and 1/2 on Sunday    . Linagliptin-Metformin HCl ER (JENTADUETO XR) 2.01-999 MG TB24 Take 2 tablets by mouth every morning.     .Marland Kitchenlisinopril (PRINIVIL,ZESTRIL) 40 MG tablet Take 40 mg by mouth daily.    . metroNIDAZOLE (METROCREAM) 0.75 % cream      No current facility-administered medications for this visit.    Socially she is married to she has one child. There is no tobacco or alcohol use.  ROS General: Negative; No fevers, chills, or night sweats;  HEENT: Negative; No changes in vision or hearing, sinus  congestion, difficulty swallowing Pulmonary: Negative; No cough, wheezing, shortness of breath, hemoptysis Cardiovascular: Negative; No chest pain, presyncope, syncope, palpitations Occasional leg swelling GI: Negative; No nausea, vomiting, diarrhea, or abdominal pain GU: Negative; No dysuria, hematuria, or difficulty voiding Musculoskeletal: Knee discomfort; walks with cane Hematologic/Oncology: Negative; no easy bruising, bleeding Endocrine: Positive for hypothyroidism and diabetes mellitus Neuro: Negative; no changes in balance, headaches Skin: Negative; No rashes or skin lesions Psychiatric: Negative; No behavioral problems, depression Sleep: Negative; No snoring, daytime sleepiness, hypersomnolence, bruxism, restless legs, hypnogognic hallucinations, no cataplexy Other comprehensive 14 point system review is negative.   PE BP (!) 148/76   Pulse 62  Ht 5' 6" (1.676 m)   Wt 225 lb (102.1 kg)   SpO2 97%   BMI 36.32 kg/m     Repeat blood pressure by me was 130/70  Wt Readings from Last 3 Encounters:  07/17/20 225 lb (102.1 kg)  11/09/19 219 lb 9.6 oz (99.6 kg)  06/20/19 216 lb (98 kg)   General: Alert, oriented, no distress.  Skin: normal turgor, no rashes, warm and dry HEENT: Normocephalic, atraumatic. Pupils equal round and reactive to light; sclera anicteric; extraocular muscles intact; Nose without nasal septal hypertrophy Mouth/Parynx benign; Mallinpatti scale 3 Neck: No JVD, no carotid bruits; normal carotid upstroke Lungs: clear to ausculatation and percussion; no wheezing or rales Chest wall: without tenderness to palpitation Heart: PMI not displaced, RRR, s1 s2 normal, 1/6 systolic murmur, no diastolic murmur, no rubs, gallops, thrills, or heaves Abdomen: centraladiposity; soft, nontender; no hepatosplenomehaly, BS+; abdominal aorta nontender and not dilated by palpation. Back: no CVA tenderness Pulses 2+ Musculoskeletal: full range of motion, normal strength,  no joint deformities Extremities: no clubbing cyanosis or edema, Homan's sign negative  Neurologic: grossly nonfocal; Cranial nerves grossly wnl Psychologic: Normal mood and affect  ECG (independently read by me): NSR at 62; low voltage, no ectopy  February 2021 ECG (independently read by me): Sinus rhythm at 60 bpm.  No ectopy.  Normal intervals  February 2020 ECG (independently read by me): Normal sinus rhythm at 63 bpm.  No ectopy.  Normal intervals.  January 2019 ECG (independently read by me): Sinus rhythm at 66 with PAC.  ECG (independently read by me): Sinus rhythm with sinus arrhythmia and quadrigeminal PVCs.  QTc interval 410 ms.  No significant ST segment change.  June 2016 ECG (independently read by me): Normal sinus rhythm at 73 bpm.  No ectopy.  Normal intervals.  December 2015 ECG (independently read by me): Normal sinus rhythm at 65 bpm.  No ectopy.  Normal intervals.  No ST segment changes.    July 2014 ECG: Normal sinus rhythm at 63 beats per minute. Normal intervals.  LABS:  BMP Latest Ref Rng & Units 03/01/2017 09/26/2014 04/17/2013  Glucose 65 - 99 mg/dL 181(H) 239(H) 172(H)  BUN 8 - 27 mg/dL _0 Creatinine 0.57 - 1.00 mg/dL 0.78 0.69 0.64  BUN/Creat Ratio 12 - 28 22 - -  Sodium 134 - 144 mmol/L 137 137 139  Potassium 3.5 - 5.2 mmol/L 4.5 4.2 4.4  Chloride 96 - 106 mmol/L 100 102 105  CO2 20 - 29 mmol/L _1 Calcium 8.7 - 10.3 mg/dL 9.4 9.0 10.1   Hepatic Function Latest Ref Rng & Units 03/01/2017 04/17/2013 05/31/2009  Total Protein 6.0 - 8.5 g/dL 6.5 6.8 6.5  Albumin 3.5 - 4.8 g/dL 3.9 3.9 3.4(L)  AST 0 - 40 IU/L _2 ALT 0 - 32 IU/L _3 Alk Phosphatase 39 - 117 IU/L 75 67 76  Total Bilirubin 0.0 - 1.2 mg/dL 0.2 0.4 0.6   CBC Latest Ref Rng & Units 03/01/2017 06/02/2009 06/01/2009  WBC 3.4 - 10.8 x10E3/uL 10.3 11.0(H) 9.6  Hemoglobin 11.1 - 15.9 g/dL 10.3(L) 12.0 12.8  Hematocrit 34.0 - 46.6 % 33.1(L) 35.7(L) 38.4  Platelets 150 - 379  x10E3/uL 374 285 310   Lab Results  Component Value Date   MCV 82 03/01/2017   MCV 86.4 06/02/2009   MCV 86.1 06/01/2009    Lab done on 11/14/2014 at [facility] by Dr. Reynold Bowen.  Glucose was increased at 258.  Total cholesterol 213, triglycerides 207, HDL 59, LDL 113.  LFTs normal.  BUN 19, creatinine 0.8.  Laboratory from 09/23/2017 by Dr. Forde Dandy was reviewed.  He 116, current 0.9.  Glucose 230. Lipid studies as above in history of present illness. TSH 1.54.  Free T4 1 0.6.  Vitamin D 40.9.  Laboratory from October 12, 2018 was reviewed.  Lipid studies remain elevated with total cholesterol 229, HDL 70, LDL 123, triglycerides 179.  Hemoglobin A1c 7.1.  Creatinine 1.0.  Laboratory from November 06, 2019:  Total cholesterol 190, HDL 64, LDL 108, triglycerides 90. Hemoglobin A1c 6.7 BUN 19 creatinine 0.9 TSH 0.51  RADIOLOGY: No results found.  IMPRESSION:  No diagnosis found.  ASSESSMENT AND PLAN:  Ms. Khole Arterburn is a 74 year old female who has a history of moderate obesity, hypertension, type 2 diabetes mellitus, and hypothyroidism.  She continues to be on amedical regimen of carvedilol 9.375 mg twice a day, HCTZ 25 mg, and lisinopril 40 mg for hypertension.  Her blood pressure today when rechecked by me was 130/70.  She is diabetic and a recent hemoglobin A1c was 7.3 when checked by Dr. Forde Dandy but she states she had recently had a cortisone injection to her back.  She continues to be on Jardiance 10 mg, Jentadueto XR in addition to glimepiride.  She has mild hyperlipidemia.  With her diabetes mellitus, target LDL is less than 70.  Last year I attempted to try Zetia but apparently she thought this contributed to leg soreness and apparently when given a prescription for bempedoic acid this was denied by her insurance.  As result she has not been on therapy.  I have recommended that she retry a challenge of Zetia since I do not believe this contributed to any  significant myalgias.  She often has leg discomfort and walks with a cane.  She will be undergoing repeat laboratory with Dr. Forde Dandy in February and hopefully she will continue therapy at least until that evaluation.  She continues to be on levothyroxine 137 mcg for hypothyroidism.  She is not having any chest pain symptomatology.  There is no significant edema today on her low-dose diuretic.  We discussed the importance of weight loss particularly with her BMI of 36.3.  I will see her in 1 year for follow-up evaluation or sooner as necessary.  Troy Sine, MD, [facility]  07/17/2020 1:40 PM

## 2020-07-21 ENCOUNTER — Encounter: Payer: Medicare Other | Admitting: Obstetrics & Gynecology

## 2020-07-23 DIAGNOSIS — M47816 Spondylosis without myelopathy or radiculopathy, lumbar region: Secondary | ICD-10-CM | POA: Diagnosis not present

## 2020-07-25 NOTE — Addendum Note (Signed)
Addended by: Wonda Horner on: 07/25/2020 02:03 PM   Modules accepted: Orders

## 2020-08-13 ENCOUNTER — Encounter: Payer: Self-pay | Admitting: Gastroenterology

## 2020-08-19 ENCOUNTER — Other Ambulatory Visit: Payer: Self-pay

## 2020-08-19 ENCOUNTER — Ambulatory Visit (INDEPENDENT_AMBULATORY_CARE_PROVIDER_SITE_OTHER): Payer: Medicare Other | Admitting: Obstetrics & Gynecology

## 2020-08-19 ENCOUNTER — Encounter: Payer: Self-pay | Admitting: Obstetrics & Gynecology

## 2020-08-19 DIAGNOSIS — R350 Frequency of micturition: Secondary | ICD-10-CM

## 2020-08-19 DIAGNOSIS — N3281 Overactive bladder: Secondary | ICD-10-CM | POA: Diagnosis not present

## 2020-08-19 MED ORDER — TOLTERODINE TARTRATE ER 2 MG PO CP24: 2.0000 mg | ORAL_CAPSULE | Freq: Every day | ORAL | 6 refills | Status: DC

## 2020-08-19 NOTE — Progress Notes (Signed)
    Pamela [name] [birth year] [id number]        74 y.o.  G1P1L1    RP: Overactive bladder for many years, worsening  HPI:  C/O overactive bladder. Worse during the night with urinary incontinence.  Sometimes leaks during the day when not close to a bathroom.  Drinking decaf coffee and 1 tea a day.  Likes chocolate. No Sx of UTI otherwise. TAH/BSO 1996 Benign.  Well on no hormone replacement therapy.  No pelvic pain.  Abstinent. Bowel movements normal.   OB History  Gravida Para Term Preterm AB Living  1 1       1   SAB TAB Ectopic Multiple Live Births               # Outcome Date GA Lbr Len/2nd Weight Sex Delivery Anes PTL Lv  1 Para             Past medical history,surgical history, problem list, medications, allergies, family history and social history were all reviewed and documented in the EPIC chart.   Directed ROS with pertinent positives and negatives documented in the history of present illness/assessment and plan.  Exam:  There were no vitals filed for this visit. General appearance:  Normal  Abdomen: Normal  Gynecologic exam: Vulva normal.  Bimanual exam:  S/P TAH.  No prolapse.  No pelvic mass, NT.  U/A Negative   Assessment/Plan:  74 y.o. G1P1   1. Overactive bladder Overactive bladder with urgency incontinence.  Recommendation made to decrease bladder irritants such as caffeine still fill in and chocolate.  Patient already drinks good amounts of water, encouraged to continue.  [place] exercises recommended.  Physical therapy as needed.  Patient offered urology referral, but prefers attempting treatment with Detrol LA first.  Usage, risks and side effects reviewed with patient.  Detrol LA 2 mg per mouth daily prescribed.  If patient is not better after about 3 months, will refer to urology for evaluation.  2. Urinary frequency U/A Negative. - Urinalysis,Complete w/RFL Culture  Other orders - tolterodine (DETROL LA) 2 MG 24 hr capsule; Take 1 capsule (2 mg  total) by mouth daily.  Princess Bruins MD, 12:08 PM 08/19/2020

## 2020-08-20 ENCOUNTER — Encounter: Payer: Self-pay | Admitting: Obstetrics & Gynecology

## 2020-08-20 LAB — URINALYSIS, COMPLETE W/RFL CULTURE
Bacteria, UA: NONE SEEN /HPF
Bilirubin Urine: NEGATIVE
Hgb urine dipstick: NEGATIVE
Hyaline Cast: NONE SEEN /LPF
Ketones, ur: NEGATIVE
Leukocyte Esterase: NEGATIVE
Nitrites, Initial: NEGATIVE
Protein, ur: NEGATIVE
RBC / HPF: NONE SEEN /HPF (ref 0–2)
Specific Gravity, Urine: 1.015 (ref 1.001–1.03)
WBC, UA: NONE SEEN /HPF (ref 0–5)
pH: 5 (ref 5.0–8.0)

## 2020-08-20 LAB — NO CULTURE INDICATED

## 2020-09-09 ENCOUNTER — Other Ambulatory Visit: Payer: Self-pay

## 2020-09-09 ENCOUNTER — Encounter: Payer: Self-pay | Admitting: Podiatry

## 2020-09-09 ENCOUNTER — Ambulatory Visit (INDEPENDENT_AMBULATORY_CARE_PROVIDER_SITE_OTHER): Payer: Medicare Other | Admitting: Podiatry

## 2020-09-09 DIAGNOSIS — E119 Type 2 diabetes mellitus without complications: Secondary | ICD-10-CM

## 2020-09-09 DIAGNOSIS — M2011 Hallux valgus (acquired), right foot: Secondary | ICD-10-CM

## 2020-09-09 DIAGNOSIS — M79674 Pain in right toe(s): Secondary | ICD-10-CM

## 2020-09-09 DIAGNOSIS — M79675 Pain in left toe(s): Secondary | ICD-10-CM | POA: Diagnosis not present

## 2020-09-09 DIAGNOSIS — B351 Tinea unguium: Secondary | ICD-10-CM | POA: Diagnosis not present

## 2020-09-09 DIAGNOSIS — M2012 Hallux valgus (acquired), left foot: Secondary | ICD-10-CM

## 2020-09-12 NOTE — Progress Notes (Signed)
Subjective:  Patient ID: Pamela [name], female    DOB: 1945/09/18,  MRN: [id number]  74 y.o. female presents with preventative diabetic foot care and painful thick toenails that are difficult to trim. Pain interferes with ambulation. Aggravating factors include wearing enclosed shoe gear. Pain is relieved with periodic professional debridement.   She states her blood sugar was 125 mg/dL this morning.    Review of Systems: Negative except as noted in the HPI.  Past Medical History:  Diagnosis Date  . Diabetes mellitus (HCC)   . GERD (gastroesophageal reflux disease)   . Heart murmur   . Hyperlipidemia   . Hypertension   . Thyroid disease    hypo   Past Surgical History:  Procedure Laterality Date  . ABDOMINAL HYSTERECTOMY    . CHOLECYSTECTOMY    . COLONOSCOPY    . TONSILLECTOMY     Patient Active Problem List   Diagnosis Date Noted  . Age-related nuclear cataract of right eye 04/15/2020  . Age-related nuclear cataract of left eye 04/15/2020  . Vitreomacular adhesion of left eye 04/15/2020  . Vitreomacular adhesion of right eye 04/15/2020  . History of hypothyroidism 03/12/2015  . Obesity (BMI 30-39.9) 04/07/2013  . Hypertension 04/07/2013  . Diabetes mellitus without complication (HCC) 04/07/2013  . Hyperlipidemia with target LDL less than 70 04/07/2013    Current Outpatient Medications:  .  aspirin 325 MG tablet, Take 325 mg by mouth daily., Disp: , Rfl:  .  carvedilol (COREG) 6.25 MG tablet, TAKE ONE AND ONE-HALF TABLETS TWICE A DAY WITH MEALS (PLEASE SCHEDULE AN APPOINTMENT FOR FURTHER REFILLS) (Patient taking differently: TAKE ONE AND ONE-HALF IN THE AM AND ONE TABLET IN THE PM), Disp: 270 tablet, Rfl: 3 .  Coenzyme Q10 (CO Q 10) 100 MG CAPS, Take 200 mg by mouth daily., Disp: , Rfl:  .  empagliflozin (JARDIANCE) 10 MG TABS tablet, Take 5 mg by mouth daily. 5mg  in the morning , Disp: , Rfl:  .  ezetimibe (ZETIA) 10 MG tablet, Take 1 tablet (10 mg total) by mouth  daily., Disp: 30 tablet, Rfl: 2 .  famotidine (PEPCID) 20 MG tablet, Take 20 mg by mouth at bedtime., Disp: , Rfl:  .  glimepiride (AMARYL) 2 MG tablet, Take 2 mg by mouth daily before breakfast., Disp: , Rfl:  .  hydrochlorothiazide (HYDRODIURIL) 25 MG tablet, Take 1 tablet (25 mg total) by mouth daily., Disp: 90 tablet, Rfl: 3 .  levothyroxine (SYNTHROID, LEVOTHROID) 137 MCG tablet, Take 137 mcg by mouth daily before breakfast. One daily, 1 and 1/2 on Sunday, Disp: , Rfl:  .  Linagliptin-Metformin HCl ER (JENTADUETO XR) 2.01-999 MG TB24, Take 2 tablets by mouth every morning. , Disp: , Rfl:  .  lisinopril (PRINIVIL,ZESTRIL) 40 MG tablet, Take 40 mg by mouth daily., Disp: , Rfl:  .  metroNIDAZOLE (METROCREAM) 0.75 % cream, , Disp: , Rfl:  .  tolterodine (DETROL LA) 2 MG 24 hr capsule, Take 1 capsule (2 mg total) by mouth daily., Disp: 30 capsule, Rfl: 6 Allergies  Allergen Reactions  . Demerol [Meperidine] Other (See Comments)    Unspecified   . Macrobid [Nitrofurantoin Macrocrystal] Other (See Comments)    Unspecified   . Meclizine Other (See Comments)    Unspecified   . Sulfa Antibiotics Other (See Comments)    Unspecified    Social History   Tobacco Use  Smoking Status Never Smoker  Smokeless Tobacco Never Used   Objective:  There were no vitals filed  for this visit. Constitutional Mrs. Signor is a pleasant 74 y.o. Caucasian female in NAD. AAO x 3.  Vascular Capillary refill time to digits immediate b/l. Palpable pedal pulses b/l LE. Pedal hair present. Lower extremity skin temperature gradient within normal limits. No cyanosis or clubbing noted.  Neurologic Normal speech. Oriented to person, place, and time. Protective sensation intact 5/5 intact bilaterally with 10g monofilament b/l. Vibratory sensation intact b/l. Proprioception intact bilaterally.  Dermatologic Pedal skin with normal turgor, texture and tone bilaterally. No open wounds bilaterally. No interdigital  macerations bilaterally. Toenails 1-5 b/l elongated, discolored, dystrophic, thickened, crumbly with subungual debris and tenderness to dorsal palpation. Incurvated nailplate medial border(s) L hallux.  Nail border hypertrophy absent. Sign(s) of infection: no clinical signs of infection noted on examination today..  Orthopedic: Normal muscle strength 5/5 to all lower extremity muscle groups bilaterally. No pain crepitus or joint limitation noted with ROM b/l. Hallux valgus with bunion deformity noted b/l lower extremities.   Assessment:   1. Pain due to onychomycosis of toenails of both feet   2. Hallux valgus, acquired, bilateral   3. Diabetes mellitus without complication ([place])    Plan:  Patient was evaluated and treated and all questions answered.  Onychomycosis with pain -Nails palliatively debridement as below. -Educated on self-care  Procedure: Nail Debridement Rationale: Pain Type of Debridement: manual, sharp debridement. Instrumentation: Nail nipper, rotary burr. Number of Nails: 10  -Examined patient. -No new findings. No new orders. -Toenails 1-5 b/l were debrided in length and girth with sterile nail nippers and dremel without iatrogenic bleeding.  -Offending nail border debrided and curretaged L hallux utilizing sterile nail nipper and currette. Border cleansed with alcohol and triple antibiotic applied. No further treatment required by patient/caregiver. -Patient to report any pedal injuries to medical professional immediately. -Patient to continue soft, supportive shoe gear daily. -Patient/POA to call should there be question/concern in the interim.  Return in about 9 weeks (around 11/11/2020).  Marzetta Board, DPM

## 2020-09-23 DIAGNOSIS — M545 Low back pain, unspecified: Secondary | ICD-10-CM | POA: Diagnosis not present

## 2020-09-25 DIAGNOSIS — H04123 Dry eye syndrome of bilateral lacrimal glands: Secondary | ICD-10-CM | POA: Diagnosis not present

## 2020-09-25 DIAGNOSIS — H2513 Age-related nuclear cataract, bilateral: Secondary | ICD-10-CM | POA: Diagnosis not present

## 2020-09-25 DIAGNOSIS — H25013 Cortical age-related cataract, bilateral: Secondary | ICD-10-CM | POA: Diagnosis not present

## 2020-09-25 DIAGNOSIS — H35033 Hypertensive retinopathy, bilateral: Secondary | ICD-10-CM | POA: Diagnosis not present

## 2020-10-01 ENCOUNTER — Telehealth: Payer: Self-pay | Admitting: *Deleted

## 2020-10-01 ENCOUNTER — Encounter: Payer: Medicare Other | Admitting: Obstetrics & Gynecology

## 2020-10-01 NOTE — Telephone Encounter (Signed)
Patient was seen and prescribed detrol LA 2 mg at office visit on 08/19/20 reports she went to the eye doctor and reports she had severe dry eyes. Patient completed 30 days of medication and stopped Rx, reports her eye doctor told her detrol LA 2 mg side effect is dry eye in some patients. Patient asked if you have any other alternative medications you would recommend? She is doing okay now with OAB, but thought she would check to see if any other medication would be an option. Please advise

## 2020-10-03 NOTE — Telephone Encounter (Signed)
Recommend observation at this time.  Eventually, Vibegron could be attempted.  It is a newer medication with less side effects.

## 2020-10-03 NOTE — Telephone Encounter (Signed)
Patient informed. 

## 2020-10-08 DIAGNOSIS — H2513 Age-related nuclear cataract, bilateral: Secondary | ICD-10-CM | POA: Diagnosis not present

## 2020-10-08 DIAGNOSIS — H04123 Dry eye syndrome of bilateral lacrimal glands: Secondary | ICD-10-CM | POA: Diagnosis not present

## 2020-10-08 DIAGNOSIS — H25041 Posterior subcapsular polar age-related cataract, right eye: Secondary | ICD-10-CM | POA: Diagnosis not present

## 2020-10-08 DIAGNOSIS — H5319 Other subjective visual disturbances: Secondary | ICD-10-CM | POA: Diagnosis not present

## 2020-10-08 DIAGNOSIS — H25013 Cortical age-related cataract, bilateral: Secondary | ICD-10-CM | POA: Diagnosis not present

## 2020-10-14 ENCOUNTER — Encounter: Payer: Medicare Other | Admitting: Gastroenterology

## 2020-10-16 ENCOUNTER — Other Ambulatory Visit: Payer: Self-pay | Admitting: Cardiovascular Disease

## 2020-10-24 ENCOUNTER — Encounter: Payer: Self-pay | Admitting: Gastroenterology

## 2020-10-24 ENCOUNTER — Other Ambulatory Visit: Payer: Self-pay | Admitting: Gastroenterology

## 2020-10-24 ENCOUNTER — Ambulatory Visit (INDEPENDENT_AMBULATORY_CARE_PROVIDER_SITE_OTHER): Payer: Medicare Other | Admitting: Gastroenterology

## 2020-10-24 VITALS — BP 140/60 | HR 68 | Ht 64.5 in | Wt 225.2 lb

## 2020-10-24 DIAGNOSIS — K219 Gastro-esophageal reflux disease without esophagitis: Secondary | ICD-10-CM

## 2020-10-24 DIAGNOSIS — Z86010 Personal history of colonic polyps: Secondary | ICD-10-CM

## 2020-10-24 MED ORDER — PANTOPRAZOLE SODIUM 40 MG PO TBEC: 40.0000 mg | DELAYED_RELEASE_TABLET | Freq: Every day | ORAL | 0 refills | Status: DC

## 2020-10-24 NOTE — Progress Notes (Signed)
Referring Provider: Reynold Bowen, MD Primary Care Physician:  Reynold Bowen, MD  Reason for Consultation:  Reflux   IMPRESSION:  History of colon polyps    - 2 tubular adenomas 2015 Reflux with symptoms despite famotidine Diverticulosis   PLAN: - Start pantoprazole 40 mg QAM (requests 30 day supply) - EGD with esophageal biopsies to confirm diagnosis and exclude complications of reflux - Surveillance Colonoscopy  Please see the "Patient Instructions" section for addition details about the plan.  HPI: Pamela [name] is a 75 y.o. female self-referred for reflux. The history is obtained through the patient and review of her electronic health record.  She has obesity, hypertension, type 2 diabetes mellitus, and hyperlipidemia. Accompanied to this appointment by her husband, who contributes to the history.   She is due surveillance colonoscopy.   Colonoscopy 2005: large IC valve but no polyps Colonoscopy 2015: pancolonic diverticulosis and 2 tubular adenomas Surveillance recommended in 5 years  When she was contacted to schedule the procedure, she requested evaluation for frequent indigestion.  Understands that she has a hiatal hernia and has been on famotidine for many years. Has frequent post-prandial urgency and bloating. Worsened with salad. No accidents but she is afraid she will have one.   Her husband notes that she eats ice at night.   No known family history of colon cancer or polyps. No family history of uterine/endometrial cancer, pancreatic cancer or gastric/stomach cancer.  No recent labs. No prior abdominal imaging.               Past Medical History:  Diagnosis Date  . Diabetes mellitus ([place])   . GERD (gastroesophageal reflux disease)   . Heart murmur   . Hyperlipidemia   . Hypertension   . Thyroid disease    hypo    Past Surgical History:  Procedure Laterality Date  . ABDOMINAL HYSTERECTOMY    . CHOLECYSTECTOMY    . COLONOSCOPY    . TONSILLECTOMY       Current Outpatient Medications  Medication Sig Dispense Refill  . aspirin 325 MG tablet Take 325 mg by mouth daily.    . carvedilol (COREG) 6.25 MG tablet TAKE ONE AND ONE-HALF TABLETS TWICE A DAY WITH MEALS (PLEASE SCHEDULE AN APPOINTMENT FOR FURTHER REFILLS) (Patient taking differently: TAKE ONE AND ONE-HALF IN THE AM AND ONE TABLET IN THE PM) 270 tablet 3  . Coenzyme Q10 (CO Q 10) 100 MG CAPS Take 200 mg by mouth daily.    . empagliflozin (JARDIANCE) 10 MG TABS tablet Take 5 mg by mouth daily. 5mg  in the morning     . ezetimibe (ZETIA) 10 MG tablet TAKE 1 TABLET DAILY 90 tablet 3  . famotidine (PEPCID) 20 MG tablet Take 20 mg by mouth at bedtime.    Marland Kitchen glimepiride (AMARYL) 2 MG tablet Take 2 mg by mouth daily before breakfast.    . hydrochlorothiazide (HYDRODIURIL) 25 MG tablet Take 1 tablet (25 mg total) by mouth daily. 90 tablet 3  . levothyroxine (SYNTHROID, LEVOTHROID) 137 MCG tablet Take 137 mcg by mouth daily before breakfast. One daily, 1 and 1/2 on Sunday    . Linagliptin-Metformin HCl ER (JENTADUETO XR) 2.01-999 MG TB24 Take 2 tablets by mouth every morning.     Marland Kitchen lisinopril (PRINIVIL,ZESTRIL) 40 MG tablet Take 40 mg by mouth daily.    . metroNIDAZOLE (METROCREAM) 0.75 % cream     . tolterodine (DETROL LA) 2 MG 24 hr capsule Take 1 capsule (2 mg total) by  mouth daily. 30 capsule 6   No current facility-administered medications for this visit.    Allergies as of 10/24/2020 - Review Complete 09/09/2020  Allergen Reaction Noted  . Demerol [meperidine] Other (See Comments) 12/07/2012  . Macrobid [nitrofurantoin macrocrystal] Other (See Comments) 12/07/2012  . Meclizine Other (See Comments) 12/07/2012  . Sulfa antibiotics Other (See Comments) 12/07/2012    Family History  Problem Relation Age of Onset  . Hypertension Mother        also cerebral hemorrhage  . Hypertension Father   . Diabetes Father        also [place]  . Cancer - Other Paternal Grandmother         breast cancer  . Heart Problems Sister        heart problems, pacer in 2011  . Hyperlipidemia Sister   . Heart failure Sister   . Hypertension Sister   . Colon cancer Neg Hx   . Esophageal cancer Neg Hx   . Stomach cancer Neg Hx   . Rectal cancer Neg Hx   . Breast cancer Neg Hx     Social History   Socioeconomic History  . Marital status: Married    Spouse name: Not on file  . Number of children: Not on file  . Years of education: Not on file  . Highest education level: Not on file  Occupational History  . Not on file  Tobacco Use  . Smoking status: Never Smoker  . Smokeless tobacco: Never Used  Vaping Use  . Vaping Use: Never used  Substance and Sexual Activity  . Alcohol use: No  . Drug use: No  . Sexual activity: Not Currently    Partners: Male    Comment: intercourse- 39, partners-1, Married- 83 yrs  Other Topics Concern  . Not on file  Social History Narrative  . Not on file   Social Determinants of Health   Financial Resource Strain: Not on file  Food Insecurity: Not on file  Transportation Needs: Not on file  Physical Activity: Not on file  Stress: Not on file  Social Connections: Not on file  Intimate Partner Violence: Not on file    Review of Systems: 12 system ROS is negative except as noted above with the addition of vision change, fatigue, sleeping problems, excessive urination, frequency, urine leakage.   Physical Exam: General:   Alert,  well-nourished, pleasant and cooperative in NAD Head:  Normocephalic and atraumatic. Eyes:  Sclera clear, no icterus.   Conjunctiva pink. Ears:  Normal auditory acuity. Nose:  No deformity, discharge,  or lesions. Mouth:  No deformity or lesions.   Neck:  Supple; no masses or thyromegaly. Lungs:  Clear throughout to auscultation.   No wheezes. Heart:  Regular rate and rhythm; no murmurs. Abdomen:  Soft, nontender, nondistended, normal bowel sounds, no rebound or guarding. No hepatosplenomegaly.   Rectal:   Deferred  Msk:  Symmetrical. No boney deformities LAD: No inguinal or umbilical LAD Extremities:  No clubbing or edema. Neurologic:  Alert and  oriented x4;  grossly nonfocal Skin:  Intact without significant lesions or rashes. Psych:  Alert and cooperative. Normal mood and affect.     L. Tarri Glenn, MD, MPH 10/24/2020, 3:20 PM

## 2020-10-24 NOTE — Patient Instructions (Addendum)
RECOMMENDATION(S):   PRESCRIPTION MEDICATION(S):   We have sent the following medication(s) to your pharmacy:  . Pantoprazole 40mg  - Please take 1 tablet every morning  NOTE: If your medication(s) requires a PRIOR AUTHORIZATION, we will receive notification from your pharmacy. Once received, the process to submit for approval may take up to 7-10 business days. You will be contacted about any denials we have received from your insurance company as well as alternatives recommended by your provider.  COLONOSCOPY AND ENDOSCOPY:   . You have been scheduled for an endoscopy and a colonoscopy. Please follow the written instructions given to you at your visit today.  PREP:   . Please pick up your prep supplies at the pharmacy within the next 1-3 days.  INHALERS:   . If you use inhalers (even only as needed), please bring them with you on the day of your procedure.  BMI:  If you are age 64 or older, your body mass index should be between 23-30. Your Body mass index is 38.07 kg/m. If this is out of the aforementioned range listed, please consider follow up with your Primary Care Provider.  Thank you for trusting me with your gastrointestinal care!    Thornton Park, MD, MPH

## 2020-10-28 DIAGNOSIS — H2513 Age-related nuclear cataract, bilateral: Secondary | ICD-10-CM | POA: Diagnosis not present

## 2020-10-28 DIAGNOSIS — H25041 Posterior subcapsular polar age-related cataract, right eye: Secondary | ICD-10-CM | POA: Diagnosis not present

## 2020-10-28 DIAGNOSIS — H04123 Dry eye syndrome of bilateral lacrimal glands: Secondary | ICD-10-CM | POA: Diagnosis not present

## 2020-10-28 DIAGNOSIS — H25013 Cortical age-related cataract, bilateral: Secondary | ICD-10-CM | POA: Diagnosis not present

## 2020-10-28 DIAGNOSIS — H2511 Age-related nuclear cataract, right eye: Secondary | ICD-10-CM | POA: Diagnosis not present

## 2020-11-04 ENCOUNTER — Other Ambulatory Visit: Payer: Self-pay | Admitting: Cardiovascular Disease

## 2020-11-06 DIAGNOSIS — N1831 Chronic kidney disease, stage 3a: Secondary | ICD-10-CM | POA: Diagnosis not present

## 2020-11-06 DIAGNOSIS — E785 Hyperlipidemia, unspecified: Secondary | ICD-10-CM | POA: Diagnosis not present

## 2020-11-06 DIAGNOSIS — L84 Corns and callosities: Secondary | ICD-10-CM | POA: Diagnosis not present

## 2020-11-06 DIAGNOSIS — I129 Hypertensive chronic kidney disease with stage 1 through stage 4 chronic kidney disease, or unspecified chronic kidney disease: Secondary | ICD-10-CM | POA: Diagnosis not present

## 2020-11-06 DIAGNOSIS — I5189 Other ill-defined heart diseases: Secondary | ICD-10-CM | POA: Diagnosis not present

## 2020-11-06 DIAGNOSIS — E669 Obesity, unspecified: Secondary | ICD-10-CM | POA: Diagnosis not present

## 2020-11-06 DIAGNOSIS — I10 Essential (primary) hypertension: Secondary | ICD-10-CM | POA: Diagnosis not present

## 2020-11-06 DIAGNOSIS — E559 Vitamin D deficiency, unspecified: Secondary | ICD-10-CM | POA: Diagnosis not present

## 2020-11-06 DIAGNOSIS — R269 Unspecified abnormalities of gait and mobility: Secondary | ICD-10-CM | POA: Diagnosis not present

## 2020-11-06 DIAGNOSIS — E039 Hypothyroidism, unspecified: Secondary | ICD-10-CM | POA: Diagnosis not present

## 2020-11-06 DIAGNOSIS — H43821 Vitreomacular adhesion, right eye: Secondary | ICD-10-CM | POA: Diagnosis not present

## 2020-11-06 DIAGNOSIS — E1142 Type 2 diabetes mellitus with diabetic polyneuropathy: Secondary | ICD-10-CM | POA: Diagnosis not present

## 2020-11-11 ENCOUNTER — Encounter: Payer: Self-pay | Admitting: Podiatry

## 2020-11-11 ENCOUNTER — Ambulatory Visit (INDEPENDENT_AMBULATORY_CARE_PROVIDER_SITE_OTHER): Payer: Medicare Other | Admitting: Podiatry

## 2020-11-11 ENCOUNTER — Other Ambulatory Visit: Payer: Self-pay

## 2020-11-11 DIAGNOSIS — M79675 Pain in left toe(s): Secondary | ICD-10-CM | POA: Diagnosis not present

## 2020-11-11 DIAGNOSIS — E119 Type 2 diabetes mellitus without complications: Secondary | ICD-10-CM

## 2020-11-11 DIAGNOSIS — M2011 Hallux valgus (acquired), right foot: Secondary | ICD-10-CM

## 2020-11-11 DIAGNOSIS — B351 Tinea unguium: Secondary | ICD-10-CM

## 2020-11-11 DIAGNOSIS — M79674 Pain in right toe(s): Secondary | ICD-10-CM

## 2020-11-11 DIAGNOSIS — M2012 Hallux valgus (acquired), left foot: Secondary | ICD-10-CM

## 2020-11-12 ENCOUNTER — Encounter (INDEPENDENT_AMBULATORY_CARE_PROVIDER_SITE_OTHER): Payer: Self-pay

## 2020-11-13 ENCOUNTER — Encounter (INDEPENDENT_AMBULATORY_CARE_PROVIDER_SITE_OTHER): Payer: Self-pay

## 2020-11-16 NOTE — Progress Notes (Signed)
  Subjective:  Patient ID: Pamela [name], female    DOB: [birth year],  MRN: [id number]  75 y.o. female presents with preventative diabetic foot care and painful thick toenails that are difficult to trim. Pain interferes with ambulation. Aggravating factors include wearing enclosed shoe gear. Pain is relieved with periodic professional debridement.   She states her blood sugar was 125 mg/dL this morning.  PCP is Dr. Reynold Bowen. Last visit was 07/09/2020.  Review of Systems: Negative except as noted in the HPI.   Allergies  Allergen Reactions  . Demerol [Meperidine] Other (See Comments)    Unspecified   . Macrobid [Nitrofurantoin Macrocrystal] Other (See Comments)    Unspecified   . Meclizine Other (See Comments)    Unspecified   . Sulfa Antibiotics Other (See Comments)    Unspecified    Objective:  There were no vitals filed for this visit. Constitutional Pamela [name] is a pleasant 75 y.o. Caucasian female in NAD. AAO x 3.  Vascular Capillary refill time to digits immediate b/l. Palpable pedal pulses b/l LE. Pedal hair present. Lower extremity skin temperature gradient within normal limits. No cyanosis or clubbing noted.  Neurologic Normal speech. Oriented to person, place, and time. Protective sensation intact 5/5 intact bilaterally with 10g monofilament b/l. Vibratory sensation intact b/l. Proprioception intact bilaterally.  Dermatologic Pedal skin with normal turgor, texture and tone bilaterally. No open wounds bilaterally. No interdigital macerations bilaterally. Toenails 1-5 b/l elongated, discolored, dystrophic, thickened, crumbly with subungual debris and tenderness to dorsal palpation. Incurvated nailplate medial border(s) L hallux.  Nail border hypertrophy absent. Sign(s) of infection: no clinical signs of infection noted on examination today..  Orthopedic: Normal muscle strength 5/5 to all lower extremity muscle groups bilaterally. No pain crepitus or joint limitation noted  with ROM b/l. Hallux valgus with bunion deformity noted b/l lower extremities.   Assessment:   1. Pain due to onychomycosis of toenails of both feet   2. Hallux valgus, acquired, bilateral   3. Diabetes mellitus without complication ([place])    Plan:  Patient was evaluated and treated and all questions answered.  Onychomycosis with pain -Nails palliatively debridement as below. -Educated on self-care  Procedure: Nail Debridement Rationale: Pain Type of Debridement: manual, sharp debridement. Instrumentation: Nail nipper, rotary burr. Number of Nails: 10  -Examined patient. -No new findings. No new orders. -Toenails 1-5 b/l were debrided in length and girth with sterile nail nippers and dremel without iatrogenic bleeding.  -Offending nail border debrided and curretaged L hallux utilizing sterile nail nipper and currette. Border cleansed with alcohol and triple antibiotic applied. No further treatment required by patient/caregiver. -Patient to report any pedal injuries to medical professional immediately. -Patient to continue soft, supportive shoe gear daily. -Patient/POA to call should there be question/concern in the interim.  Return in about 9 weeks (around 01/13/2021).  Marzetta Board, DPM

## 2020-12-11 ENCOUNTER — Telehealth: Payer: Self-pay | Admitting: Gastroenterology

## 2020-12-11 NOTE — Telephone Encounter (Signed)
Spoke with pt and reviewed with her that she is to take her diabetic pills the day before her procedures but she does not take them the day of her procedure. Pt verbalized understanding.

## 2020-12-15 NOTE — Telephone Encounter (Signed)
Inbound call from patient requesting another call from a nurse please; has additional questions about medication.

## 2020-12-15 NOTE — Telephone Encounter (Signed)
Reviewed instructions with pt and questions answered.

## 2020-12-16 ENCOUNTER — Encounter: Payer: Self-pay | Admitting: Gastroenterology

## 2020-12-16 ENCOUNTER — Ambulatory Visit (AMBULATORY_SURGERY_CENTER): Payer: Medicare Other | Admitting: Gastroenterology

## 2020-12-16 ENCOUNTER — Other Ambulatory Visit: Payer: Self-pay

## 2020-12-16 VITALS — BP 126/57 | HR 67 | Temp 97.3°F | Resp 18 | Ht 64.0 in | Wt 225.0 lb

## 2020-12-16 DIAGNOSIS — Z86010 Personal history of colonic polyps: Secondary | ICD-10-CM | POA: Diagnosis not present

## 2020-12-16 DIAGNOSIS — K449 Diaphragmatic hernia without obstruction or gangrene: Secondary | ICD-10-CM | POA: Diagnosis not present

## 2020-12-16 DIAGNOSIS — E119 Type 2 diabetes mellitus without complications: Secondary | ICD-10-CM | POA: Diagnosis not present

## 2020-12-16 DIAGNOSIS — D124 Benign neoplasm of descending colon: Secondary | ICD-10-CM

## 2020-12-16 DIAGNOSIS — K2970 Gastritis, unspecified, without bleeding: Secondary | ICD-10-CM | POA: Diagnosis not present

## 2020-12-16 DIAGNOSIS — B9681 Helicobacter pylori [H. pylori] as the cause of diseases classified elsewhere: Secondary | ICD-10-CM

## 2020-12-16 DIAGNOSIS — K219 Gastro-esophageal reflux disease without esophagitis: Secondary | ICD-10-CM

## 2020-12-16 DIAGNOSIS — I10 Essential (primary) hypertension: Secondary | ICD-10-CM | POA: Diagnosis not present

## 2020-12-16 HISTORY — PX: COLONOSCOPY: SHX174

## 2020-12-16 MED ORDER — SODIUM CHLORIDE 0.9 % IV SOLN: 500.0000 mL | Freq: Once | INTRAVENOUS | Status: DC

## 2020-12-16 NOTE — Op Note (Signed)
[facility] Patient Name: Pamela [name] Procedure Date: 12/16/2020 2:01 PM MRN: [id number] Endoscopist: Thornton Park MD, MD Age: 75 Referring MD:  Date of Birth: [birth year] Gender: Female Account #: [id number] Procedure:                Colonoscopy Indications:              Surveillance: Personal history of adenomatous                            polyps on last colonoscopy > 5 years ago                           2 tubular adenomas 2015 Medicines:                Monitored Anesthesia Care Procedure:                Pre-Anesthesia Assessment:                           - Prior to the procedure, a History and Physical                            was performed, and patient medications and                            allergies were reviewed. The patient's tolerance of                            previous anesthesia was also reviewed. The risks                            and benefits of the procedure and the sedation                            options and risks were discussed with the patient.                            All questions were answered, and informed consent                            was obtained. Prior Anticoagulants: The patient has                            taken no previous anticoagulant or antiplatelet                            agents. ASA Grade Assessment: III - A patient with                            severe systemic disease. After reviewing the risks                            and benefits, the patient was deemed in  satisfactory condition to undergo the procedure.                           After obtaining informed consent, the colonoscope                            was passed under direct vision. Throughout the                            procedure, the patient's blood pressure, pulse, and                            oxygen saturations were monitored continuously. The                            Olympus PFC-[id number] (#[id number]) Colonoscope was                             introduced through the anus and advanced to the the                            cecum, identified by appendiceal orifice and                            ileocecal valve. The colonoscopy was performed with                            moderate difficulty due to multiple diverticula in                            the colon, a redundant colon and significant                            looping. Successful completion of the procedure was                            aided by changing the patient's position,                            withdrawing and reinserting the scope, withdrawing                            the scope and replacing with the pediatric                            colonoscope and applying abdominal pressure. The                            patient tolerated the procedure well. The quality                            of the bowel preparation was good. The ileocecal  valve, appendiceal orifice, and rectum were                            photographed. Scope In: 2:02:51 PM Scope Out: 2:24:37 PM Scope Withdrawal Time: 0 hours 10 minutes 23 seconds  Total Procedure Duration: 0 hours 21 minutes 46 seconds  Findings:                 The perianal and digital rectal examinations were                            normal.                           Multiple small and large-mouthed diverticula were                            found in the sigmoid colon and descending colon.                           A 2 mm polyp was found in the descending colon. The                            polyp was sessile. The polyp was removed with a                            cold snare. Resection and retrieval were complete.                            Estimated blood loss was minimal.                           Anal papilla(e) were hypertrophied.                           The exam was otherwise without abnormality on                            direct and retroflexion  views. Complications:            No immediate complications. Estimated blood loss:                            Minimal. Estimated Blood Loss:     Estimated blood loss was minimal. Impression:               - Diverticulosis in the sigmoid colon and in the                            descending colon.                           - One 2 mm polyp in the descending colon, removed                            with a cold snare. Resected and retrieved.                           -  The examination was otherwise normal on direct                            and retroflexion views. Recommendation:           - Patient has a contact number available for                            emergencies. The signs and symptoms of potential                            delayed complications were discussed with the                            patient. Return to normal activities tomorrow.                            Written discharge instructions were provided to the                            patient.                           - High fiber diet.                           - Continue present medications.                           - Await pathology results.                           - Emerging evidence supports eating a diet of                            fruits, vegetables, grains, calcium, and yogurt                            while reducing red meat and alcohol may reduce the                            risk of colon cancer.                           - Thank you for allowing me to be involved in your                            colon cancer prevention. Thornton Park MD, MD 12/16/2020 2:34:34 PM This report has been signed electronically.

## 2020-12-16 NOTE — Progress Notes (Signed)
Report given to PACU, vss 

## 2020-12-16 NOTE — Progress Notes (Signed)
Called to room to assist during endoscopic procedure.  Patient ID and intended procedure confirmed with present staff. Received instructions for my participation in the procedure from the performing physician.  

## 2020-12-16 NOTE — Progress Notes (Signed)
1346 Robinul 0.1 mg IV given due large amount of secretions upon assessment.  MD made aware, vss

## 2020-12-16 NOTE — Patient Instructions (Signed)
Handouts given for Hiatal Hernia, polyps, diverticulosis and high fiber diet.  Await pathology results.  No aspirin, ibuprofen, naproxen or other NSAIDS.  You may take Tylenol if needed.  YOU HAD AN ENDOSCOPIC PROCEDURE TODAY AT [facility]:   Refer to the procedure report that was given to you for any specific questions about what was found during the examination.  If the procedure report does not answer your questions, please call your gastroenterologist to clarify.  If you requested that your care partner not be given the details of your procedure findings, then the procedure report has been included in a sealed envelope for you to review at your convenience later.  YOU SHOULD EXPECT: Some feelings of bloating in the abdomen. Passage of more gas than usual.  Walking can help get rid of the air that was put into your GI tract during the procedure and reduce the bloating. If you had a lower endoscopy (such as a colonoscopy or flexible sigmoidoscopy) you may notice spotting of blood in your stool or on the toilet paper. If you underwent a bowel prep for your procedure, you may not have a normal bowel movement for a few days.  Please Note:  You might notice some irritation and congestion in your nose or some drainage.  This is from the oxygen used during your procedure.  There is no need for concern and it should clear up in a day or so.  SYMPTOMS TO REPORT IMMEDIATELY:   Following lower endoscopy (colonoscopy or flexible sigmoidoscopy):  Excessive amounts of blood in the stool  Significant tenderness or worsening of abdominal pains  Swelling of the abdomen that is new, acute  Fever of 100F or higher   Following upper endoscopy (EGD)  Vomiting of blood or coffee ground material  New chest pain or pain under the shoulder blades  Painful or persistently difficult swallowing  New shortness of breath  Fever of 100F or higher  Black, tarry-looking stools  For urgent or  emergent issues, a gastroenterologist can be reached at any hour by calling [phone]. Do not use MyChart messaging for urgent concerns.    DIET:  We do recommend a small meal at first, but then you may proceed to your regular diet.  Drink plenty of fluids but you should avoid alcoholic beverages for 24 hours.  ACTIVITY:  You should plan to take it easy for the rest of today and you should NOT DRIVE or use heavy machinery until tomorrow (because of the sedation medicines used during the test).    FOLLOW UP: Our staff will call the number listed on your records 48-72 hours following your procedure to check on you and address any questions or concerns that you may have regarding the information given to you following your procedure. If we do not reach you, we will leave a message.  We will attempt to reach you two times.  During this call, we will ask if you have developed any symptoms of COVID 19. If you develop any symptoms (ie: fever, flu-like symptoms, shortness of breath, cough etc.) before then, please call [phone].  If you test positive for Covid 19 in the 2 weeks post procedure, please call and report this information to [place].    If any biopsies were taken you will be contacted by phone or by letter within the next 1-3 weeks.  Please call us at [phone] if you have not heard about the biopsies in 3 weeks.  SIGNATURES/CONFIDENTIALITY: You and/or your care partner have signed paperwork which will be entered into your electronic medical record.  These signatures attest to the fact that that the information above on your After Visit Summary has been reviewed and is understood.  Full responsibility of the confidentiality of this discharge information lies with you and/or your care-partner.

## 2020-12-16 NOTE — Op Note (Signed)
[facility] Patient Name: Pamela [name] Procedure Date: 12/16/2020 1:13 PM MRN: [id number] Endoscopist: Thornton Park MD, MD Age: 75 Referring MD:  Date of Birth: June 06, 1946 Gender: Female Account #: [id number] Procedure:                Upper GI endoscopy Indications:              Esophageal reflux symptoms that persist despite                            appropriate therapy Medicines:                Monitored Anesthesia Care Procedure:                Pre-Anesthesia Assessment:                           - Prior to the procedure, a History and Physical                            was performed, and patient medications and                            allergies were reviewed. The patient's tolerance of                            previous anesthesia was also reviewed. The risks                            and benefits of the procedure and the sedation                            options and risks were discussed with the patient.                            All questions were answered, and informed consent                            was obtained. Prior Anticoagulants: The patient has                            taken no previous anticoagulant or antiplatelet                            agents. ASA Grade Assessment: III - A patient with                            severe systemic disease. After reviewing the risks                            and benefits, the patient was deemed in                            satisfactory condition to undergo the procedure.  After obtaining informed consent, the endoscope was                            passed under direct vision. Throughout the                            procedure, the patient's blood pressure, pulse, and                            oxygen saturations were monitored continuously. The                            Endoscope was introduced through the mouth, and                            advanced to the third part of  duodenum. The upper                            GI endoscopy was accomplished without difficulty.                            The patient tolerated the procedure well. Scope In: Scope Out: Findings:                 The Z-line was regular and was found 36 cm from the                            incisors.                           Mild mucosal changes characterized by congestion                            were found at the gastroesophageal junction.                            Biopsies were taken from the mid/proximal and                            distal esophagus with a cold forceps for histology.                            Estimated blood loss was minimal.                           Diffuse mildly erythematous mucosa without bleeding                            was found in the gastric body. Biopsies were taken                            from the antrum, body, and fundus with a cold  forceps for histology. Estimated blood loss was                            minimal.                           A small hiatal hernia was present.                           The examined duodenum was normal.                           The cardia and gastric fundus were normal on                            retroflexion. Complications:            No immediate complications. Estimated blood loss:                            Minimal. Estimated Blood Loss:     Estimated blood loss was minimal. Impression:               - Z-line regular, 36 cm from the incisors.                           - Congested mucosa in the esophagus. Biopsied.                           - Erythematous mucosa in the gastric body. Biopsied.                           - Small hiatal hernia.                           - Normal examined duodenum. Recommendation:           - Patient has a contact number available for                            emergencies. The signs and symptoms of potential                            delayed  complications were discussed with the                            patient. Return to normal activities tomorrow.                            Written discharge instructions were provided to the                            patient.                           - Resume previous diet.                           -  Continue present medications.                           - No aspirin, ibuprofen, naproxen, or other                            non-steroidal anti-inflammatory drugs.                           - Await pathology results. Thornton Park MD, MD 12/16/2020 2:30:30 PM This report has been signed electronically.

## 2020-12-18 ENCOUNTER — Telehealth: Payer: Self-pay

## 2020-12-18 NOTE — Telephone Encounter (Signed)
  Follow up Call-  Call back number 12/16/2020  Post procedure Call Back phone  # [phone]  Permission to leave phone message Yes  Some recent data might be hidden     Patient questions:  Do you have a fever, pain , or abdominal swelling? No. Pain Score  0 *  Have you tolerated food without any problems? Yes.     Pt states that she had two small bites of chicken after she returned home following the procedure. She states that when she swallowed the chicken it caused pain in her chest and she threw the chicken back up. She feels like she may have just swallowed the food the wrong way. She has been able to eat soft foods like jello and potato soup since this incident and states that there is currently no pain. Patient instructed to call back as needed if pain returns or she is having problems swallowing.   Have you been able to return to your normal activities? Yes.    Do you have any questions about your discharge instructions: Diet   No. Medications  No. Follow up visit  No.  Do you have questions or concerns about your Care? No.  Actions: * If pain score is 4 or above: No action needed, pain <4.   1. Have you developed a fever since your procedure? No   2.   Have you had an respiratory symptoms (SOB or cough) since your procedure? No   3.   Have you tested positive for COVID 19 since your procedure? No   4.   Have you had any family members/close contacts diagnosed with the COVID 19 since your procedure?  No    If yes to any of these questions please route to Joylene John, RN and Joella Prince, RN

## 2020-12-25 ENCOUNTER — Other Ambulatory Visit: Payer: Self-pay

## 2020-12-25 DIAGNOSIS — A048 Other specified bacterial intestinal infections: Secondary | ICD-10-CM

## 2020-12-25 MED ORDER — PANTOPRAZOLE SODIUM 40 MG PO TBEC: 40.0000 mg | DELAYED_RELEASE_TABLET | Freq: Two times a day (BID) | ORAL | 0 refills | Status: DC

## 2020-12-25 MED ORDER — METRONIDAZOLE 500 MG PO TABS: 500.0000 mg | ORAL_TABLET | Freq: Four times a day (QID) | ORAL | 0 refills | Status: DC

## 2020-12-25 MED ORDER — BISMUTH 262 MG PO CHEW: 524.0000 mg | CHEWABLE_TABLET | Freq: Four times a day (QID) | ORAL | 0 refills | Status: DC

## 2020-12-25 MED ORDER — TETRACYCLINE HCL 500 MG PO CAPS: 500.0000 mg | ORAL_CAPSULE | Freq: Four times a day (QID) | ORAL | 0 refills | Status: DC

## 2020-12-25 MED ORDER — METRONIDAZOLE 500 MG PO TABS
500.0000 mg | ORAL_TABLET | Freq: Four times a day (QID) | ORAL | 0 refills | Status: DC
Start: 2020-12-25 — End: 2021-04-20

## 2021-01-01 ENCOUNTER — Telehealth: Payer: Self-pay | Admitting: Gastroenterology

## 2021-01-01 NOTE — Telephone Encounter (Signed)
Spoke with pt and let her know she should be finished with the antibiotics by then and she should be fine for the surgery. Suggested to pt she may want to check with her Eye surgeon to get their guidance regarding this also. Pt verbalized understanding.

## 2021-01-01 NOTE — Telephone Encounter (Signed)
Inbound call from patient requesting a call from a nurse please.  States she will be having cataract surgery in May and wants to make sure ok to have with the medications we prescribed.  Please advise.

## 2021-01-12 ENCOUNTER — Other Ambulatory Visit: Payer: Self-pay | Admitting: Cardiovascular Disease

## 2021-01-20 ENCOUNTER — Ambulatory Visit (INDEPENDENT_AMBULATORY_CARE_PROVIDER_SITE_OTHER): Payer: Medicare Other | Admitting: Podiatry

## 2021-01-20 ENCOUNTER — Encounter: Payer: Self-pay | Admitting: Podiatry

## 2021-01-20 ENCOUNTER — Other Ambulatory Visit: Payer: Self-pay

## 2021-01-20 DIAGNOSIS — M2011 Hallux valgus (acquired), right foot: Secondary | ICD-10-CM

## 2021-01-20 DIAGNOSIS — B351 Tinea unguium: Secondary | ICD-10-CM

## 2021-01-20 DIAGNOSIS — E119 Type 2 diabetes mellitus without complications: Secondary | ICD-10-CM

## 2021-01-20 DIAGNOSIS — M79674 Pain in right toe(s): Secondary | ICD-10-CM | POA: Diagnosis not present

## 2021-01-20 DIAGNOSIS — M2012 Hallux valgus (acquired), left foot: Secondary | ICD-10-CM

## 2021-01-20 DIAGNOSIS — M79675 Pain in left toe(s): Secondary | ICD-10-CM | POA: Diagnosis not present

## 2021-01-25 NOTE — Progress Notes (Addendum)
  Subjective:  Patient ID: Pamela [name], female    DOB: [birth year],  MRN: [id number]  Pamela [name] presents to clinic today for preventative diabetic foot care and painful thick toenails that are difficult to trim. Pain interferes with ambulation. Aggravating factors include wearing enclosed shoe gear. Pain is relieved with periodic professional debridement.   Patient's blood glucose was 176 mg/dl today.   Her PCP is Dr. Reynold Bowen and last visit was 11/06/2020.  Allergies  Allergen Reactions  . Demerol [Meperidine] Other (See Comments)    Unspecified   . Macrobid [Nitrofurantoin Macrocrystal] Other (See Comments)    Unspecified   . Meclizine Other (See Comments)    Unspecified   . Sulfa Antibiotics Other (See Comments)    Unspecified     Review of Systems: Negative except as noted in the HPI. Objective:   Constitutional Pamela [name] is a pleasant 75 y.o. Caucasian female, obese in NAD. AAO x 3.   Vascular Capillary refill time to digits immediate b/l. Palpable pedal pulses b/l LE. Pedal hair present. Lower extremity skin temperature gradient within normal limits. No pain with calf compression b/l. No cyanosis or clubbing noted.  Neurologic Normal speech. Oriented to person, place, and time. Protective sensation intact 5/5 intact bilaterally with 10g monofilament b/l. Vibratory sensation intact b/l.  Dermatologic Pedal skin with normal turgor, texture and tone bilaterally. No open wounds bilaterally. No interdigital macerations bilaterally. Toenails 1-5 b/l elongated, discolored, dystrophic, thickened, crumbly with subungual debris and tenderness to dorsal palpation.  Orthopedic: Normal muscle strength 5/5 to all lower extremity muscle groups bilaterally. No pain crepitus or joint limitation noted with ROM b/l. Hallux valgus with bunion deformity noted b/l lower extremities.   Radiographs: None Assessment:   1. Pain due to onychomycosis of toenails of both feet   2.  Hallux valgus, acquired, bilateral   3. Diabetes mellitus without complication ([place])    Plan:  Patient was evaluated and treated and all questions answered.  Onychomycosis with pain -Nails palliatively debridement as below -Educated on self-care  Procedure: Nail Debridement Rationale: Pain Type of Debridement: manual, sharp debridement. Instrumentation: Nail nipper, rotary burr. Number of Nails: 10 -Examined patient. -Continue diabetic foot care principles. -Patient to continue soft, supportive shoe gear daily. -Toenails 1-5 b/l were debrided in length and girth with sterile nail nippers and dremel without iatrogenic bleeding.  -Patient to report any pedal injuries to medical professional immediately. -Patient/POA to call should there be question/concern in the interim.  Return in about 9 weeks (around 03/24/2021).  Marzetta Board, DPM

## 2021-02-06 ENCOUNTER — Other Ambulatory Visit: Payer: Self-pay

## 2021-02-06 ENCOUNTER — Ambulatory Visit (INDEPENDENT_AMBULATORY_CARE_PROVIDER_SITE_OTHER): Payer: Medicare Other

## 2021-02-06 DIAGNOSIS — M2012 Hallux valgus (acquired), left foot: Secondary | ICD-10-CM

## 2021-02-06 DIAGNOSIS — M2011 Hallux valgus (acquired), right foot: Secondary | ICD-10-CM

## 2021-02-11 DIAGNOSIS — H25811 Combined forms of age-related cataract, right eye: Secondary | ICD-10-CM | POA: Diagnosis not present

## 2021-02-11 DIAGNOSIS — H2511 Age-related nuclear cataract, right eye: Secondary | ICD-10-CM | POA: Diagnosis not present

## 2021-02-11 DIAGNOSIS — H25041 Posterior subcapsular polar age-related cataract, right eye: Secondary | ICD-10-CM | POA: Diagnosis not present

## 2021-02-17 DIAGNOSIS — H2512 Age-related nuclear cataract, left eye: Secondary | ICD-10-CM | POA: Diagnosis not present

## 2021-02-17 DIAGNOSIS — H25012 Cortical age-related cataract, left eye: Secondary | ICD-10-CM | POA: Diagnosis not present

## 2021-02-20 NOTE — Progress Notes (Signed)
Patient seen by EJ with [facility] for orthotic adjustments today. Advised patient to call the office with any addition concerns related to the orthotics.

## 2021-03-10 DIAGNOSIS — I10 Essential (primary) hypertension: Secondary | ICD-10-CM | POA: Diagnosis not present

## 2021-03-10 DIAGNOSIS — I129 Hypertensive chronic kidney disease with stage 1 through stage 4 chronic kidney disease, or unspecified chronic kidney disease: Secondary | ICD-10-CM | POA: Diagnosis not present

## 2021-03-10 DIAGNOSIS — H43821 Vitreomacular adhesion, right eye: Secondary | ICD-10-CM | POA: Diagnosis not present

## 2021-03-10 DIAGNOSIS — E669 Obesity, unspecified: Secondary | ICD-10-CM | POA: Diagnosis not present

## 2021-03-10 DIAGNOSIS — E1142 Type 2 diabetes mellitus with diabetic polyneuropathy: Secondary | ICD-10-CM | POA: Diagnosis not present

## 2021-03-10 DIAGNOSIS — I5189 Other ill-defined heart diseases: Secondary | ICD-10-CM | POA: Diagnosis not present

## 2021-03-10 DIAGNOSIS — E039 Hypothyroidism, unspecified: Secondary | ICD-10-CM | POA: Diagnosis not present

## 2021-03-10 DIAGNOSIS — N1831 Chronic kidney disease, stage 3a: Secondary | ICD-10-CM | POA: Diagnosis not present

## 2021-03-10 DIAGNOSIS — F411 Generalized anxiety disorder: Secondary | ICD-10-CM | POA: Diagnosis not present

## 2021-03-10 DIAGNOSIS — D126 Benign neoplasm of colon, unspecified: Secondary | ICD-10-CM | POA: Diagnosis not present

## 2021-03-10 DIAGNOSIS — R269 Unspecified abnormalities of gait and mobility: Secondary | ICD-10-CM | POA: Diagnosis not present

## 2021-03-10 DIAGNOSIS — E785 Hyperlipidemia, unspecified: Secondary | ICD-10-CM | POA: Diagnosis not present

## 2021-03-19 ENCOUNTER — Telehealth: Payer: Self-pay | Admitting: Gastroenterology

## 2021-03-19 NOTE — Telephone Encounter (Signed)
Pt will be doing the stool antigen test and wanted to know what she could take for reflux. Discussed with her she can take tums or maalox, no protonix. She verbalized understanding.

## 2021-03-24 ENCOUNTER — Other Ambulatory Visit: Payer: Self-pay

## 2021-03-24 ENCOUNTER — Ambulatory Visit (INDEPENDENT_AMBULATORY_CARE_PROVIDER_SITE_OTHER): Payer: Medicare Other | Admitting: Podiatry

## 2021-03-24 ENCOUNTER — Encounter: Payer: Self-pay | Admitting: Podiatry

## 2021-03-24 DIAGNOSIS — M79674 Pain in right toe(s): Secondary | ICD-10-CM

## 2021-03-24 DIAGNOSIS — M79675 Pain in left toe(s): Secondary | ICD-10-CM

## 2021-03-24 DIAGNOSIS — B351 Tinea unguium: Secondary | ICD-10-CM

## 2021-03-27 ENCOUNTER — Ambulatory Visit: Payer: Medicare Other | Admitting: Gastroenterology

## 2021-03-27 NOTE — Progress Notes (Signed)
Subjective: Pamela [name] is a pleasant 75 y.o. female patient seen today painful thick toenails that are difficult to trim. Pain interferes with ambulation. Aggravating factors include wearing enclosed shoe gear. Pain is relieved with periodic professional debridement.  Patient is diabetic and last blood glucose was 110 mg/dl today.  PCP is Reynold Bowen, MD. Last visit was: two weeks ago per patient recall.  Allergies  Allergen Reactions   Demerol [Meperidine] Other (See Comments)    Unspecified    Macrobid [Nitrofurantoin Macrocrystal] Other (See Comments)    Unspecified    Meclizine Other (See Comments)    Unspecified    Sulfa Antibiotics Other (See Comments)    Unspecified    Objective: Physical Exam  General: Pamela [name] is a pleasant 75 y.o. Caucasian female, in NAD. AAO x 3.   Vascular:  Capillary refill time to digits immediate b/l. Palpable pedal pulses b/l LE. Pedal hair present. Lower extremity skin temperature gradient within normal limits. No pain with calf compression b/l. No edema noted b/l lower extremities.  Dermatological:  Pedal skin with normal turgor, texture and tone b/l lower extremities Toenails 1-5 b/l elongated, discolored, dystrophic, thickened, crumbly with subungual debris and tenderness to dorsal palpation. Palpable raised soft tissue mass along extensor tendon right great toe which waxes and wanes. Non-pulsatile, though appears to have venous component.  Musculoskeletal:  Normal muscle strength 5/5 to all lower extremity muscle groups bilaterally. No pain crepitus or joint limitation noted with ROM b/l. Hallux valgus with bunion deformity noted b/l lower extremities.  Neurological:  Protective sensation intact 5/5 intact bilaterally with 10g monofilament b/l. Vibratory sensation intact b/l.  Assessment and Plan:  1. Pain due to onychomycosis of toenails of both feet     -Examined patient. -Patient to continue soft, supportive shoe gear  daily. -Toenails 1-5 b/l were debrided in length and girth with sterile nail nippers and dremel without iatrogenic bleeding.  -Patient to report any pedal injuries to medical professional immediately. -Patient/POA to call should there be question/concern in the interim.  Return in about 9 weeks (around 05/26/2021).  Marzetta Board, DPM

## 2021-04-06 ENCOUNTER — Other Ambulatory Visit: Payer: Medicare Other

## 2021-04-06 DIAGNOSIS — A048 Other specified bacterial intestinal infections: Secondary | ICD-10-CM

## 2021-04-07 LAB — HELICOBACTER PYLORI  SPECIAL ANTIGEN
MICRO NUMBER:: 12158553
SPECIMEN QUALITY: ADEQUATE

## 2021-04-16 ENCOUNTER — Encounter (INDEPENDENT_AMBULATORY_CARE_PROVIDER_SITE_OTHER): Payer: Medicare Other | Admitting: Ophthalmology

## 2021-04-20 ENCOUNTER — Ambulatory Visit (INDEPENDENT_AMBULATORY_CARE_PROVIDER_SITE_OTHER): Payer: Medicare Other | Admitting: Gastroenterology

## 2021-04-20 ENCOUNTER — Encounter: Payer: Self-pay | Admitting: Gastroenterology

## 2021-04-20 VITALS — BP 130/60 | HR 64 | Ht 64.5 in | Wt 211.5 lb

## 2021-04-20 DIAGNOSIS — K219 Gastro-esophageal reflux disease without esophagitis: Secondary | ICD-10-CM

## 2021-04-20 DIAGNOSIS — B9681 Helicobacter pylori [H. pylori] as the cause of diseases classified elsewhere: Secondary | ICD-10-CM | POA: Diagnosis not present

## 2021-04-20 DIAGNOSIS — K2970 Gastritis, unspecified, without bleeding: Secondary | ICD-10-CM

## 2021-04-20 NOTE — Progress Notes (Signed)
Referring Provider: Reynold Bowen, MD Primary Care Physician:  Reynold Bowen, MD  Chief complaints:  abdominal pain and diarrhea   IMPRESSION:  H pylori gastritis with follow-up testing documenting resolution Alternating diarrhea and constipation Reflux with symptoms despite famotidine    - esophageal biopsies negative for EOE History of colon polyps    - 2 tubular adenomas 2015    - 1 tubular adenoma 2022    - surveillance colonoscopy recommended in 7 years Recent 24 pound weight loss that she attributes to adaptation of high fiber diet Diverticulosis   PLAN: - Continue high fiber diet, consider addition of Metamucil - Resume pantoprazole 40 mg BID - Add Pepcid if symptoms are not improving - She will call with symptom update in 2 weeks - Consider trial of dicyclomine if not improving - Low threshold for contrasted CT scan abd/pelvis if weight loss continues - Follow-up in 2-3 months, earlier as needed  HPI: Pamela [name] is a 75 y.o. female self-referred for reflux. She was initially seen 10/24/20 and had endoscopy performed 12/16/20. The interval history is obtained through the patient and review of her electronic health record.  She has obesity, hypertension, type 2 diabetes mellitus, and hyperlipidemia. Accompanied to this appointment by her husband, who contributes to the history.   EGD performed for indigestion and reflux symptoms despite appropriate therapy revealed an irregular Z-line with biopsies confirming reflux and H. pylori gastritis.  Esophageal biopsies were negative for eosinophilic esophagitis.   Surveillance colonoscopy revealed left-sided diverticulosis and a small descending colon tubular adenoma.  She returns in follow-up having completed antibiotic therapy for H. Pylori with Flagyl, tetracycline, bismuth, and pantoprazole. She felt better while on treatment but, feels like things were never better.  Follow-up H. pylori stool antigen was negative for H.  pylori 04/20/2021. She has been off the pantoprazole since the treatment. She has been using Maalax and TUMS since that time.   Today she reports ongoing LUQ pain, abdominal swelling, and recent diarrhea. She is having postprandial diarrhea but also experiences some constipation.  She has lost 24 pounds since she adopted a high fiber diet. She also notes feeling extra sleepy after meals.   Endoscopic history: - Colonoscopy 2005: large IC valve but no polyps - Colonoscopy 2015: pancolonic diverticulosis and 2 tubular adenomas - Colonoscopy 12/16/20: diverticulosis, 47m descending colon tubular adenoma  Past Medical History:  Diagnosis Date   Arthritis    Cataract    Colon polyps    Diabetes mellitus ([place]    Gallstones    GERD (gastroesophageal reflux disease)    Hearing loss    right ear   Heart murmur    Hyperlipidemia    Hypertension    Hypothyroidism    Tachycardia     Past Surgical History:  Procedure Laterality Date   ABDOMINAL HYSTERECTOMY     x 2   APPENDECTOMY     CHOLECYSTECTOMY     with appendectomy   COLONOSCOPY  12/16/2020   TONSILLECTOMY      Current Outpatient Medications  Medication Sig Dispense Refill   aspirin 325 MG tablet Take 325 mg by mouth daily.     Bismuth 262 MG CHEW Chew 524 mg by mouth in the morning, at noon, in the evening, and at bedtime. 112 tablet 0   carvedilol (COREG) 6.25 MG tablet TAKE ONE AND ONE-HALF IN THE AM AND ONE TABLET IN THE PM 270 tablet 1   cholecalciferol (VITAMIN D) 25 MCG (1000 UNIT) tablet  Coenzyme Q10 (CO Q 10) 100 MG CAPS Take 200 mg by mouth daily.     empagliflozin (JARDIANCE) 10 MG TABS tablet Take 5 mg by mouth daily. '5mg'$  in the morning     ezetimibe (ZETIA) 10 MG tablet TAKE 1 TABLET DAILY 90 tablet 3   famotidine (PEPCID) 20 MG tablet Take 20 mg by mouth at bedtime.     fluorometholone (FML) 0.1 % ophthalmic suspension      glimepiride (AMARYL) 2 MG tablet Take 2 mg by mouth daily before breakfast.      hydrochlorothiazide (HYDRODIURIL) 25 MG tablet TAKE 1 TABLET DAILY 90 tablet 3   levothyroxine (SYNTHROID, LEVOTHROID) 137 MCG tablet Take 137 mcg by mouth daily before breakfast. One daily, 1 and 1/2 on Sunday     linaGLIPtin-metFORMIN HCl ER 2.01-999 MG TB24 Take 2 tablets by mouth every morning.      lisinopril (PRINIVIL,ZESTRIL) 40 MG tablet Take 40 mg by mouth daily.     methocarbamol (ROBAXIN) 500 MG tablet  (Patient not taking: Reported on 12/16/2020)     metroNIDAZOLE (FLAGYL) 500 MG tablet Take 1 tablet (500 mg total) by mouth 4 (four) times daily. 56 tablet 0   metroNIDAZOLE (METROCREAM) 0.75 % cream      pantoprazole (PROTONIX) 40 MG tablet TAKE 1 TABLET(40 MG) BY MOUTH DAILY (Patient not taking: Reported on 12/16/2020) 90 tablet 3   pantoprazole (PROTONIX) 40 MG tablet Take 1 tablet (40 mg total) by mouth 2 (two) times daily. 28 tablet 0   tetracycline (SUMYCIN) 500 MG capsule Take 1 capsule (500 mg total) by mouth 4 (four) times daily. 54 capsule 0   No current facility-administered medications for this visit.    Allergies as of 04/20/2021 - Review Complete 03/24/2021  Allergen Reaction Noted   Demerol [meperidine] Other (See Comments) 12/07/2012   Macrobid [nitrofurantoin macrocrystal] Other (See Comments) 12/07/2012   Meclizine Other (See Comments) 12/07/2012   Sulfa antibiotics Other (See Comments) 12/07/2012    Family History  Problem Relation Age of Onset   Hypertension Mother        also cerebral hemorrhage   Cerebral aneurysm Mother        also cerebral hemorrhage   Hypertension Father    Diabetes Father        also Alzhemiers   Alzheimer's disease Father    Breast cancer Paternal Grandmother    CAD Maternal Grandfather        Hardening of the arteries   Cerebral aneurysm Maternal Grandfather    Hypertension Sister    Anxiety disorder Sister    Hypertension Son    Colon cancer Neg Hx    Esophageal cancer Neg Hx    Stomach cancer Neg Hx    Rectal cancer Neg Hx     Colon polyps Neg Hx       Physical Exam: General:   Alert,  well-nourished, pleasant and cooperative in NAD Head:  Normocephalic and atraumatic. Eyes:  Sclera clear, no icterus.   Conjunctiva pink. Abdomen:  Soft, nontender, nondistended, normal bowel sounds, no rebound or guarding. No hepatosplenomegaly.   Neurologic:  Alert and  oriented x4;  grossly nonfocal Skin:  Intact without significant lesions or rashes. Psych:  Alert and cooperative. Normal mood and affect.    Soloman Mckeithan L. Tarri Glenn, MD, MPH 04/20/2021, 8:54 AM

## 2021-04-20 NOTE — Patient Instructions (Addendum)
I recommend that you restart your pantoprazole 40 mg twice daily. You can add the Pepcid if needed.  Please call me with an update in how you are feeling in about two weeks.  I would like to see you back in the office in 2-3 months. We scheduled your follow up visit on 06/22/21 at 8:50am.  I would recommend a CT scan if you continue to lose weight without trying.

## 2021-05-07 ENCOUNTER — Telehealth: Payer: Self-pay | Admitting: Gastroenterology

## 2021-05-07 NOTE — Telephone Encounter (Signed)
Pt called to inform that she has been taking med as instructed twice a day and has not had the necessity to take pepsic. She still has a little bit of problems with her stomach like felling of acid refluc but it is not often at all. She reported ocassional problems with diarrhea and stomach discomfort but overall pt feels better. Pt has also been controlling her diet more carefully.

## 2021-05-07 NOTE — Telephone Encounter (Signed)
Left message for pt to call back  °

## 2021-05-08 ENCOUNTER — Other Ambulatory Visit: Payer: Self-pay

## 2021-05-08 MED ORDER — PANTOPRAZOLE SODIUM 40 MG PO TBEC: 40.0000 mg | DELAYED_RELEASE_TABLET | Freq: Two times a day (BID) | ORAL | 3 refills | Status: DC

## 2021-05-08 NOTE — Telephone Encounter (Signed)
Pt called to give update stating that she is feeling better. Prescription sent to pharmacy. Pt informed to take protonix twice a day.

## 2021-05-21 ENCOUNTER — Ambulatory Visit (INDEPENDENT_AMBULATORY_CARE_PROVIDER_SITE_OTHER): Payer: Medicare Other | Admitting: Ophthalmology

## 2021-05-21 ENCOUNTER — Other Ambulatory Visit: Payer: Self-pay

## 2021-05-21 ENCOUNTER — Encounter (INDEPENDENT_AMBULATORY_CARE_PROVIDER_SITE_OTHER): Payer: Self-pay | Admitting: Ophthalmology

## 2021-05-21 DIAGNOSIS — H2511 Age-related nuclear cataract, right eye: Secondary | ICD-10-CM | POA: Diagnosis not present

## 2021-05-21 DIAGNOSIS — Z961 Presence of intraocular lens: Secondary | ICD-10-CM | POA: Diagnosis not present

## 2021-05-21 DIAGNOSIS — H43821 Vitreomacular adhesion, right eye: Secondary | ICD-10-CM

## 2021-05-21 DIAGNOSIS — H43822 Vitreomacular adhesion, left eye: Secondary | ICD-10-CM | POA: Diagnosis not present

## 2021-05-21 DIAGNOSIS — H2512 Age-related nuclear cataract, left eye: Secondary | ICD-10-CM | POA: Diagnosis not present

## 2021-05-21 DIAGNOSIS — E119 Type 2 diabetes mellitus without complications: Secondary | ICD-10-CM | POA: Diagnosis not present

## 2021-05-21 NOTE — Assessment & Plan Note (Signed)
IOL well centered. 

## 2021-05-21 NOTE — Assessment & Plan Note (Signed)
No detectable diabetic retinopathy 

## 2021-05-21 NOTE — Assessment & Plan Note (Signed)
Follow-up OS as per Dr. Kathlen Mody

## 2021-05-21 NOTE — Progress Notes (Signed)
05/21/2021     CHIEF COMPLAINT Patient presents for  Chief Complaint  Patient presents with   Retina Follow Up      HISTORY OF PRESENT ILLNESS: Pamela [name] is a 75 y.o. female who presents to the clinic today for:   HPI     Retina Follow Up   Patient presents with  Diabetic Retinopathy.  In both eyes.  This started 1 year ago.  Severity is mild.  Duration of 1 year.  Since onset it is stable.        Comments   1 yr fu ou oct fp. Patient states vision is stable and unchanged since last visit. Denies any new floaters or FOL. Pt states she had cataract surgery on her right eye, February 11, 2021.       Last edited by Laurin Coder on 05/21/2021  2:17 PM.      Referring physician: Hortencia Pilar, MD [place],  [place] [address]  HISTORICAL INFORMATION:   Selected notes from the MEDICAL RECORD NUMBER    Lab Results  Component Value Date   HGBA1C (H) 05/31/2009    7.7 (NOTE) The ADA recommends the following therapeutic goal for glycemic control related to Hgb A1c measurement: Goal of therapy: <6.5 Hgb A1c  Reference: American Diabetes Association: Clinical Practice Recommendations 2010, Diabetes Care, 2010, 33: (Suppl  1).     CURRENT MEDICATIONS: Current Outpatient Medications (Ophthalmic Drugs)  Medication Sig   Polyvinyl Alcohol-Povidone (REFRESH OP) Apply 1 drop to eye 2 (two) times daily.   No current facility-administered medications for this visit. (Ophthalmic Drugs)   Current Outpatient Medications (Other)  Medication Sig   aspirin 325 MG tablet Take 325 mg by mouth daily.   calcium carbonate (TUMS EX) 750 MG chewable tablet Chew 1 tablet by mouth as needed for heartburn.   Calcium Carbonate Antacid (MAALOX PO) Take 1 Dose by mouth 2 (two) times daily.   carvedilol (COREG) 6.25 MG tablet TAKE ONE AND ONE-HALF IN THE AM AND ONE TABLET IN THE PM   cholecalciferol (VITAMIN D) 25 MCG (1000 UNIT) tablet    Coenzyme Q10 (CO Q 10)  100 MG CAPS Take 200 mg by mouth daily.   empagliflozin (JARDIANCE) 10 MG TABS tablet Take 5 mg by mouth daily. '5mg'$  in the morning   ezetimibe (ZETIA) 10 MG tablet TAKE 1 TABLET DAILY   famotidine (PEPCID) 20 MG tablet Take 20 mg by mouth at bedtime. (Patient not taking: Reported on 04/20/2021)   glimepiride (AMARYL) 2 MG tablet Take 2 mg by mouth daily before breakfast.   hydrochlorothiazide (HYDRODIURIL) 25 MG tablet TAKE 1 TABLET DAILY   levothyroxine (SYNTHROID, LEVOTHROID) 137 MCG tablet Take 137 mcg by mouth daily before breakfast. One daily, 1 and 1/2 on Sunday   linaGLIPtin-metFORMIN HCl ER 2.01-999 MG TB24 Take 2 tablets by mouth every morning.    lisinopril (PRINIVIL,ZESTRIL) 40 MG tablet Take 40 mg by mouth daily.   metroNIDAZOLE (METROCREAM) 0.75 % cream Apply 1 application topically daily.   pantoprazole (PROTONIX) 40 MG tablet Take 1 tablet (40 mg total) by mouth 2 (two) times daily.   No current facility-administered medications for this visit. (Other)      REVIEW OF SYSTEMS:    ALLERGIES Allergies  Allergen Reactions   Demerol [Meperidine] Other (See Comments)    Unspecified    Macrobid [Nitrofurantoin Macrocrystal] Other (See Comments)    Unspecified    Macrobid [Nitrofurantoin]  Other reaction(s): hearing loss in R ear   Meclizine Other (See Comments)    Unspecified    Sulfa Antibiotics Other (See Comments)    Unspecified     PAST MEDICAL HISTORY Past Medical History:  Diagnosis Date   Age-related nuclear cataract of right eye 04/15/2020   Arthritis    Cataract    Colon polyps    Diabetes mellitus ([place])    Gallstones    GERD (gastroesophageal reflux disease)    Hearing loss    right ear   Heart murmur    Hyperlipidemia    Hypertension    Hypothyroidism    Tachycardia    Vitreomacular adhesion of right eye 04/15/2020   Past Surgical History:  Procedure Laterality Date   ABDOMINAL HYSTERECTOMY     x 2   APPENDECTOMY     CHOLECYSTECTOMY      with appendectomy   COLONOSCOPY  12/16/2020   TONSILLECTOMY      FAMILY HISTORY Family History  Problem Relation Age of Onset   Hypertension Mother        also cerebral hemorrhage   Cerebral aneurysm Mother        also cerebral hemorrhage   Hypertension Father    Diabetes Father        also Alzhemiers   Alzheimer's disease Father    Breast cancer Paternal Grandmother    CAD Maternal Grandfather        Hardening of the arteries   Cerebral aneurysm Maternal Grandfather    Hypertension Sister    Anxiety disorder Sister    Hypertension Son    Colon cancer Neg Hx    Esophageal cancer Neg Hx    Stomach cancer Neg Hx    Rectal cancer Neg Hx    Colon polyps Neg Hx     SOCIAL HISTORY Social History   Tobacco Use   Smoking status: Never   Smokeless tobacco: Never  Vaping Use   Vaping Use: Never used  Substance Use Topics   Alcohol use: No   Drug use: No         OPHTHALMIC EXAM:  Base Eye Exam     Visual Acuity (ETDRS)       Right Left   Dist [place] 20/25 20/20  Pt states on her right eye "it goes in and out."        Tonometry (Tonopen, 2:21 PM)       Right Left   Pressure 15 15         Pupils       Pupils Dark Light APD   Right PERRL 4 3 None   Left PERRL 4 3 None         Visual Fields (Counting fingers)       Left Right    Full Full         Extraocular Movement       Right Left    Full Full         Neuro/Psych     Oriented x3: Yes   Mood/Affect: Normal         Dilation     Both eyes: 1.0% Mydriacyl, 2.5% Phenylephrine @ 2:21 PM           Slit Lamp and Fundus Exam     External Exam       Right Left   External Normal Normal         Slit Lamp Exam       Right  Left   Lids/Lashes Normal Normal   Conjunctiva/Sclera White and quiet White and quiet   Cornea Clear Clear   Anterior Chamber Deep and quiet Deep and quiet   Iris Round and reactive Round and reactive   Lens Centered posterior chamber intraocular lens  2+ Nuclear sclerosis   Anterior Vitreous Normal Normal         Fundus Exam       Right Left   Posterior Vitreous Posterior vitreous detachment Posterior vitreous detachment   Disc Normal Normal   C/D Ratio 0.35 0.35   Macula Normal Normal   Vessels no DR no DR   Periphery Normal Normal            IMAGING AND PROCEDURES  Imaging and Procedures for 05/21/21  OCT, Retina - OU - Both Eyes       Right Eye Quality was good. Scan locations included subfoveal. Central Foveal Thickness: 294. Findings include normal foveal contour.   Left Eye Quality was good. Scan locations included subfoveal. Central Foveal Thickness: 297. Findings include vitreomacular adhesion , normal foveal contour.   Notes Incidental posterior vitreous detachment now present OD, minor vitreomacular adhesion OS with no foveal distortion observed both     Color Fundus Photography Optos - OU - Both Eyes       Right Eye Progression has no prior data. Macula : normal observations. Vessels : normal observations. Periphery : normal observations.   Left Eye Progression has no prior data. Disc findings include normal observations. Macula : normal observations. Vessels : normal observations. Periphery : normal observations.   Notes No detectable diabetic retinopathy clinically or on her fundus photography             ASSESSMENT/PLAN:  Pseudophakia of right eye IOL well centered  Diabetes mellitus without complication (HCC) No detectable diabetic retinopathy  Age-related nuclear cataract of left eye Follow-up OS as per Dr. Kathlen Mody     ICD-10-CM   1. Age-related nuclear cataract of right eye  H25.11     2. Vitreomacular adhesion of left eye  H43.822 OCT, Retina - OU - Both Eyes    Color Fundus Photography Optos - OU - Both Eyes    3. Vitreomacular adhesion of right eye  H43.821 OCT, Retina - OU - Both Eyes    Color Fundus Photography Optos - OU - Both Eyes    4. Pseudophakia of right eye   Z96.1     5. Diabetes mellitus without complication (HCC)  [id number]     6. Age-related nuclear cataract of left eye  H25.12       1.  Looks great OU.  Pseudophakia OD, with well centered IOL.  Distant cataract OS patient instructed to monitor on a monocular basis so she can teach her self the impact of that cataract cloudy, and color change compared to the clear right eye  2.  No detectable diabetic retinopathy OU  3.  Ophthalmic Meds Ordered this visit:  No orders of the defined types were placed in this encounter.      Return in about 1 year (around 05/21/2022) for DILATE OU.  There are no Patient Instructions on file for this visit.   Explained the diagnoses, plan, and follow up with the patient and they expressed understanding.  Patient expressed understanding of the importance of proper follow up care.   Clent Demark Bronnie Vasseur M.D. Diseases & Surgery of the Retina and Vitreous [facility] [place] 05/21/21     Abbreviations:  M myopia (nearsighted); A astigmatism; H hyperopia (farsighted); P presbyopia; Mrx spectacle prescription;  CTL contact lenses; OD right eye; OS left eye; OU both eyes  XT exotropia; ET esotropia; PEK punctate epithelial keratitis; PEE punctate epithelial erosions; DES dry eye syndrome; MGD meibomian gland dysfunction; ATs artificial tears; PFAT's preservative free artificial tears; [place] nuclear sclerotic cataract; PSC posterior subcapsular cataract; ERM epi-retinal membrane; PVD posterior vitreous detachment; RD retinal detachment; DM diabetes mellitus; DR diabetic retinopathy; NPDR non-proliferative diabetic retinopathy; PDR proliferative diabetic retinopathy; CSME clinically significant macular edema; DME diabetic macular edema; dbh dot blot hemorrhages; CWS cotton wool spot; POAG primary open angle glaucoma; C/D cup-to-disc ratio; HVF humphrey visual field; GVF goldmann visual field; OCT optical coherence tomography; IOP intraocular pressure; BRVO Branch  retinal vein occlusion; CRVO central retinal vein occlusion; CRAO central retinal artery occlusion; BRAO branch retinal artery occlusion; RT retinal tear; SB scleral buckle; PPV pars plana vitrectomy; VH Vitreous hemorrhage; PRP panretinal laser photocoagulation; IVK intravitreal kenalog; VMT vitreomacular traction; MH Macular hole;  NVD neovascularization of the disc; NVE neovascularization elsewhere; AREDS age related eye disease study; ARMD age related macular degeneration; POAG primary open angle glaucoma; EBMD epithelial/anterior basement membrane dystrophy; ACIOL anterior chamber intraocular lens; IOL intraocular lens; PCIOL posterior chamber intraocular lens; Phaco/IOL phacoemulsification with intraocular lens placement; [place] photorefractive keratectomy; LASIK laser assisted in situ keratomileusis; HTN hypertension; DM diabetes mellitus; COPD chronic obstructive pulmonary disease

## 2021-05-26 ENCOUNTER — Other Ambulatory Visit: Payer: Self-pay

## 2021-05-26 ENCOUNTER — Ambulatory Visit (INDEPENDENT_AMBULATORY_CARE_PROVIDER_SITE_OTHER): Payer: Medicare Other | Admitting: Podiatry

## 2021-05-26 DIAGNOSIS — K219 Gastro-esophageal reflux disease without esophagitis: Secondary | ICD-10-CM | POA: Insufficient documentation

## 2021-05-26 DIAGNOSIS — I5189 Other ill-defined heart diseases: Secondary | ICD-10-CM | POA: Insufficient documentation

## 2021-05-26 DIAGNOSIS — M79674 Pain in right toe(s): Secondary | ICD-10-CM

## 2021-05-26 DIAGNOSIS — M79675 Pain in left toe(s): Secondary | ICD-10-CM

## 2021-05-26 DIAGNOSIS — E119 Type 2 diabetes mellitus without complications: Secondary | ICD-10-CM | POA: Diagnosis not present

## 2021-05-26 DIAGNOSIS — B351 Tinea unguium: Secondary | ICD-10-CM | POA: Diagnosis not present

## 2021-05-31 ENCOUNTER — Encounter: Payer: Self-pay | Admitting: Podiatry

## 2021-05-31 NOTE — Progress Notes (Signed)
  Subjective:  Patient ID: Pamela [name], female    DOB: Aug 11, 1946,  MRN: [id number]  75 y.o. female presents with thick, elongated toenails b/l feet which are tender when wearing enclosed shoe gear..    Patient did not check blood glucose this morning.  PCP: Reynold Bowen, MD and last visit was: 11/06/2020  Review of Systems: Negative except as noted in the HPI.   Allergies  Allergen Reactions   Demerol [Meperidine] Other (See Comments)    Unspecified    Macrobid [Nitrofurantoin Macrocrystal] Other (See Comments)    Unspecified    Macrobid [Nitrofurantoin]     Other reaction(s): hearing loss in R ear   Meclizine Other (See Comments)    Unspecified    Sulfa Antibiotics Other (See Comments)    Unspecified     Objective:  There were no vitals filed for this visit. Constitutional Patient is a pleasant 75 y.o. Caucasian female in NAD. AAO x 3.  Vascular Capillary fill time to digits immediate b/l.  DP/PT pulse(s) are palpable b/l lower extremities. Pedal hair present. Lower extremity skin temperature gradient within normal limits. No pain with calf compression b/l. No edema noted b/l lower extremities. No cyanosis or clubbing noted.   Neurologic Protective sensation intact 5/5 intact bilaterally with 10g monofilament b/l. Vibratory sensation intact b/l. No clonus b/l.   Dermatologic Pedal skin is warm and supple b/l.  No open wounds b/l lower extremities. No interdigital macerations b/l lower extremities. Toenails 1-5 b/l elongated, discolored, dystrophic, thickened, crumbly with subungual debris and tenderness to dorsal palpation. Nonpulsatile STM noted dorsal extensor tendon of right great toe and is venous in nature.  Orthopedic: Normal muscle strength 5/5 to all lower extremity muscle groups bilaterally. Patient ambulates independent of any assistive aids. Hallux valgus with bunion deformity noted b/l lower extremities.    Assessment:   1. Pain due to onychomycosis of  toenails of both feet   2. Diabetes mellitus without complication ([place])    Plan:  Patient was evaluated and treated and all questions answered. Consent given for treatment as described below: -No new findings. No new orders. -Continue diabetic foot care principles: inspect feet daily, monitor glucose as recommended by PCP and/or Endocrinologist, and follow prescribed diet per PCP, Endocrinologist and/or dietician. -Patient to continue soft, supportive shoe gear daily. -Toenails 1-5 b/l were debrided in length and girth with sterile nail nippers and dremel without iatrogenic bleeding.  -Patient to report any pedal injuries to medical professional immediately. -Patient/POA to call should there be question/concern in the interim.  Return in about 9 weeks (around 07/28/2021).  Marzetta Board, DPM

## 2021-06-05 ENCOUNTER — Other Ambulatory Visit: Payer: Medicare Other

## 2021-06-15 ENCOUNTER — Other Ambulatory Visit: Payer: Self-pay | Admitting: Endocrinology

## 2021-06-15 DIAGNOSIS — Z1231 Encounter for screening mammogram for malignant neoplasm of breast: Secondary | ICD-10-CM

## 2021-06-22 ENCOUNTER — Ambulatory Visit (INDEPENDENT_AMBULATORY_CARE_PROVIDER_SITE_OTHER): Payer: Medicare Other | Admitting: Gastroenterology

## 2021-06-22 ENCOUNTER — Ambulatory Visit: Payer: Medicare Other | Admitting: Gastroenterology

## 2021-06-22 ENCOUNTER — Encounter: Payer: Self-pay | Admitting: Gastroenterology

## 2021-06-22 VITALS — BP 150/50 | HR 60 | Ht 66.0 in | Wt 212.5 lb

## 2021-06-22 DIAGNOSIS — K219 Gastro-esophageal reflux disease without esophagitis: Secondary | ICD-10-CM | POA: Diagnosis not present

## 2021-06-22 DIAGNOSIS — K529 Noninfective gastroenteritis and colitis, unspecified: Secondary | ICD-10-CM | POA: Diagnosis not present

## 2021-06-22 MED ORDER — PANTOPRAZOLE SODIUM 40 MG PO TBEC: 40.0000 mg | DELAYED_RELEASE_TABLET | Freq: Two times a day (BID) | ORAL | 3 refills | Status: DC

## 2021-06-22 MED ORDER — DICYCLOMINE HCL 10 MG PO CAPS: 10.0000 mg | ORAL_CAPSULE | Freq: Three times a day (TID) | ORAL | 0 refills | Status: DC

## 2021-06-22 NOTE — Patient Instructions (Addendum)
Continue to take your pantoprazole 40 mg twice daily. I recommend that you use the Pepcid twice daily as needed for any heart burn symptoms. You can use this in addition to the pantoprazole.  Instead of yogurt, you may want to consider use a probiotic such as Align and Culturelle.   Let's see if using dicyclomine 10 mg four times daily helps with the diarrhea that happens after you eat. Take this medicine prior to meals and large snacks to see if it helps.  Please talk to Dr. Forde Dandy about your blood pressure today and your frequent nighttime urination.   I'd like to see you in 2-3 months, or earlier if necessary. Please let me know if you need anything prior to that time.

## 2021-06-22 NOTE — Progress Notes (Signed)
Referring Provider: Reynold Bowen, MD Primary Care Physician:  Reynold Bowen, MD  Chief complaint:  abdominal pain, reflux and diarrhea   IMPRESSION:  History of H pylori gastritis    -  follow-up testing documenting resolution Alternating diarrhea and constipation Reflux with symptoms despite famotidine    - symptoms improving on pantoprazole 40 mg BID    - now using famotidine PRN    - esophageal biopsies negative for EOE History of colon polyps    - 2 tubular adenomas 2015    - 1 tubular adenoma 2022    - surveillance colonoscopy recommended in 7 years Recent 24 pound weight loss     - she attributes to adaptation of high fiber diet    - weight stable since her last office visit Diverticulosis without history of diverticulitis   PLAN: - Continue high fiber diet, consider addition of Metamucil - Continue pantoprazole 40 mg BID - Continue to use Pepcid 20 mg BID, she has been using it PRN and she may take it more regularly - Trial of dicyclomine taken 10 mg QID prn with instructions to take prior to meals - Low threshold for contrasted CT scan abd/pelvis if there is further weight loss - Touch base with Dr. Forde Dandy re: hypertension noted today, concerns about nocturia - Follow-up in 2-3 months, earlier as needed  HPI: JERELENE SALAAM is a 75 y.o. female who returns in follow-up for reflux. The interval history is obtained through the patient and review of her electronic health record.  She has obesity, hypertension, type 2 diabetes mellitus, and hyperlipidemia. Accompanied to this appointment by her husband, who contributes to the history.      Surveillance colonoscopy revealed left-sided diverticulosis and a small descending colon tubular adenoma.  Symptoms improved after treatment for H. Pylori with Flagyl, tetracycline, bismuth, and pantoprazole.  Follow-up H. pylori stool antigen was negative for H. pylori 04/20/2021. .   Today she reports ongoing LUQ pain, abdominal  swelling, and recent diarrhea. She is having postprandial diarrhea that occurs within minutes of eating. but also experiences some constipation. She worries about eating out due to the symptoms. She feels like she always needs to know where the bathroom is.   Her weight has been stable after losing nearly 25 pounds. She would like to be losing more weight but she has been on a microwave popcorn diet.   Endoscopic history: - Colonoscopy 2005: large IC valve but no polyps - Colonoscopy 2015: pancolonic diverticulosis and 2 tubular adenomas - Colonoscopy 12/16/20: diverticulosis, 62mm descending colon tubular adenoma - EGD 12/16/20: irregular Z-line, biopsies confirming reflux and H. pylori gastritis. No EOE. Past Medical History:  Diagnosis Date   Age-related nuclear cataract of right eye 04/15/2020   Arthritis    Cataract    Colon polyps    Diabetes mellitus ([place])    Gallstones    GERD (gastroesophageal reflux disease)    Hearing loss    right ear   Heart murmur    Hyperlipidemia    Hypertension    Hypothyroidism    Tachycardia    Vitreomacular adhesion of right eye 04/15/2020    Past Surgical History:  Procedure Laterality Date   ABDOMINAL HYSTERECTOMY     x 2   APPENDECTOMY     CHOLECYSTECTOMY     with appendectomy   COLONOSCOPY  12/16/2020   TONSILLECTOMY      Current Outpatient Medications  Medication Sig Dispense Refill   aspirin 325 MG tablet Take 325  mg by mouth daily.     calcium carbonate (TUMS EX) 750 MG chewable tablet Chew 1 tablet by mouth as needed for heartburn.     Calcium Carbonate Antacid (MAALOX PO) Take 1 Dose by mouth 2 (two) times daily.     carvedilol (COREG) 6.25 MG tablet TAKE ONE AND ONE-HALF IN THE AM AND ONE TABLET IN THE PM 270 tablet 1   cholecalciferol (VITAMIN D) 25 MCG (1000 UNIT) tablet      Coenzyme Q10 (CO Q 10) 100 MG CAPS Take 300 mg by mouth daily.     dicyclomine (BENTYL) 10 MG capsule Take 1 capsule (10 mg total) by mouth 4 (four) times  daily -  before meals and at bedtime. 120 capsule 0   empagliflozin (JARDIANCE) 10 MG TABS tablet Take 5 mg by mouth daily. 5mg  in the morning     ezetimibe (ZETIA) 10 MG tablet TAKE 1 TABLET DAILY 90 tablet 3   famotidine (PEPCID) 20 MG tablet Take 20 mg by mouth as needed.     glimepiride (AMARYL) 2 MG tablet Take 2 mg by mouth daily before breakfast.     hydrochlorothiazide (HYDRODIURIL) 25 MG tablet TAKE 1 TABLET DAILY 90 tablet 3   levothyroxine (SYNTHROID, LEVOTHROID) 137 MCG tablet Take 137 mcg by mouth daily before breakfast. One daily, 1 and 1/2 on Sunday     linaGLIPtin-metFORMIN HCl ER 2.01-999 MG TB24 Take 2 tablets by mouth every morning.      lisinopril (PRINIVIL,ZESTRIL) 40 MG tablet Take 40 mg by mouth daily.     metroNIDAZOLE (METROCREAM) 0.75 % cream Apply 1 application topically daily.     pantoprazole (PROTONIX) 40 MG tablet Take 1 tablet (40 mg total) by mouth 2 (two) times daily. 180 tablet 3   No current facility-administered medications for this visit.    Allergies as of 06/22/2021 - Review Complete 06/22/2021  Allergen Reaction Noted   Demerol [meperidine] Other (See Comments) 12/07/2012   Macrobid [nitrofurantoin macrocrystal] Other (See Comments) 12/07/2012   Macrobid [nitrofurantoin]  03/10/2021   Meclizine Other (See Comments) 12/07/2012   Sulfa antibiotics Other (See Comments) 12/07/2012    Family History  Problem Relation Age of Onset   Hypertension Mother        also cerebral hemorrhage   Cerebral aneurysm Mother        also cerebral hemorrhage   Hypertension Father    Diabetes Father        also Alzhemiers   Alzheimer's disease Father    Breast cancer Paternal Grandmother    CAD Maternal Grandfather        Hardening of the arteries   Cerebral aneurysm Maternal Grandfather    Hypertension Sister    Anxiety disorder Sister    Hypertension Son    Colon cancer Neg Hx    Esophageal cancer Neg Hx    Stomach cancer Neg Hx    Rectal cancer Neg Hx     Colon polyps Neg Hx       Physical Exam: Weight 211 pounds 04/20/21 Weight today 212 pounds Vital signs reviewed General:   Alert,  well-nourished, pleasant and cooperative in NAD Head:  Normocephalic and atraumatic. Eyes:  Sclera clear, no icterus.   Conjunctiva pink. Abdomen:  Soft, nontender, nondistended, normal bowel sounds, no rebound or guarding. No hepatosplenomegaly.   Neurologic:  Alert and  oriented x4;  grossly nonfocal Skin:  Intact without significant lesions or rashes. Psych:  Alert and cooperative. Normal mood and affect.  Raynell Scott L. Tarri Glenn, MD, MPH 06/23/2021, 6:00 AM

## 2021-06-23 ENCOUNTER — Encounter: Payer: Self-pay | Admitting: Gastroenterology

## 2021-07-14 ENCOUNTER — Ambulatory Visit
Admission: RE | Admit: 2021-07-14 | Discharge: 2021-07-14 | Disposition: A | Discharge status: Home / Self Care | Payer: Medicare Other | Source: Ambulatory Visit | Attending: Endocrinology | Admitting: Endocrinology

## 2021-07-14 ENCOUNTER — Other Ambulatory Visit: Payer: Self-pay

## 2021-07-14 DIAGNOSIS — Z1231 Encounter for screening mammogram for malignant neoplasm of breast: Secondary | ICD-10-CM

## 2021-07-15 ENCOUNTER — Encounter (INDEPENDENT_AMBULATORY_CARE_PROVIDER_SITE_OTHER): Payer: Self-pay

## 2021-07-15 DIAGNOSIS — H04123 Dry eye syndrome of bilateral lacrimal glands: Secondary | ICD-10-CM | POA: Diagnosis not present

## 2021-07-15 DIAGNOSIS — H25012 Cortical age-related cataract, left eye: Secondary | ICD-10-CM | POA: Diagnosis not present

## 2021-07-15 DIAGNOSIS — H2512 Age-related nuclear cataract, left eye: Secondary | ICD-10-CM | POA: Diagnosis not present

## 2021-07-15 DIAGNOSIS — E119 Type 2 diabetes mellitus without complications: Secondary | ICD-10-CM | POA: Diagnosis not present

## 2021-07-15 DIAGNOSIS — H35363 Drusen (degenerative) of macula, bilateral: Secondary | ICD-10-CM | POA: Diagnosis not present

## 2021-07-23 ENCOUNTER — Encounter (INDEPENDENT_AMBULATORY_CARE_PROVIDER_SITE_OTHER): Payer: Self-pay

## 2021-07-24 ENCOUNTER — Other Ambulatory Visit: Payer: Medicare Other

## 2021-07-29 ENCOUNTER — Ambulatory Visit: Payer: Medicare Other | Admitting: Podiatry

## 2021-07-30 ENCOUNTER — Ambulatory Visit: Payer: Medicare Other | Admitting: Cardiovascular Disease

## 2021-08-03 DIAGNOSIS — I10 Essential (primary) hypertension: Secondary | ICD-10-CM | POA: Diagnosis not present

## 2021-08-03 DIAGNOSIS — E039 Hypothyroidism, unspecified: Secondary | ICD-10-CM | POA: Diagnosis not present

## 2021-08-03 DIAGNOSIS — F411 Generalized anxiety disorder: Secondary | ICD-10-CM | POA: Diagnosis not present

## 2021-08-03 DIAGNOSIS — I5189 Other ill-defined heart diseases: Secondary | ICD-10-CM | POA: Diagnosis not present

## 2021-08-03 DIAGNOSIS — H8110 Benign paroxysmal vertigo, unspecified ear: Secondary | ICD-10-CM | POA: Diagnosis not present

## 2021-08-03 DIAGNOSIS — R269 Unspecified abnormalities of gait and mobility: Secondary | ICD-10-CM | POA: Diagnosis not present

## 2021-08-03 DIAGNOSIS — E559 Vitamin D deficiency, unspecified: Secondary | ICD-10-CM | POA: Diagnosis not present

## 2021-08-03 DIAGNOSIS — N1831 Chronic kidney disease, stage 3a: Secondary | ICD-10-CM | POA: Diagnosis not present

## 2021-08-03 DIAGNOSIS — E785 Hyperlipidemia, unspecified: Secondary | ICD-10-CM | POA: Diagnosis not present

## 2021-08-03 DIAGNOSIS — E669 Obesity, unspecified: Secondary | ICD-10-CM | POA: Diagnosis not present

## 2021-08-03 DIAGNOSIS — I129 Hypertensive chronic kidney disease with stage 1 through stage 4 chronic kidney disease, or unspecified chronic kidney disease: Secondary | ICD-10-CM | POA: Diagnosis not present

## 2021-08-03 DIAGNOSIS — E1142 Type 2 diabetes mellitus with diabetic polyneuropathy: Secondary | ICD-10-CM | POA: Diagnosis not present

## 2021-08-04 ENCOUNTER — Other Ambulatory Visit: Payer: Self-pay

## 2021-08-04 ENCOUNTER — Ambulatory Visit (INDEPENDENT_AMBULATORY_CARE_PROVIDER_SITE_OTHER): Payer: Medicare Other | Admitting: Podiatry

## 2021-08-04 DIAGNOSIS — M79674 Pain in right toe(s): Secondary | ICD-10-CM | POA: Diagnosis not present

## 2021-08-04 DIAGNOSIS — M2011 Hallux valgus (acquired), right foot: Secondary | ICD-10-CM | POA: Diagnosis not present

## 2021-08-04 DIAGNOSIS — B351 Tinea unguium: Secondary | ICD-10-CM | POA: Diagnosis not present

## 2021-08-04 DIAGNOSIS — M2141 Flat foot [pes planus] (acquired), right foot: Secondary | ICD-10-CM | POA: Diagnosis not present

## 2021-08-04 DIAGNOSIS — M79675 Pain in left toe(s): Secondary | ICD-10-CM

## 2021-08-04 DIAGNOSIS — M2142 Flat foot [pes planus] (acquired), left foot: Secondary | ICD-10-CM | POA: Diagnosis not present

## 2021-08-04 DIAGNOSIS — M2012 Hallux valgus (acquired), left foot: Secondary | ICD-10-CM

## 2021-08-04 DIAGNOSIS — E119 Type 2 diabetes mellitus without complications: Secondary | ICD-10-CM | POA: Diagnosis not present

## 2021-08-04 NOTE — Progress Notes (Addendum)
ANNUAL DIABETIC FOOT EXAM  Subjective: Pamela [name] presents today for for annual diabetic foot examination.  Patient relates 31 year h/o diabetes.  Patient denies any h/o foot wounds. States feet get cold at times.  Patient denies any numbness, tingling, burning, or pins/needle sensation in feet.  Blood glucose was 106 mg/dl on yesterday.   She has recently recovered from [place].  Reynold Bowen, MD is patient's PCP. Last visit was 08/03/2021.  Past Medical History:  Diagnosis Date   Age-related nuclear cataract of right eye 04/15/2020   Arthritis    Cataract    Colon polyps    Diabetes mellitus ([place])    Gallstones    GERD (gastroesophageal reflux disease)    Hearing loss    right ear   Heart murmur    Hyperlipidemia    Hypertension    Hypothyroidism    Tachycardia    Vitreomacular adhesion of right eye 04/15/2020   Patient Active Problem List   Diagnosis Date Noted   Diastolic dysfunction 32/95/1884   Gastroesophageal reflux disease without esophagitis 05/26/2021   Pseudophakia of right eye 05/21/2021   Age-related nuclear cataract of left eye 04/15/2020   Vitreomacular adhesion of left eye 04/15/2020   Chronic kidney disease, stage 3a ([place]) 10/30/2019   Encounter for general adult medical examination without abnormal findings 03/26/2019   Chronic kidney disease due to hypertension 03/08/2019   Abnormal gait 05/24/2018   Benign neoplasm of colon 01/24/2018   History of hypothyroidism 03/12/2015   Polyneuropathy due to type 2 diabetes mellitus ([place]) 02/04/2015   Callosity 06/29/2013   Obesity (BMI 30-39.9) 04/07/2013   Hypertension 04/07/2013   Diabetes mellitus without complication ([place]) 16/60/6301   Hyperlipidemia with target LDL less than 70 04/07/2013   Generalized anxiety disorder 01/29/2013   Raynaud's disease 05/05/2012   Benign paroxysmal positional vertigo 10/26/2011   Vitamin D deficiency 08/11/2009   Hypothyroidism 05/22/2009   Past Surgical  History:  Procedure Laterality Date   ABDOMINAL HYSTERECTOMY     x 2   APPENDECTOMY     CHOLECYSTECTOMY     with appendectomy   COLONOSCOPY  12/16/2020   TONSILLECTOMY     Current Outpatient Medications on File Prior to Visit  Medication Sig Dispense Refill   aspirin 325 MG tablet Take 325 mg by mouth daily.     benzonatate (TESSALON) 100 MG capsule Take 100 mg by mouth 3 (three) times daily.     calcium carbonate (TUMS EX) 750 MG chewable tablet Chew 1 tablet by mouth as needed for heartburn.     Calcium Carbonate Antacid (MAALOX PO) Take 1 Dose by mouth 2 (two) times daily.     carvedilol (COREG) 6.25 MG tablet TAKE ONE AND ONE-HALF IN THE AM AND ONE TABLET IN THE PM 270 tablet 1   Cephalexin 250 MG tablet Take 250 mg by mouth 4 (four) times daily.     cholecalciferol (VITAMIN D) 25 MCG (1000 UNIT) tablet      Coenzyme Q10 (CO Q 10) 100 MG CAPS Take 300 mg by mouth daily.     dicyclomine (BENTYL) 10 MG capsule Take 1 capsule (10 mg total) by mouth 4 (four) times daily -  before meals and at bedtime. 120 capsule 0   empagliflozin (JARDIANCE) 10 MG TABS tablet Take 5 mg by mouth daily. 5mg  in the morning     ezetimibe (ZETIA) 10 MG tablet TAKE 1 TABLET DAILY 90 tablet 3   famotidine (PEPCID) 20 MG tablet Take 20  mg by mouth as needed.     glimepiride (AMARYL) 2 MG tablet Take 2 mg by mouth daily before breakfast.     hydrochlorothiazide (HYDRODIURIL) 25 MG tablet TAKE 1 TABLET DAILY 90 tablet 3   LAGEVRIO 200 MG CAPS capsule SMARTSIG:4 Capsule(s) By Mouth Every 12 Hours     levothyroxine (SYNTHROID, LEVOTHROID) 137 MCG tablet Take 137 mcg by mouth daily before breakfast. One daily, 1 and 1/2 on Sunday     linaGLIPtin-metFORMIN HCl ER 2.01-999 MG TB24 Take 2 tablets by mouth every morning.      lisinopril (PRINIVIL,ZESTRIL) 40 MG tablet Take 40 mg by mouth daily.     metroNIDAZOLE (METROCREAM) 0.75 % cream Apply 1 application topically daily.     pantoprazole (PROTONIX) 40 MG tablet  Take 1 tablet (40 mg total) by mouth 2 (two) times daily. 180 tablet 3   No current facility-administered medications on file prior to visit.    Allergies  Allergen Reactions   Demerol [Meperidine] Other (See Comments)    Unspecified    Macrobid [Nitrofurantoin Macrocrystal] Other (See Comments)    Unspecified    Macrobid [Nitrofurantoin]     Other reaction(s): hearing loss in R ear   Meclizine Other (See Comments)    Unspecified    Sulfa Antibiotics Other (See Comments)    Unspecified    Social History   Occupational History   Occupation: retired  Tobacco Use   Smoking status: Never   Smokeless tobacco: Never  Vaping Use   Vaping Use: Never used  Substance and Sexual Activity   Alcohol use: No   Drug use: No   Sexual activity: Not Currently    Partners: Male    Comment: intercourse- 83, partners-1, Married- 73 yrs   Family History  Problem Relation Age of Onset   Hypertension Mother        also cerebral hemorrhage   Cerebral aneurysm Mother        also cerebral hemorrhage   Hypertension Father    Diabetes Father        also Alzhemiers   Alzheimer's disease Father    Breast cancer Paternal Grandmother    CAD Maternal Grandfather        Hardening of the arteries   Cerebral aneurysm Maternal Grandfather    Hypertension Sister    Anxiety disorder Sister    Hypertension Son    Colon cancer Neg Hx    Esophageal cancer Neg Hx    Stomach cancer Neg Hx    Rectal cancer Neg Hx    Colon polyps Neg Hx     There is no immunization history on file for this patient.   Review of Systems: Negative except as noted in the HPI.   Objective: There were no vitals filed for this visit.  Pamela [name] is a pleasant 75 y.o. female in NAD. AAO X 3.  Vascular Examination: CFT immediate b/l LE. Palpable DP/PT pulses b/l LE. Digital hair present b/l. Skin temperature gradient WNL b/l. No pain with calf compression b/l. No edema noted b/l. No cyanosis or clubbing noted b/l  LE.   Dermatological Examination: Pedal integument with normal turgor, texture and tone b/l LE. No open wounds b/l. No interdigital macerations b/l. Toenails 1-5 b/l elongated, thickened, discolored with subungual debris. +Tenderness with dorsal palpation of nailplates. No hyperkeratotic or porokeratotic lesions present. Soft tissue mass dorsal aspect right hallux. Lesion is not tense and is movable. Appears to have a venous component with blue hue.  Nonpulsatile.   Musculoskeletal Examination: Normal muscle strength 5/5 to all lower extremity muscle groups bilaterally. HAV with bunion deformity noted b/l LE. Pes planus deformity noted bilateral LE.Marland Kitchen No pain, crepitus or joint limitation noted with ROM b/l LE.  Patient ambulates independently without assistive aids.  Footwear Assessment: Does the patient wear appropriate shoes? Yes. Does the patient need inserts/orthotics? Yes.  Neurological Examination: Protective sensation intact 5/5 intact bilaterally with 10g monofilament b/l. Vibratory sensation intact b/l. Proprioception intact bilaterally.  Assessment: 1. Pain due to onychomycosis of toenails of both feet   2. Diabetes mellitus without complication (HCC)   3. Hallux valgus, acquired, bilateral   4. Pes planus of both feet   5. Encounter for diabetic foot exam ([place])     ADA Risk Categorization: Low Risk :  Patient has all of the following: Intact protective sensation No prior foot ulcer  No severe deformity Pedal pulses present  Plan: -Diabetic foot examination performed today. -Continue diabetic foot care principles: inspect feet daily, monitor glucose as recommended by PCP and/or Endocrinologist, and follow prescribed diet per PCP, Endocrinologist and/or dietician. -Mycotic toenails 1-5 bilaterally were debrided in length and girth with sterile nail nippers and dremel without incident. -Patient/POA to call should there be question/concern in the interim.  Return in about 9  weeks (around 10/06/2021).  Marzetta Board, DPM

## 2021-08-07 ENCOUNTER — Encounter: Payer: Self-pay | Admitting: Podiatry

## 2021-08-17 ENCOUNTER — Emergency Department (HOSPITAL_COMMUNITY): Payer: Medicare Other

## 2021-08-17 ENCOUNTER — Other Ambulatory Visit: Payer: Self-pay

## 2021-08-17 ENCOUNTER — Observation Stay (HOSPITAL_COMMUNITY)
Admission: EM | Admit: 2021-08-17 | Discharge: 2021-08-18 | Disposition: A | Discharge status: Home / Self Care | Payer: Medicare Other | Attending: Internal Medicine | Admitting: Internal Medicine

## 2021-08-17 DIAGNOSIS — I629 Nontraumatic intracranial hemorrhage, unspecified: Secondary | ICD-10-CM

## 2021-08-17 DIAGNOSIS — R0902 Hypoxemia: Secondary | ICD-10-CM | POA: Diagnosis not present

## 2021-08-17 DIAGNOSIS — I618 Other nontraumatic intracerebral hemorrhage: Secondary | ICD-10-CM | POA: Diagnosis not present

## 2021-08-17 DIAGNOSIS — Z20822 Contact with and (suspected) exposure to covid-19: Secondary | ICD-10-CM | POA: Insufficient documentation

## 2021-08-17 DIAGNOSIS — D649 Anemia, unspecified: Secondary | ICD-10-CM

## 2021-08-17 DIAGNOSIS — I10 Essential (primary) hypertension: Secondary | ICD-10-CM | POA: Diagnosis present

## 2021-08-17 DIAGNOSIS — R0789 Other chest pain: Secondary | ICD-10-CM | POA: Diagnosis not present

## 2021-08-17 DIAGNOSIS — R079 Chest pain, unspecified: Secondary | ICD-10-CM

## 2021-08-17 DIAGNOSIS — Z79899 Other long term (current) drug therapy: Secondary | ICD-10-CM | POA: Insufficient documentation

## 2021-08-17 DIAGNOSIS — S0633AA Contusion and laceration of cerebrum, unspecified, with loss of consciousness status unknown, initial encounter: Secondary | ICD-10-CM | POA: Diagnosis present

## 2021-08-17 DIAGNOSIS — N1831 Chronic kidney disease, stage 3a: Secondary | ICD-10-CM | POA: Insufficient documentation

## 2021-08-17 DIAGNOSIS — I680 Cerebral amyloid angiopathy: Secondary | ICD-10-CM | POA: Diagnosis present

## 2021-08-17 DIAGNOSIS — E119 Type 2 diabetes mellitus without complications: Secondary | ICD-10-CM | POA: Diagnosis not present

## 2021-08-17 DIAGNOSIS — I619 Nontraumatic intracerebral hemorrhage, unspecified: Secondary | ICD-10-CM | POA: Diagnosis present

## 2021-08-17 DIAGNOSIS — K219 Gastro-esophageal reflux disease without esophagitis: Secondary | ICD-10-CM | POA: Diagnosis present

## 2021-08-17 DIAGNOSIS — R519 Headache, unspecified: Secondary | ICD-10-CM

## 2021-08-17 DIAGNOSIS — I129 Hypertensive chronic kidney disease with stage 1 through stage 4 chronic kidney disease, or unspecified chronic kidney disease: Secondary | ICD-10-CM | POA: Insufficient documentation

## 2021-08-17 DIAGNOSIS — Z7982 Long term (current) use of aspirin: Secondary | ICD-10-CM | POA: Insufficient documentation

## 2021-08-17 DIAGNOSIS — S06340A Traumatic hemorrhage of right cerebrum without loss of consciousness, initial encounter: Secondary | ICD-10-CM | POA: Diagnosis not present

## 2021-08-17 DIAGNOSIS — I611 Nontraumatic intracerebral hemorrhage in hemisphere, cortical: Secondary | ICD-10-CM | POA: Diagnosis not present

## 2021-08-17 DIAGNOSIS — E785 Hyperlipidemia, unspecified: Secondary | ICD-10-CM | POA: Diagnosis present

## 2021-08-17 DIAGNOSIS — E039 Hypothyroidism, unspecified: Secondary | ICD-10-CM | POA: Diagnosis not present

## 2021-08-17 DIAGNOSIS — R42 Dizziness and giddiness: Secondary | ICD-10-CM | POA: Diagnosis not present

## 2021-08-17 DIAGNOSIS — E854 Organ-limited amyloidosis: Secondary | ICD-10-CM

## 2021-08-17 LAB — BASIC METABOLIC PANEL
Anion gap: 8 (ref 5–15)
BUN: 13 mg/dL (ref 8–23)
CO2: 24 mmol/L (ref 22–32)
Calcium: 9.2 mg/dL (ref 8.9–10.3)
Chloride: 109 mmol/L (ref 98–111)
Creatinine, Ser: 0.82 mg/dL (ref 0.44–1.00)
GFR, Estimated: >60 mL/min (ref >60)
Glucose, Bld: 148 mg/dL — ABNORMAL HIGH (ref 70–99)
Potassium: 3.9 mmol/L (ref 3.5–5.1)
Sodium: 141 mmol/L (ref 135–145)

## 2021-08-17 LAB — CBC
HCT: 37.2 % (ref 36.0–46.0)
Hemoglobin: 11.5 g/dL — ABNORMAL LOW (ref 12.0–15.0)
MCH: 27.3 pg (ref 26.0–34.0)
MCHC: 30.9 g/dL (ref 30.0–36.0)
MCV: 88.4 fL (ref 80.0–100.0)
Platelets: 304 10*3/uL (ref 150–400)
RBC: 4.21 MIL/uL (ref 3.87–5.11)
RDW: 17.3 % — ABNORMAL HIGH (ref 11.5–15.5)
WBC: 8.6 10*3/uL (ref 4.0–10.5)
nRBC: 0 % (ref 0.0–0.2)

## 2021-08-17 LAB — TROPONIN I (HIGH SENSITIVITY)
Troponin I (High Sensitivity): 7 ng/L (ref <18)
Troponin I (High Sensitivity): 9 ng/L (ref <18)

## 2021-08-17 MED ORDER — LORAZEPAM 2 MG/ML IJ SOLN
1.0000 mg | Freq: Once | INTRAMUSCULAR | Status: AC
  Administered 2021-08-17: 1 mg via INTRAVENOUS
  Filled 2021-08-17: qty 1

## 2021-08-17 NOTE — ED Provider Notes (Signed)
[facility] EMERGENCY DEPARTMENT Provider Note   CSN: [id number] Arrival date & time: 08/17/21  1542     History Chief Complaint  Patient presents with   Chest Pain    Pamela [name] is a 75 y.o. female.  Patient with history of diabetes, lipids, high blood pressure, hypothyroidism, normal stress test 2014, follows with local doctor and cardiologist presents after episode of chest discomfort no specific radiation started on 12:00 today.  Patient also had frontal headache at the same time and numbness in the right hand.  Symptoms of completely resolved gradually.  No recent exertional symptoms.  No recent surgery, blood clot history, leg swelling or active cancer treatment.      Past Medical History:  Diagnosis Date   Age-related nuclear cataract of right eye 04/15/2020   Arthritis    Cataract    Colon polyps    Diabetes mellitus ([place])    Gallstones    GERD (gastroesophageal reflux disease)    Hearing loss    right ear   Heart murmur    Hyperlipidemia    Hypertension    Hypothyroidism    Tachycardia    Vitreomacular adhesion of right eye 04/15/2020    Patient Active Problem List   Diagnosis Date Noted   Diastolic dysfunction 81/44/8185   Gastroesophageal reflux disease without esophagitis 05/26/2021   Pseudophakia of right eye 05/21/2021   Age-related nuclear cataract of left eye 04/15/2020   Vitreomacular adhesion of left eye 04/15/2020   Chronic kidney disease, stage 3a ([place]) 10/30/2019   Encounter for general adult medical examination without abnormal findings 03/26/2019   Chronic kidney disease due to hypertension 03/08/2019   Abnormal gait 05/24/2018   Benign neoplasm of colon 01/24/2018   History of hypothyroidism 03/12/2015   Polyneuropathy due to type 2 diabetes mellitus ([place]) 02/04/2015   Callosity 06/29/2013   Obesity (BMI 30-39.9) 04/07/2013   Hypertension 04/07/2013   Diabetes mellitus without complication ([place]) 63/14/9702    Hyperlipidemia with target LDL less than 70 04/07/2013   Generalized anxiety disorder 01/29/2013   Raynaud's disease 05/05/2012   Benign paroxysmal positional vertigo 10/26/2011   Vitamin D deficiency 08/11/2009   Hypothyroidism 05/22/2009    Past Surgical History:  Procedure Laterality Date   ABDOMINAL HYSTERECTOMY     x 2   APPENDECTOMY     CHOLECYSTECTOMY     with appendectomy   COLONOSCOPY  12/16/2020   TONSILLECTOMY       OB History     Gravida  1   Para  1   Term      Preterm      AB      Living  1      SAB      IAB      Ectopic      Multiple      Live Births              Family History  Problem Relation Age of Onset   Hypertension Mother        also cerebral hemorrhage   Cerebral aneurysm Mother        also cerebral hemorrhage   Hypertension Father    Diabetes Father        also Alzhemiers   Alzheimer's disease Father    Breast cancer Paternal Grandmother    CAD Maternal Grandfather        Hardening of the arteries   Cerebral aneurysm Maternal Grandfather    Hypertension Sister  Anxiety disorder Sister    Hypertension Son    Colon cancer Neg Hx    Esophageal cancer Neg Hx    Stomach cancer Neg Hx    Rectal cancer Neg Hx    Colon polyps Neg Hx     Social History   Tobacco Use   Smoking status: Never   Smokeless tobacco: Never  Vaping Use   Vaping Use: Never used  Substance Use Topics   Alcohol use: No   Drug use: No    Home Medications Prior to Admission medications   Medication Sig Start Date End Date Taking? Authorizing Provider  aspirin 325 MG tablet Take 325 mg by mouth daily.   Yes [provider]  benzonatate (TESSALON) 100 MG capsule Take 100 mg by mouth 3 (three) times daily as needed for cough. 08/03/21  Yes [provider]  calcium carbonate (TUMS EX) 750 MG chewable tablet Chew 1 tablet by mouth as needed for heartburn.   Yes [provider]  carvedilol (COREG) 6.25 MG tablet TAKE  ONE AND ONE-HALF IN THE AM AND ONE TABLET IN THE PM 01/12/21  Yes Troy Sine, MD  cholecalciferol (VITAMIN D) 25 MCG (1000 UNIT) tablet 1,000 Units daily. 01/13/16  Yes [provider]  Coenzyme Q10 (CO Q 10) 100 MG CAPS Take 300 mg by mouth daily.   Yes [provider]  dicyclomine (BENTYL) 10 MG capsule Take 1 capsule (10 mg total) by mouth 4 (four) times daily -  before meals and at bedtime. 06/22/21  Yes Thornton Park, MD  empagliflozin (JARDIANCE) 10 MG TABS tablet Take 5 mg by mouth daily.   Yes [provider]  ezetimibe (ZETIA) 10 MG tablet TAKE 1 TABLET DAILY 10/16/20  Yes Troy Sine, MD  famotidine (PEPCID) 20 MG tablet Take 20 mg by mouth daily as needed for heartburn or indigestion. 10/20/18  Yes [provider]  glimepiride (AMARYL) 2 MG tablet Take 2 mg by mouth daily before breakfast.   Yes [provider]  hydrochlorothiazide (HYDRODIURIL) 25 MG tablet TAKE 1 TABLET DAILY 11/04/20  Yes Troy Sine, MD  levothyroxine (SYNTHROID, LEVOTHROID) 137 MCG tablet Take 137 mcg by mouth daily before breakfast. One daily, 1 and 1/2 on Sunday   Yes [provider]  linaGLIPtin-metFORMIN HCl ER 2.01-999 MG TB24 Take 2 tablets by mouth every morning.    Yes [provider]  lisinopril (PRINIVIL,ZESTRIL) 40 MG tablet Take 40 mg by mouth daily.   Yes [provider]  metroNIDAZOLE (METROCREAM) 0.75 % cream Apply 1 application topically daily as needed (rosacea). 01/16/20  Yes [provider]  pantoprazole (PROTONIX) 40 MG tablet Take 1 tablet (40 mg total) by mouth 2 (two) times daily. 06/22/21  Yes Thornton Park, MD    Allergies    Demerol [meperidine], Macrobid [nitrofurantoin macrocrystal], Macrobid [nitrofurantoin], Meclizine, and Sulfa antibiotics  Review of Systems   Review of Systems  Constitutional:  Negative for chills and fever.  HENT:  Negative for congestion.   Eyes:  Negative for visual  disturbance.  Respiratory:  Negative for shortness of breath.   Cardiovascular:  Positive for chest pain.  Gastrointestinal:  Negative for abdominal pain and vomiting.  Genitourinary:  Negative for dysuria and flank pain.  Musculoskeletal:  Negative for back pain, neck pain and neck stiffness.  Skin:  Negative for rash.  Neurological:  Positive for light-headedness and headaches.   Physical Exam Updated Vital Signs BP (!) 144/57   Pulse  68   Temp 98.6 F (37 C) (Oral)   Resp 19   SpO2 99%   Physical Exam Vitals and nursing note reviewed.  Constitutional:      General: She is not in acute distress.    Appearance: She is well-developed.  HENT:     Head: Normocephalic and atraumatic.     Mouth/Throat:     Mouth: Mucous membranes are moist.  Eyes:     General:        Right eye: No discharge.        Left eye: No discharge.     Conjunctiva/sclera: Conjunctivae normal.  Neck:     Trachea: No tracheal deviation.  Cardiovascular:     Rate and Rhythm: Normal rate and regular rhythm.     Heart sounds: No murmur heard. Pulmonary:     Effort: Pulmonary effort is normal.     Breath sounds: Normal breath sounds.  Abdominal:     General: There is no distension.     Palpations: Abdomen is soft.     Tenderness: There is no abdominal tenderness. There is no guarding.  Musculoskeletal:        General: Normal range of motion.     Cervical back: Normal range of motion and neck supple. No rigidity.     Right lower leg: No edema.     Left lower leg: No edema.  Skin:    General: Skin is warm.     Capillary Refill: Capillary refill takes less than 2 seconds.     Findings: No rash.  Neurological:     General: No focal deficit present.     Mental Status: She is alert.     Cranial Nerves: No cranial nerve deficit.     Motor: No weakness.  Psychiatric:        Mood and Affect: Mood normal.    ED Results / Procedures / Treatments   Labs (all labs ordered are listed, but only abnormal  results are displayed) Labs Reviewed  BASIC METABOLIC PANEL - Abnormal; Notable for the following components:      Result Value   Glucose, Bld 148 (*)    All other components within normal limits  CBC - Abnormal; Notable for the following components:   Hemoglobin 11.5 (*)    RDW 17.3 (*)    All other components within normal limits  TROPONIN I (HIGH SENSITIVITY)  TROPONIN I (HIGH SENSITIVITY)    EKG EKG Interpretation  Date/Time:  Monday August 17 2021 15:57:09 EST Ventricular Rate:  74 PR Interval:  146 QRS Duration: 66 QT Interval:  370 QTC Calculation: 410 R Axis:   30 Text Interpretation: Normal sinus rhythm Low voltage QRS Borderline ECG Confirmed by Elnora Morrison [phone]) on 08/17/2021 7:52:24 PM  Radiology DG Chest 2 View  Result Date: 08/17/2021 CLINICAL DATA:  Chest pain and headache x1 day. EXAM: CHEST - 2 VIEW COMPARISON:  June 01, 2009 FINDINGS: The heart size and mediastinal contours are within normal limits. No focal consolidation. No pleural effusion. No pneumothorax. Thoracic spondylosis. IMPRESSION: No active cardiopulmonary disease. Electronically Signed   By: Dahlia Bailiff M.D.   On: 08/17/2021 16:56    Procedures Procedures   Medications Ordered in ED Medications - No data to display  ED Course  I have reviewed the triage vital signs and the nursing notes.  Pertinent labs & imaging results that were available during my care of the patient were reviewed by me and considered in my medical decision  making (see chart for details).    MDM Rules/Calculators/A&P                           Patient presents with headache and chest pain.  Discussed broad differential diagnosis including ACS, reflux, musculoskeletal, heart failure, pulm emboli, anxiety, intracranial hemorrhage, other.  Fortunately patient signs and symptoms have resolved.  Patient had blood work ordered and I reviewed it 2 negative troponins and along with nonischemic EKG patient stable  for close outpatient follow-up with cardiology to arrange another stress test.  No blood clot risk factors, no pleuritic chest pain, no shortness of breath, normal heart rate and normal oxygenation.  With headache that was different than previous plan for screening CT of the head.  If patient continues to feel well she is comfortable with discharge.  Discussed with radiology and concern for small density right frontal lobe recommends MRI for further delineation without contrast.  Patient care be signed out to follow-up MRI results and reassess.   Final Clinical Impression(s) / ED Diagnoses Final diagnoses:  Nonintractable headache, unspecified chronicity pattern, unspecified headache type  Acute chest pain    Rx / DC Orders ED Discharge Orders     None        Elnora Morrison, MD 08/17/21 2237

## 2021-08-17 NOTE — Discharge Instructions (Signed)
Follow-up closely with cardiology as discussed. Return for persistent, new or worsening signs or symptoms.

## 2021-08-17 NOTE — ED Provider Notes (Signed)
Emergency Medicine Provider Triage Evaluation Note  PARISH AUGUSTINE , a 75 y.o. female  was evaluated in triage.  Pt complains of chest pain that started earlier this AM around 12. Has improved since onset. Associated with lightheadedness and generalized weakness.  Review of Systems  Positive: Chest pain, lightheaded, weakness Negative: fever  Physical Exam  BP (!) 149/67 (BP Location: Right Arm)   Pulse 76   Temp 98.6 F (37 C) (Oral)   Resp 14   SpO2 97%  Gen:   Awake, no distress   Resp:  Normal effort  MSK:   Moves extremities without difficulty  Other:  Heart rrr, lungs ctab  Medical Decision Making  Medically screening exam initiated at 3:57 PM.  Appropriate orders placed.  ANITTA TENNY was informed that the remainder of the evaluation will be completed by another provider, this initial triage assessment does not replace that evaluation, and the importance of remaining in the ED until their evaluation is complete.     Bishop Dublin 08/17/21 1558    Davonna Belling, MD 08/18/21 1245

## 2021-08-17 NOTE — ED Triage Notes (Signed)
Pt with one hour of epigastric chest pain with tingling in R hand. Pain had subsided before EMS arrival. EMS gave 324 ASA and 1 SL nitro. Pt remains pain free.

## 2021-08-17 NOTE — ED Notes (Signed)
Called Pt name 3x no answer

## 2021-08-17 NOTE — ED Notes (Signed)
Patient transported to MRI 

## 2021-08-18 ENCOUNTER — Encounter (HOSPITAL_COMMUNITY): Payer: Self-pay | Admitting: Internal Medicine

## 2021-08-18 ENCOUNTER — Observation Stay (HOSPITAL_COMMUNITY): Payer: Medicare Other

## 2021-08-18 DIAGNOSIS — I619 Nontraumatic intracerebral hemorrhage, unspecified: Secondary | ICD-10-CM | POA: Diagnosis present

## 2021-08-18 DIAGNOSIS — E785 Hyperlipidemia, unspecified: Secondary | ICD-10-CM

## 2021-08-18 DIAGNOSIS — I611 Nontraumatic intracerebral hemorrhage in hemisphere, cortical: Secondary | ICD-10-CM | POA: Diagnosis not present

## 2021-08-18 DIAGNOSIS — R0789 Other chest pain: Secondary | ICD-10-CM

## 2021-08-18 DIAGNOSIS — E119 Type 2 diabetes mellitus without complications: Secondary | ICD-10-CM

## 2021-08-18 DIAGNOSIS — D649 Anemia, unspecified: Secondary | ICD-10-CM

## 2021-08-18 DIAGNOSIS — E854 Organ-limited amyloidosis: Secondary | ICD-10-CM | POA: Diagnosis not present

## 2021-08-18 DIAGNOSIS — K219 Gastro-esophageal reflux disease without esophagitis: Secondary | ICD-10-CM

## 2021-08-18 DIAGNOSIS — I680 Cerebral amyloid angiopathy: Secondary | ICD-10-CM

## 2021-08-18 DIAGNOSIS — M5031 Other cervical disc degeneration,  high cervical region: Secondary | ICD-10-CM | POA: Diagnosis not present

## 2021-08-18 DIAGNOSIS — I6523 Occlusion and stenosis of bilateral carotid arteries: Secondary | ICD-10-CM | POA: Diagnosis not present

## 2021-08-18 DIAGNOSIS — S06310A Contusion and laceration of right cerebrum without loss of consciousness, initial encounter: Secondary | ICD-10-CM | POA: Diagnosis not present

## 2021-08-18 DIAGNOSIS — S0633AA Contusion and laceration of cerebrum, unspecified, with loss of consciousness status unknown, initial encounter: Secondary | ICD-10-CM | POA: Diagnosis present

## 2021-08-18 DIAGNOSIS — S06340A Traumatic hemorrhage of right cerebrum without loss of consciousness, initial encounter: Secondary | ICD-10-CM | POA: Diagnosis not present

## 2021-08-18 DIAGNOSIS — I10 Essential (primary) hypertension: Secondary | ICD-10-CM

## 2021-08-18 DIAGNOSIS — M4802 Spinal stenosis, cervical region: Secondary | ICD-10-CM | POA: Diagnosis not present

## 2021-08-18 LAB — CBC
HCT: 35.6 % — ABNORMAL LOW (ref 36.0–46.0)
Hemoglobin: 11 g/dL — ABNORMAL LOW (ref 12.0–15.0)
MCH: 27.2 pg (ref 26.0–34.0)
MCHC: 30.9 g/dL (ref 30.0–36.0)
MCV: 87.9 fL (ref 80.0–100.0)
Platelets: 278 10*3/uL (ref 150–400)
RBC: 4.05 MIL/uL (ref 3.87–5.11)
RDW: 17.5 % — ABNORMAL HIGH (ref 11.5–15.5)
WBC: 9.1 10*3/uL (ref 4.0–10.5)
nRBC: 0 % (ref 0.0–0.2)

## 2021-08-18 LAB — COMPREHENSIVE METABOLIC PANEL
ALT: 12 U/L (ref 0–44)
AST: 16 U/L (ref 15–41)
Albumin: 3.2 g/dL — ABNORMAL LOW (ref 3.5–5.0)
Alkaline Phosphatase: 66 U/L (ref 38–126)
Anion gap: 10 (ref 5–15)
BUN: 12 mg/dL (ref 8–23)
CO2: 23 mmol/L (ref 22–32)
Calcium: 8.9 mg/dL (ref 8.9–10.3)
Chloride: 106 mmol/L (ref 98–111)
Creatinine, Ser: 0.78 mg/dL (ref 0.44–1.00)
GFR, Estimated: >60 mL/min (ref >60)
Glucose, Bld: 94 mg/dL (ref 70–99)
Potassium: 3.6 mmol/L (ref 3.5–5.1)
Sodium: 139 mmol/L (ref 135–145)
Total Bilirubin: 0.7 mg/dL (ref 0.3–1.2)
Total Protein: 6 g/dL — ABNORMAL LOW (ref 6.5–8.1)

## 2021-08-18 LAB — LIPID PANEL
Cholesterol: 164 mg/dL (ref 0–200)
HDL: 59 mg/dL (ref >40)
LDL Cholesterol: 89 mg/dL (ref 0–99)
Total CHOL/HDL Ratio: 2.8 RATIO
Triglycerides: 79 mg/dL (ref <150)
VLDL: 16 mg/dL (ref 0–40)

## 2021-08-18 LAB — HEMOGLOBIN A1C
Hgb A1c MFr Bld: 6.7 % — ABNORMAL HIGH (ref 4.8–5.6)
Mean Plasma Glucose: 145.59 mg/dL

## 2021-08-18 LAB — CBG MONITORING, ED
Glucose-Capillary: 134 mg/dL — ABNORMAL HIGH (ref 70–99)
Glucose-Capillary: 224 mg/dL — ABNORMAL HIGH (ref 70–99)

## 2021-08-18 LAB — RESP PANEL BY RT-PCR (FLU A&B, COVID) ARPGX2
Influenza A by PCR: NEGATIVE
Influenza B by PCR: NEGATIVE
SARS Coronavirus 2 by RT PCR: NEGATIVE

## 2021-08-18 LAB — SEDIMENTATION RATE: Sed Rate: 19 mm/hr (ref 0–22)

## 2021-08-18 LAB — PROTIME-INR
INR: 1 (ref 0.8–1.2)
Prothrombin Time: 13.1 seconds (ref 11.4–15.2)

## 2021-08-18 LAB — MAGNESIUM: Magnesium: 1.4 mg/dL — ABNORMAL LOW (ref 1.7–2.4)

## 2021-08-18 LAB — C-REACTIVE PROTEIN: CRP: 0.6 mg/dL (ref <1.0)

## 2021-08-18 MED ORDER — PANTOPRAZOLE SODIUM 40 MG PO TBEC
40.0000 mg | DELAYED_RELEASE_TABLET | Freq: Two times a day (BID) | ORAL | Status: DC
  Administered 2021-08-18: 40 mg via ORAL
  Filled 2021-08-18: qty 1

## 2021-08-18 MED ORDER — AMLODIPINE BESYLATE 5 MG PO TABS: 5.0000 mg | ORAL_TABLET | Freq: Every day | ORAL | Status: DC

## 2021-08-18 MED ORDER — CARVEDILOL 3.125 MG PO TABS
3.1250 mg | ORAL_TABLET | Freq: Once | ORAL | Status: AC
  Administered 2021-08-18: 3.125 mg via ORAL

## 2021-08-18 MED ORDER — CARVEDILOL 12.5 MG PO TABS: 12.5000 mg | ORAL_TABLET | Freq: Two times a day (BID) | ORAL | 1 refills | Status: DC

## 2021-08-18 MED ORDER — CARVEDILOL 12.5 MG PO TABS
12.5000 mg | ORAL_TABLET | Freq: Two times a day (BID) | ORAL | Status: DC
  Administered 2021-08-18: 12.5 mg via ORAL
  Filled 2021-08-18: qty 1

## 2021-08-18 MED ORDER — LEVOTHYROXINE SODIUM 25 MCG PO TABS
137.0000 ug | ORAL_TABLET | Freq: Every day | ORAL | Status: DC
  Administered 2021-08-18: 137 ug via ORAL
  Filled 2021-08-18: qty 1

## 2021-08-18 MED ORDER — AMLODIPINE BESYLATE 5 MG PO TABS: 5.0000 mg | ORAL_TABLET | Freq: Every day | ORAL | 1 refills | Status: DC

## 2021-08-18 MED ORDER — LISINOPRIL 20 MG PO TABS
40.0000 mg | ORAL_TABLET | Freq: Every day | ORAL | Status: DC
  Administered 2021-08-18: 40 mg via ORAL
  Filled 2021-08-18: qty 2

## 2021-08-18 MED ORDER — IOHEXOL 350 MG/ML SOLN
75.0000 mL | Freq: Once | INTRAVENOUS | Status: AC | PRN
  Administered 2021-08-18: 75 mL via INTRAVENOUS

## 2021-08-18 MED ORDER — ACETAMINOPHEN 650 MG RE SUPP: 650.0000 mg | Freq: Four times a day (QID) | RECTAL | Status: DC | PRN

## 2021-08-18 MED ORDER — INSULIN ASPART 100 UNIT/ML IJ SOLN
0.0000 [IU] | Freq: Three times a day (TID) | INTRAMUSCULAR | Status: DC
  Administered 2021-08-18: 2 [IU] via SUBCUTANEOUS

## 2021-08-18 MED ORDER — HYDRALAZINE HCL 20 MG/ML IJ SOLN: 10.0000 mg | INTRAMUSCULAR | Status: DC | PRN

## 2021-08-18 MED ORDER — EZETIMIBE 10 MG PO TABS
10.0000 mg | ORAL_TABLET | Freq: Every day | ORAL | Status: DC
  Administered 2021-08-18: 10 mg via ORAL
  Filled 2021-08-18: qty 1

## 2021-08-18 MED ORDER — CARVEDILOL 3.125 MG PO TABS
ORAL_TABLET | ORAL | Status: AC
  Filled 2021-08-18: qty 1

## 2021-08-18 MED ORDER — ACETAMINOPHEN 325 MG PO TABS: 650.0000 mg | ORAL_TABLET | Freq: Four times a day (QID) | ORAL | Status: DC | PRN

## 2021-08-18 MED ORDER — HYDRALAZINE HCL 20 MG/ML IJ SOLN
5.0000 mg | Freq: Once | INTRAMUSCULAR | Status: AC
  Administered 2021-08-18: 5 mg via INTRAVENOUS
  Filled 2021-08-18: qty 1

## 2021-08-18 MED ORDER — HYDROCHLOROTHIAZIDE 25 MG PO TABS
25.0000 mg | ORAL_TABLET | Freq: Every day | ORAL | Status: DC
  Administered 2021-08-18: 25 mg via ORAL
  Filled 2021-08-18: qty 1

## 2021-08-18 MED ORDER — FAMOTIDINE 20 MG PO TABS: 20.0000 mg | ORAL_TABLET | Freq: Every day | ORAL | Status: DC | PRN

## 2021-08-18 NOTE — ED Notes (Signed)
Attempted to give report to the floor

## 2021-08-18 NOTE — H&P (Signed)
History and Physical    PLEASE NOTE THAT DRAGON DICTATION SOFTWARE WAS USED IN THE CONSTRUCTION OF THIS NOTE.   BRIGITTA PRICER XBD:[id number] DOB: 1946/06/28 DOA: 08/17/2021  PCP: Reynold Bowen, MD  Patient coming from: home   I have personally briefly reviewed patient's old medical records in [place]  Chief Complaint: Chest pain  HPI: Pamela [name] is a 75 y.o. female with medical history significant for type 2 diabetes mellitus, GERD, hypertension, hyperlipidemia, chronic anemia with baseline hemoglobin 10-12, who is admitted to [facility] on 08/17/2021 with small acute intra parenchymal hemorrhage of the right frontal lobe after presenting from home to [facility] ED complaining of chest pain.   The patient presented to [facility] ED on 08/17/2021 complaining of sharp, substernal, nonradiating chest pain that was nonexertional, nonpleuritic, not reproducible with palpation of the anterior chest wall, nonpositional.  Not associate with any shortness of breath, palpitations, diaphoresis, nausea, vomiting.  This chest pain subsequently resolved in spontaneous fashion, in the absence of any interval nitroglycerin.  No subsequent return of this chest pain, patient confirms at the time of my encounter that she remains chest pain-free.  Denies any preceding trauma, travel, recent surgical procedures, recent periods of diminished ambulatory status.  Denies any personal history of DVT/PE.  No recent calf tenderness or new lower extremity erythema.  No recent subjective fever, chills, rigors, or generalized myalgias.  No cough, hemoptysis.  The patient reports her most recent coronary ischemic evaluation occurred in 2014, at which time she underwent stress test, which was reportedly without evidence of reversible ischemia.  While in the ED today, serial troponin x2 were nonelevated, and EKG showed no evidence of acute ischemic changes.  Plan from the standpoint EDP regarding the patient's  presenting chest pain was to discharge to home with outpatient cardiology follow-up for further evaluation of need for any additional coronary ischemic evaluation.  However, the patient also reported that the above chest pain has been associated with a frontal headache as well as numbness into the right upper extremity.  The symptoms also spontaneously resolved, without recurrence.  Not associated with any acute focal weakness, acute change in vision, facial droop, dysarthria, dysphagia, vertigo.  She denies any known personal history of intracranial hemorrhage, but reports a family history of cerebral aneurysm in both her mother as well as her maternal grandfather, as well as cerebral hemorrhage involving her mother.  The patient conveys that she is on a full dose aspirin on a daily basis, but does not recall the specific indication for this.  No additional blood thinners as outpatient.  Over the last 1 to 1.5 years, she conveys chronic issues with balance serving as a gait disturbance for her.  Denies any recent worsening thereof.  She also acknowledges a history of essential hypertension, for which she is on lisinopril, HCTZ, and carvedilol, noting that her typical blood pressures as an outpatient run in the 130s over 60s.  She also has a history of hyperlipidemia for which she is on Zetia.  She also admits history of type 2 diabetes mellitus, with most recent hemoglobin A1c 6.7% in February 2021.  Not on any insulin as an outpatient, but rather is on empagliflozin, glimepiride, linagliptin, and metformin.  Per chart review, she has a history of chronic anemia with baseline hemoglobin range of 10-12.  She denies a recent melena or hematochezia.     ED Course:  Vital signs in the ED were notable for the following: Afebrile;  heart rate 63-77; blood pressure 129/65-170 3/57, with most recent blood pressure 152/67; respiratory 15-19, oxygen saturation 98% on room air.  Labs were notable for the  following: BMP notable for the following: Creatinine 0.82, glucose 148.  High-sensitivity troponin I initially 70 with repeat value 9.  CBC notable for white blood cell count 11.5, compared to most recent prior value of 10.3 in June 2018, platelet count 308.  COVID-19/Lenze PCR checked in the ED today, with results currently pending.  Imaging and additional notable ED work-up: EKG shows sinus rhythm with heart rate 74, normal intervals, and no evidence of T wave or ST changes, including no evidence of ST elevation.  Chest x-ray showed no evidence of acute cardiopulmonary process, including evidence of infiltrate, edema, effusion, or pneumothorax.  In order to evaluate the associated headache/right numbness with which the patient originally presented, CT head was pursued and showed suspected small focus of subarachnoid versus intraparenchymal hemorrhage in the right frontal lobe.  Ensuing MRI brain without contrast showed innumerable chronic microhemorrhages in a mixed central and peripheral pattern consistent with amyloid angiopathy, also showing a punctate focus of acute intraparenchymal hemorrhage in the right focal lobe consistent with amyloid angiopathy.  No evidence of cerebral edema or mass-effect.  EDP discussed the patient's case with the on-call neurologist, Dr.Xu, Who recommended overnight observation to the hospitalist service for further evaluation and management of small acute intraparenchymal hemorrhage of the right frontal lobe.  He recommended optimization of blood pressure, with goal systolic blood pressure less than 140 mmHGand recommended holding home full dose aspirin.  Dr.XuAlso recommended and has ordered CT head, which is currently pending, in addition to the following additional laboratory evaluations: CRP, ESR, lipid panel, hemoglobin A1c.  While in the ED, the following were administered: Hydralazine 5 mg IV x1; Ativan 1 mg IV x1 preparation for MRI brain.     Review of  Systems: As per HPI otherwise 10 point review of systems negative.   Past Medical History:  Diagnosis Date   Age-related nuclear cataract of right eye 04/15/2020   Arthritis    Cataract    Colon polyps    Diabetes mellitus ([place])    Gallstones    GERD (gastroesophageal reflux disease)    Hearing loss    right ear   Heart murmur    Hyperlipidemia    Hypertension    Hypothyroidism    Tachycardia    Vitreomacular adhesion of right eye 04/15/2020    Past Surgical History:  Procedure Laterality Date   ABDOMINAL HYSTERECTOMY     x 2   APPENDECTOMY     CHOLECYSTECTOMY     with appendectomy   COLONOSCOPY  12/16/2020   TONSILLECTOMY      Social History:  reports that she has never smoked. She has never used smokeless tobacco. She reports that she does not drink alcohol and does not use drugs.   Allergies  Allergen Reactions   Demerol [Meperidine] Other (See Comments)    Unspecified    Macrobid [Nitrofurantoin Macrocrystal] Other (See Comments)    Unspecified    Macrobid [Nitrofurantoin]     Other reaction(s): hearing loss in R ear   Meclizine Other (See Comments)    Unspecified    Sulfa Antibiotics Other (See Comments)    Unspecified     Family History  Problem Relation Age of Onset   Hypertension Mother        also cerebral hemorrhage   Cerebral aneurysm Mother  also cerebral hemorrhage   Hypertension Father    Diabetes Father        also Alzhemiers   Alzheimer's disease Father    Breast cancer Paternal Grandmother    CAD Maternal Grandfather        Hardening of the arteries   Cerebral aneurysm Maternal Grandfather    Hypertension Sister    Anxiety disorder Sister    Hypertension Son    Colon cancer Neg Hx    Esophageal cancer Neg Hx    Stomach cancer Neg Hx    Rectal cancer Neg Hx    Colon polyps Neg Hx     Family history reviewed and not pertinent    Prior to Admission medications   Medication Sig Start Date End Date Taking? Authorizing  Provider  aspirin 325 MG tablet Take 325 mg by mouth daily.   Yes [provider]  benzonatate (TESSALON) 100 MG capsule Take 100 mg by mouth 3 (three) times daily as needed for cough. 08/03/21  Yes [provider]  calcium carbonate (TUMS EX) 750 MG chewable tablet Chew 1 tablet by mouth as needed for heartburn.   Yes [provider]  carvedilol (COREG) 6.25 MG tablet TAKE ONE AND ONE-HALF IN THE AM AND ONE TABLET IN THE PM 01/12/21  Yes Troy Sine, MD  cholecalciferol (VITAMIN D) 25 MCG (1000 UNIT) tablet 1,000 Units daily. 01/13/16  Yes [provider]  Coenzyme Q10 (CO Q 10) 100 MG CAPS Take 300 mg by mouth daily.   Yes [provider]  dicyclomine (BENTYL) 10 MG capsule Take 1 capsule (10 mg total) by mouth 4 (four) times daily -  before meals and at bedtime. 06/22/21  Yes Thornton Park, MD  empagliflozin (JARDIANCE) 10 MG TABS tablet Take 5 mg by mouth daily.   Yes [provider]  ezetimibe (ZETIA) 10 MG tablet TAKE 1 TABLET DAILY 10/16/20  Yes Troy Sine, MD  famotidine (PEPCID) 20 MG tablet Take 20 mg by mouth daily as needed for heartburn or indigestion. 10/20/18  Yes [provider]  glimepiride (AMARYL) 2 MG tablet Take 2 mg by mouth daily before breakfast.   Yes [provider]  hydrochlorothiazide (HYDRODIURIL) 25 MG tablet TAKE 1 TABLET DAILY 11/04/20  Yes Troy Sine, MD  levothyroxine (SYNTHROID, LEVOTHROID) 137 MCG tablet Take 137 mcg by mouth daily before breakfast. One daily, 1 and 1/2 on Sunday   Yes [provider]  linaGLIPtin-metFORMIN HCl ER 2.01-999 MG TB24 Take 2 tablets by mouth every morning.    Yes [provider]  lisinopril (PRINIVIL,ZESTRIL) 40 MG tablet Take 40 mg by mouth daily.   Yes [provider]  metroNIDAZOLE (METROCREAM) 0.75 % cream Apply 1 application topically daily as needed (rosacea). 01/16/20  Yes [provider]  pantoprazole (PROTONIX)  40 MG tablet Take 1 tablet (40 mg total) by mouth 2 (two) times daily. 06/22/21  Yes Thornton Park, MD     Objective    Physical Exam: Vitals:   08/17/21 2200 08/17/21 2344 08/18/21 0056 08/18/21 0100  BP: (!) 140/56 (!) 173/57  (!) 152/67  Pulse: 63 73  66  Resp: _0 Temp:    97.9 F (36.6 C)  TempSrc:    Oral  SpO2: 98% 97%  98%  Weight:   93 kg   Height:   5' 6" (1.676 m)     General: appears to be stated age; alert, oriented Skin: warm, dry,  no rash Head:  AT/[place] Mouth:  Oral mucosa membranes appear moist, normal dentition Neck: supple; trachea midline Heart:  RRR; did not appreciate any M/R/G Lungs: CTAB, did not appreciate any wheezes, rales, or rhonchi Abdomen: + BS; soft, ND, NT Vascular: 2+ pedal pulses b/l; 2+ radial pulses b/l Extremities: no peripheral edema, no muscle wasting Neuro: strength and sensation intact in upper and lower extremities b/l   Labs on Admission: I have personally reviewed following labs and imaging studies  CBC: Recent Labs  Lab 08/17/21 1555  WBC 8.6  HGB 11.5*  HCT 37.2  MCV 88.4  PLT 546   Basic Metabolic Panel: Recent Labs  Lab 08/17/21 1555  NA 141  K 3.9  CL 109  CO2 24  GLUCOSE 148*  BUN 13  CREATININE 0.82  CALCIUM 9.2   GFR: Estimated Creatinine Clearance: 67.1 mL/min (by C-G formula based on SCr of 0.82 mg/dL). Liver Function Tests: No results for input(s): AST, ALT, ALKPHOS, BILITOT, PROT, ALBUMIN in the last 168 hours. No results for input(s): LIPASE, AMYLASE in the last 168 hours. No results for input(s): AMMONIA in the last 168 hours. Coagulation Profile: No results for input(s): INR, PROTIME in the last 168 hours. Cardiac Enzymes: No results for input(s): CKTOTAL, CKMB, CKMBINDEX, TROPONINI in the last 168 hours. BNP (last 3 results) No results for input(s): PROBNP in the last 8760 hours. HbA1C: No results for input(s): HGBA1C in the last 72 hours. CBG: No results for input(s): GLUCAP  in the last 168 hours. Lipid Profile: No results for input(s): CHOL, HDL, LDLCALC, TRIG, CHOLHDL, LDLDIRECT in the last 72 hours. Thyroid Function Tests: No results for input(s): TSH, T4TOTAL, FREET4, T3FREE, THYROIDAB in the last 72 hours. Anemia Panel: No results for input(s): VITAMINB12, FOLATE, FERRITIN, TIBC, IRON, RETICCTPCT in the last 72 hours. Urine analysis:    Component Value Date/Time   COLORURINE YELLOW 08/19/2020 1231   APPEARANCEUR CLEAR 08/19/2020 1231   LABSPEC 1.015 08/19/2020 1231   PHURINE 5.0 08/19/2020 1231   GLUCOSEU 3+ (A) 08/19/2020 1231   HGBUR NEGATIVE 08/19/2020 1231   KETONESUR NEGATIVE 08/19/2020 1231   PROTEINUR NEGATIVE 08/19/2020 1231    Radiological Exams on Admission: DG Chest 2 View  Result Date: 08/17/2021 CLINICAL DATA:  Chest pain and headache x1 day. EXAM: CHEST - 2 VIEW COMPARISON:  June 01, 2009 FINDINGS: The heart size and mediastinal contours are within normal limits. No focal consolidation. No pleural effusion. No pneumothorax. Thoracic spondylosis. IMPRESSION: No active cardiopulmonary disease. Electronically Signed   By: Dahlia Bailiff M.D.   On: 08/17/2021 16:56   CT Head Wo Contrast  Result Date: 08/17/2021 CLINICAL DATA:  Headache. EXAM: CT HEAD WITHOUT CONTRAST TECHNIQUE: Contiguous axial images were obtained from the base of the skull through the vertex without intravenous contrast. COMPARISON:  None. FINDINGS: Brain: The ventricles and sulci appropriate size for patient's age. The gray-white matter discrimination is preserved. There is a tiny high attenuating focus in the right frontal lobe (sagittal 20/6 and coronal 27/5) suspicious for a small subarachnoid or intraparenchymal hemorrhage. Other etiologies including a thrombosed cortical vein is not excluded. Further evaluation with MRI is recommended. No mass effect or midline shift. Vascular: No hyperdense vessel or unexpected calcification. Skull: No acute calvarial pathology.  Sinuses/Orbits: No acute finding. Other: None IMPRESSION: Probable small focus of subarachnoid or intraparenchymal hemorrhage in the right frontal lobe. Other etiologies including a thrombosed vessel is not excluded. Further evaluation with MRI is recommended. These results were called  by telephone at the time of interpretation on 08/17/2021 at 10:30 pm to provider [facility] , who verbally acknowledged these results. Electronically Signed   By: Anner Crete M.D.   On: 08/17/2021 22:38   MR BRAIN WO CONTRAST  Result Date: 08/18/2021 CLINICAL DATA:  Headache impossible intracranial hemorrhage EXAM: MRI HEAD WITHOUT CONTRAST TECHNIQUE: Multiplanar, multiecho pulse sequences of the brain and surrounding structures were obtained without intravenous contrast. COMPARISON:  None. FINDINGS: Brain: Punctate focus of acute hemorrhage in the right frontal lobe is better demonstrated on the earlier head CT. There are innumerable chronic microhemorrhages scattered throughout the brain in a mixed central and peripheral pattern, most likely indicating amyloid angiopathy. There is multifocal hyperintense T2-weighted signal within the white matter. Parenchymal volume and CSF spaces are normal. The midline structures are normal. Vascular: Major flow voids are preserved. Skull and upper cervical spine: Normal calvarium and skull base. Visualized upper cervical spine and soft tissues are normal. Sinuses/Orbits:No paranasal sinus fluid levels or advanced mucosal thickening. No mastoid or middle ear effusion. Normal orbits. IMPRESSION: 1. Innumerable chronic microhemorrhages in a mixed central and peripheral pattern, consistent with amyloid angiopathy. 2. Punctate focus of acute hemorrhage in the right frontal lobe, better demonstrated on the earlier head CT. Peripheral intraparenchymal hemorrhage is in keeping with amyloid angiopathy. Electronically Signed   By: Ulyses Jarred M.D.   On: 08/18/2021 00:52     EKG:  Independently reviewed, with result as described above.    Assessment/Plan   Principal Problem:   Intraparenchymal hematoma of brain Active Problems:   Hypertension   Diabetes mellitus without complication (HCC)   Hyperlipidemia with target LDL less than 70   Gastroesophageal reflux disease without esophagitis   Intracerebral hemorrhage   Cerebral amyloid angiopathy (CODE)   Atypical chest pain   Chronic anemia      #) Acute intraparenchymal hemorrhage of the right frontal lobe: In setting of associated acute headache with new onset numbness into the right upper extremity, CT head showed evidence of small focus of intracranial hemorrhage in the right frontal lobe, with ensuing MRI confirming punctate focus of right intraparenchymal hemorrhage in the right frontal lobe consistent with amyloid angiopathy, also demonstrating innumerable chronic microhemorrhages in mixed central/peripheral pattern, consistent with chronic amyloid angiopathy.  Headache and right upper extremity numbness have spontaneously, without recurrence, and no evidence of acute focal neurologic deficits on exam at this time.  Neurology consulted, as above, with recommendations for repeat observation to the hospitalist service for further evaluation management of the above, including repeat CT head , blood pressure control, with goal systolic blood pressure less than 140 mmHg, holding home aspirin, as well as additional laboratory evaluation, as further detailed below.  Plan: Every 4 hour neurochecks x4 occurrences ordered.  Monitor on telemetry, including for evidence of worsening hypertension as well as bradycardia that could be suggestive of Cushing's triad of increasing intracranial pressure.  Further optimization of antihypertensive control, with goal systolic blood pressure less than 140.  Home HCTZ and lisinopril have been reordered, and neurologist as further optimized home Coreg dose.  Prn IV hydralazine for slight  blood pressure than 140 mmHg.  Hold home full dose aspirin.  Neurology formally consulted.  Repeat CT head ordered.  ESR, CRP, hemoglobin A1c, lipid panel, per neurology.  Check INR.  Repeat CBC in the morning.  Refraining from pharmacologic DVT prophylaxis.  SCDs.      #) Cerebral amyloid angiopathy: CT MRI brain findings consistent with amyloid angiopathy, as  above, including the aforementioned acute intraparenchymal hemorrhage in the right frontal lobe as well as findings of innumerable chronic micro hemorrhages and mixed central/peripheral pattern consistent with amyloid angiopathy.   Plan: Further evaluation and treatment of antihypertensive management, as above, including systolic blood pressure goal less than 140 mmHg.  Hold home full dose aspirin.  Repeat CT head ordered, as above.       #) Atypical chest pain: Patient initially presented to ED complaining of sharp, substernal chest pain that was nonexertional, and spontaneously resolved in the absence of any nitroglycerin to evaluate her response to this medication.  ACS felt to be less likely, including serial troponin enzyme nonelevated x2, while presenting EKG showed no evidence of acute ischemic changes patient has remained chest pain-free throughout her ED course, discomfort, evaluation of patient this evening . no known history of underlying coronary disease.  EDP plan, to which the patient is amenable, is for outpatient follow-up with cardiology for further assessment additional stress testing.   Plan: Monitor on Telemetry overnight, moreso front the standpoint of evaluation/management of acute intracranial parenchymal hemorrhage counseled the patient on the importance of attending outpatient cardiology follow-up.        #) Chronic anemia: Documented history of such, with baseline hemoglobin range noted to be 10-12, with presenting CBC reflecting hemoglobin consistent with this range.  Acute intraparenchymal hemorrhage of the  right frontal lobe along with chronic microhemorrhages noted on imaging of the brain seen, as above.  Otherwise, no evidence of acute bleeding.  Patient appears hemodynamically stable.  She is on a full dose aspirin as outpatient, with updated instructions to discontinue this medication and laid her acute intraparenchymal hemorrhage.  Repeat CBC in the morning.  Discontinue home aspirin.  Check INR.      #) Essential Hypertension: documented h/o such, with outpatient antihypertensive regimen including lisinopril 40 mg p.o. daily, HCTZ 25 mg p.o. daily, Coreg 9.375 QAM and 6.25 QHS.systolic blood pressures in the ED today in the 130s to 150s.  Updated goal systolic blood pressure of less than 140, per neurology instructions in the setting new finding of acute intraparenchymal hemorrhage, as above.  Neurology also advised further the patient's regular treatment, increasing Coreg to 12.5 mg p.o. twice daily, with next dose to occur in the morning.  I will also provide a single dose of Coreg to serve as a bridging mechanism giving patient had missed her scheduled Coreg earlier this evening.   Plan: Close monitoring of subsequent BP via routine VS. continuing lisinopril and HCTZ.  Continue increased dose of Coreg 12.5 mg p.o. twice daily.  Coreg 3.125 mg p.o. x1 dose now, as above.  Monitor on telemetry.      #) Type 2 Diabetes Mellitus: documented history of such. Home insulin regimen: None. Home oral hypoglycemic agents: on empagliflozin, glimepiride, linagliptin, and metformin.. presenting blood sugar: 148. Most recent A1c noted to be 6.7% in February 2021.   Plan: accuchecks QAC and HS with low dose SSI. hold home oral hypoglycemic agents during this hospitalization.       #) GERD: documented h/o such; on famotidine and Protonix as outpatient.   Plan: continue home PPI and H2 blocker.        #) Hyperlipidemia: documented h/o such. On Zetia as outpatient.    Plan: continue home  Zetia.         #) acquired hypothyroidism: documented h/o such, on Synthroid as outpatient.   Plan: cont home Synthroid.  DVT prophylaxis: SCD's   Code Status: Full code Family Communication: none Disposition Plan: Per Rounding Team Consults called: Neurologist, Dr.Xu, Consulted, as further detailed above;  Admission status: Observation;   PLEASE NOTE THAT DRAGON DICTATION SOFTWARE WAS USED IN THE CONSTRUCTION OF THIS NOTE.   [place] DO [facility]  From [place]   08/18/2021, 2:02 AM

## 2021-08-18 NOTE — ED Provider Notes (Signed)
Patient signed out pending MRI.  Initially presented with some complaints of chest discomfort; however had also complained of a headache that was somewhat atypical.  Had a screening CT scan that was concerning for potential punctate hemorrhage.  MRI obtained per radiology recommendations.  MRI reviewed.  MRI shows chronic microvascular hemorrhaging in the distribution concerning for amyloid angiopathy.  She does have an acute area of hemorrhage in the frontal lobe.  See clinical course above.  Patient is neurologically intact on my exam.  She does state that over the last 1 to 2 years she has noted some increasing gait disturbance and balance issues.  Otherwise denies any prior strokelike symptoms.   Physical Exam  BP (!) 152/67   Pulse 66   Temp 97.9 F (36.6 C) (Oral)   Resp 15   Ht 1.676 m (5\' 6" )   Wt 93 kg   SpO2 98%   BMI 33.09 kg/m   Physical Exam Awake, alert, no acute distress ED Course/Procedures   Clinical Course as of 08/18/21 0155  Tue Aug 18, 2021  0138 Spoke with Dr. Erlinda Hong, neurology.  He will assess the patient.  He has reviewed MRI images and agrees that there are multiple innumerable areas of chronic hemorrhage.  There does appear to be an acute hemorrhage.  He states in this scenario, would optimize blood pressure control with a systolic less than 157.  She is neurologically intact.  Recommends repeat CT scan to ensure no progression of hemorrhage.  For this reason, will admit patient for observation blood pressure control. [CH]  0141 Also recommends to hold aspirin. [CH]    Clinical Course User Index [CH] Davan Hark, Barbette Hair, MD    Procedures  MDM    Problem List Items Addressed This Visit   None Visit Diagnoses     Nonintractable headache, unspecified chronicity pattern, unspecified headache type    -  Primary   Relevant Medications   acetaminophen (TYLENOL) tablet 650 mg   acetaminophen (TYLENOL) suppository 650 mg   Acute chest pain       Intracranial  hemorrhage (HCC)       Cerebral amyloid angiopathy (HCC)       Relevant Medications   hydrALAZINE (APRESOLINE) injection 5 mg            Merryl Hacker, MD 08/18/21 0202

## 2021-08-18 NOTE — Discharge Summary (Signed)
Physician Discharge Summary  Pamela [name] UUV:[id number] DOB: Mar 06, 1946 DOA: 08/17/2021  PCP: Reynold Bowen, MD  Admit date: 08/17/2021 Discharge date: 08/18/2021  Admitted From: Home Disposition: Home  Recommendations for Outpatient Follow-up:  Follow up with PCP in 1-2 weeks Please obtain BMP/CBC in one week Please follow up with neurology as scheduled  Home Health: None Equipment/Devices: None  Discharge Condition: Stable CODE STATUS: Full Diet recommendation: Low-salt low-fat diabetic diet  Brief/Interim Summary:  Pamela [name] is a 75 y.o. female with medical history significant for type 2 diabetes mellitus, GERD, hypertension, hyperlipidemia, chronic anemia with baseline hemoglobin 10-12, who is admitted to [facility] on 08/17/2021 with small acute intra parenchymal hemorrhage of the right frontal lobe after presenting from home to [facility] ED complaining of chest pain   Assessment & Plan:   Atypical CP, ACS ruled out Troponin negative, EKG normal sinus rhythm without ST changes, outpatient follow-up as indicated    Acute intraparenchymal hemorrhage of the right frontal lobe, POA Questionably secondary to cerebral amyloid angiopathy Neuro following, appreciate insight recommendations, recommending tight blood pressure control to reduce risk of future bleeds Neuro recommending to hold aspirin 81 mg for 1 week then resume, after further discussion with patient she states she has had episodes of red stool in the past while on aspirin, we discussed discontinuing aspirin completely and following up outpatient for further recommendations in the setting of questionable amyloid angiopathy as well as questionable episodes of GI bleeding although hemoglobin here remained stable, albeit slightly anemic   Chronic anemia -Unclear etiology, patient reports occasional bright red or maroon stool, recommend continuing to hold aspirin as above  HTN Continue carvedilol at new higher  dose, amlodipine continue home HCTZ   NIDDM2, questionably uncontrolled -A1c here 6.7, she notes outpatient A1c just last month 6.4, defer to PCP for ongoing medical treatment and medication adjustments, no changes here  GERD-continue PPI  HLD-continue statin  Hypothyroidism-continue levothyroxine  Discharge Instructions   Allergies as of 08/18/2021       Reactions   Demerol [meperidine] Other (See Comments)   Unspecified   Macrobid [nitrofurantoin Macrocrystal] Other (See Comments)   Unspecified   Macrobid [nitrofurantoin]    Other reaction(s): hearing loss in R ear   Meclizine Other (See Comments)   Unspecified   Sulfa Antibiotics Other (See Comments)   Unspecified        Medication List     TAKE these medications    amLODipine 5 MG tablet Commonly known as: NORVASC Take 1 tablet (5 mg total) by mouth daily.   benzonatate 100 MG capsule Commonly known as: TESSALON Take 100 mg by mouth 3 (three) times daily as needed for cough.   calcium carbonate 750 MG chewable tablet Commonly known as: TUMS EX Chew 1 tablet by mouth as needed for heartburn.   carvedilol 12.5 MG tablet Commonly known as: COREG Take 1 tablet (12.5 mg total) by mouth 2 (two) times daily with a meal. What changed:  medication strength how much to take how to take this when to take this additional instructions   cholecalciferol 25 MCG (1000 UNIT) tablet Commonly known as: VITAMIN D 1,000 Units daily.   Co Q 10 100 MG Caps Take 300 mg by mouth daily.   dicyclomine 10 MG capsule Commonly known as: BENTYL Take 1 capsule (10 mg total) by mouth 4 (four) times daily -  before meals and at bedtime.   ezetimibe 10 MG tablet Commonly known as: ZETIA TAKE 1  TABLET DAILY   famotidine 20 MG tablet Commonly known as: PEPCID Take 20 mg by mouth daily as needed for heartburn or indigestion.   glimepiride 2 MG tablet Commonly known as: AMARYL Take 2 mg by mouth daily before breakfast.    hydrochlorothiazide 25 MG tablet Commonly known as: HYDRODIURIL TAKE 1 TABLET DAILY   Jardiance 10 MG Tabs tablet Generic drug: empagliflozin Take 5 mg by mouth daily.   levothyroxine 137 MCG tablet Commonly known as: SYNTHROID Take 137 mcg by mouth daily before breakfast. One daily, 1 and 1/2 on Sunday   linaGLIPtin-metFORMIN HCl ER 2.01-999 MG Tb24 Take 2 tablets by mouth every morning.   lisinopril 40 MG tablet Commonly known as: ZESTRIL Take 40 mg by mouth daily.   metroNIDAZOLE 0.75 % cream Commonly known as: METROCREAM Apply 1 application topically daily as needed (rosacea).   pantoprazole 40 MG tablet Commonly known as: PROTONIX Take 1 tablet (40 mg total) by mouth 2 (two) times daily.        Follow-up Information     Schedule an appointment as soon as possible for a visit  with Reynold Bowen, MD.   Specialty: Endocrinology Contact information: [place] [address] [phone]         Schedule an appointment as soon as possible for a visit  with [place].   Contact information: [place] [phone] [phone]               Allergies  Allergen Reactions   Demerol [Meperidine] Other (See Comments)    Unspecified    Macrobid [Nitrofurantoin Macrocrystal] Other (See Comments)    Unspecified    Macrobid [Nitrofurantoin]     Other reaction(s): hearing loss in R ear   Meclizine Other (See Comments)    Unspecified    Sulfa Antibiotics Other (See Comments)    Unspecified     Consultations: Neurology  Procedures/Studies: DG Chest 2 View  Result Date: 08/17/2021 CLINICAL DATA:  Chest pain and headache x1 day. EXAM: CHEST - 2 VIEW COMPARISON:  June 01, 2009 FINDINGS: The heart size and mediastinal contours are within normal limits. No focal consolidation. No pleural effusion. No pneumothorax. Thoracic spondylosis.  IMPRESSION: No active cardiopulmonary disease. Electronically Signed   By: Dahlia Bailiff M.D.   On: 08/17/2021 16:56   CT Head Wo Contrast  Result Date: 08/17/2021 CLINICAL DATA:  Headache. EXAM: CT HEAD WITHOUT CONTRAST TECHNIQUE: Contiguous axial images were obtained from the base of the skull through the vertex without intravenous contrast. COMPARISON:  None. FINDINGS: Brain: The ventricles and sulci appropriate size for patient's age. The gray-white matter discrimination is preserved. There is a tiny high attenuating focus in the right frontal lobe (sagittal 20/6 and coronal 27/5) suspicious for a small subarachnoid or intraparenchymal hemorrhage. Other etiologies including a thrombosed cortical vein is not excluded. Further evaluation with MRI is recommended. No mass effect or midline shift. Vascular: No hyperdense vessel or unexpected calcification. Skull: No acute calvarial pathology. Sinuses/Orbits: No acute finding. Other: None IMPRESSION: Probable small focus of subarachnoid or intraparenchymal hemorrhage in the right frontal lobe. Other etiologies including a thrombosed vessel is not excluded. Further evaluation with MRI is recommended. These results were called by telephone at the time of interpretation on 08/17/2021 at 10:30 pm to provider [facility] , who verbally acknowledged these results. Electronically Signed   By: Anner Crete M.D.   On: 08/17/2021 22:38   MR  BRAIN WO CONTRAST  Result Date: 08/18/2021 CLINICAL DATA:  Headache impossible intracranial hemorrhage EXAM: MRI HEAD WITHOUT CONTRAST TECHNIQUE: Multiplanar, multiecho pulse sequences of the brain and surrounding structures were obtained without intravenous contrast. COMPARISON:  None. FINDINGS: Brain: Punctate focus of acute hemorrhage in the right frontal lobe is better demonstrated on the earlier head CT. There are innumerable chronic microhemorrhages scattered throughout the brain in a mixed central and peripheral pattern,  most likely indicating amyloid angiopathy. There is multifocal hyperintense T2-weighted signal within the white matter. Parenchymal volume and CSF spaces are normal. The midline structures are normal. Vascular: Major flow voids are preserved. Skull and upper cervical spine: Normal calvarium and skull base. Visualized upper cervical spine and soft tissues are normal. Sinuses/Orbits:No paranasal sinus fluid levels or advanced mucosal thickening. No mastoid or middle ear effusion. Normal orbits. IMPRESSION: 1. Innumerable chronic microhemorrhages in a mixed central and peripheral pattern, consistent with amyloid angiopathy. 2. Punctate focus of acute hemorrhage in the right frontal lobe, better demonstrated on the earlier head CT. Peripheral intraparenchymal hemorrhage is in keeping with amyloid angiopathy. Electronically Signed   By: Ulyses Jarred M.D.   On: 08/18/2021 00:52     Subjective: No acute issues or events overnight, headache chest pain and other symptoms have resolved, otherwise stable and agreeable for discharge home   Discharge Exam: Vitals:   08/18/21 1400 08/18/21 1435  BP: (!) 152/62 (!) 142/53  Pulse: 74 69  Resp: 18 16  Temp:    SpO2: 98% 98%   Vitals:   08/18/21 1300 08/18/21 1315 08/18/21 1400 08/18/21 1435  BP: (!) 149/117 138/78 (!) 152/62 (!) 142/53  Pulse: 68 71 74 69  Resp: 13 12 18 16   Temp:      TempSrc:      SpO2: 99% 98% 98% 98%  Weight:      Height:        General: Pt is alert, awake, not in acute distress Cardiovascular: RRR, S1/S2 +, no rubs, no gallops Respiratory: CTA bilaterally, no wheezing, no rhonchi Abdominal: Soft, NT, ND, bowel sounds + Extremities: no edema, no cyanosis    The results of significant diagnostics from this hospitalization (including imaging, microbiology, ancillary and laboratory) are listed below for reference.     Microbiology: Recent Results (from the past 240 hour(s))  Resp Panel by RT-PCR (Flu A&B, Covid)  Nasopharyngeal Swab     Status: None   Collection Time: 08/18/21  2:25 AM   Specimen: Nasopharyngeal Swab; Nasopharyngeal(NP) swabs in vial transport medium  Result Value Ref Range Status   SARS Coronavirus 2 by RT PCR NEGATIVE NEGATIVE Final    Comment: (NOTE) SARS-CoV-2 target nucleic acids are NOT DETECTED.  The SARS-CoV-2 RNA is generally detectable in upper respiratory specimens during the acute phase of infection. The lowest concentration of SARS-CoV-2 viral copies this assay can detect is 138 copies/mL. A negative result does not preclude SARS-Cov-2 infection and should not be used as the sole basis for treatment or other patient management decisions. A negative result may occur with  improper specimen collection/handling, submission of specimen other than nasopharyngeal swab, presence of viral mutation(s) within the areas targeted by this assay, and inadequate number of viral copies(<138 copies/mL). A negative result must be combined with clinical observations, patient history, and epidemiological information. The expected result is Negative.  Fact Sheet for Patients:  EntrepreneurPulse.com.au  Fact Sheet for Healthcare Providers:  IncredibleEmployment.be  This test is no t yet approved or cleared by the [place]  FDA and  has been authorized for detection and/or diagnosis of SARS-CoV-2 by FDA under an Emergency Use Authorization (EUA). This EUA will remain  in effect (meaning this test can be used) for the duration of the COVID-19 declaration under Section 564(b)(1) of the Act, 21 U.S.C.section 360bbb-3(b)(1), unless the authorization is terminated  or revoked sooner.       Influenza A by PCR NEGATIVE NEGATIVE Final   Influenza B by PCR NEGATIVE NEGATIVE Final    Comment: (NOTE) The Xpert Xpress SARS-CoV-2/FLU/RSV plus assay is intended as an aid in the diagnosis of influenza from Nasopharyngeal swab specimens and should not be  used as a sole basis for treatment. Nasal washings and aspirates are unacceptable for Xpert Xpress SARS-CoV-2/FLU/RSV testing.  Fact Sheet for Patients: EntrepreneurPulse.com.au  Fact Sheet for Healthcare Providers: IncredibleEmployment.be  This test is not yet approved or cleared by the [place] FDA and has been authorized for detection and/or diagnosis of SARS-CoV-2 by FDA under an Emergency Use Authorization (EUA). This EUA will remain in effect (meaning this test can be used) for the duration of the COVID-19 declaration under Section 564(b)(1) of the Act, 21 U.S.C. section 360bbb-3(b)(1), unless the authorization is terminated or revoked.  Performed at [facility], [place] [address] [place], [place] [address]      Labs: BNP (last 3 results) No results for input(s): BNP in the last 8760 hours. Basic Metabolic Panel: Recent Labs  Lab 08/17/21 1555 08/18/21 0234  NA 141 139  K 3.9 3.6  CL 109 106  CO2 24 23  GLUCOSE 148* 94  BUN 13 12  CREATININE 0.82 0.78  CALCIUM 9.2 8.9  MG  --  1.4*   Liver Function Tests: Recent Labs  Lab 08/18/21 0234  AST 16  ALT 12  ALKPHOS 66  BILITOT 0.7  PROT 6.0*  ALBUMIN 3.2*   No results for input(s): LIPASE, AMYLASE in the last 168 hours. No results for input(s): AMMONIA in the last 168 hours. CBC: Recent Labs  Lab 08/17/21 1555 08/18/21 0234  WBC 8.6 9.1  HGB 11.5* 11.0*  HCT 37.2 35.6*  MCV 88.4 87.9  PLT 304 278   Cardiac Enzymes: No results for input(s): CKTOTAL, CKMB, CKMBINDEX, TROPONINI in the last 168 hours. BNP: Invalid input(s): POCBNP CBG: Recent Labs  Lab 08/18/21 0825 08/18/21 1230  GLUCAP 134* 224*   D-Dimer No results for input(s): DDIMER in the last 72 hours. Hgb A1c Recent Labs    08/18/21 0234  HGBA1C 6.7*   Lipid Profile Recent Labs    08/18/21 0234  CHOL 164  HDL 59  LDLCALC 89  TRIG 79  CHOLHDL 2.8   Thyroid function  studies No results for input(s): TSH, T4TOTAL, T3FREE, THYROIDAB in the last 72 hours.  Invalid input(s): FREET3 Anemia work up No results for input(s): VITAMINB12, FOLATE, FERRITIN, TIBC, IRON, RETICCTPCT in the last 72 hours. Urinalysis    Component Value Date/Time   COLORURINE YELLOW 08/19/2020 1231   APPEARANCEUR CLEAR 08/19/2020 1231   LABSPEC 1.015 08/19/2020 1231   PHURINE 5.0 08/19/2020 1231   GLUCOSEU 3+ (A) 08/19/2020 1231   HGBUR NEGATIVE 08/19/2020 1231   KETONESUR NEGATIVE 08/19/2020 1231   PROTEINUR NEGATIVE 08/19/2020 1231   Sepsis Labs Invalid input(s): PROCALCITONIN,  WBC,  LACTICIDVEN Microbiology Recent Results (from the past 240 hour(s))  Resp Panel by RT-PCR (Flu A&B, Covid) Nasopharyngeal Swab     Status: None   Collection Time: 08/18/21  2:25 AM  Specimen: Nasopharyngeal Swab; Nasopharyngeal(NP) swabs in vial transport medium  Result Value Ref Range Status   SARS Coronavirus 2 by RT PCR NEGATIVE NEGATIVE Final    Comment: (NOTE) SARS-CoV-2 target nucleic acids are NOT DETECTED.  The SARS-CoV-2 RNA is generally detectable in upper respiratory specimens during the acute phase of infection. The lowest concentration of SARS-CoV-2 viral copies this assay can detect is 138 copies/mL. A negative result does not preclude SARS-Cov-2 infection and should not be used as the sole basis for treatment or other patient management decisions. A negative result may occur with  improper specimen collection/handling, submission of specimen other than nasopharyngeal swab, presence of viral mutation(s) within the areas targeted by this assay, and inadequate number of viral copies(<138 copies/mL). A negative result must be combined with clinical observations, patient history, and epidemiological information. The expected result is Negative.  Fact Sheet for Patients:  EntrepreneurPulse.com.au  Fact Sheet for Healthcare Providers:   IncredibleEmployment.be  This test is no t yet approved or cleared by the [place] FDA and  has been authorized for detection and/or diagnosis of SARS-CoV-2 by FDA under an Emergency Use Authorization (EUA). This EUA will remain  in effect (meaning this test can be used) for the duration of the COVID-19 declaration under Section 564(b)(1) of the Act, 21 U.S.C.section 360bbb-3(b)(1), unless the authorization is terminated  or revoked sooner.       Influenza A by PCR NEGATIVE NEGATIVE Final   Influenza B by PCR NEGATIVE NEGATIVE Final    Comment: (NOTE) The Xpert Xpress SARS-CoV-2/FLU/RSV plus assay is intended as an aid in the diagnosis of influenza from Nasopharyngeal swab specimens and should not be used as a sole basis for treatment. Nasal washings and aspirates are unacceptable for Xpert Xpress SARS-CoV-2/FLU/RSV testing.  Fact Sheet for Patients: EntrepreneurPulse.com.au  Fact Sheet for Healthcare Providers: IncredibleEmployment.be  This test is not yet approved or cleared by the [place] FDA and has been authorized for detection and/or diagnosis of SARS-CoV-2 by FDA under an Emergency Use Authorization (EUA). This EUA will remain in effect (meaning this test can be used) for the duration of the COVID-19 declaration under Section 564(b)(1) of the Act, 21 U.S.C. section 360bbb-3(b)(1), unless the authorization is terminated or revoked.  Performed at [facility], [place] [address] [place], [place] [address]      Time coordinating discharge: Over 30 minutes  SIGNED:   Little Ishikawa, DO [facility] 08/18/2021, 3:00 PM Pager   If 7PM-7AM, please contact night-coverage [email or web address]

## 2021-08-18 NOTE — Progress Notes (Addendum)
STROKE TEAM PROGRESS NOTE   INTERVAL HISTORY Patient is seen in her room with no family at the bedside.  She was admitted overnight with an acute, severe headache.  CT revealed a tiny right frontal ICH, and MRI revealed numerous microhemorrhages, consistent with CAA.  She has been hemodynamically stable, her neurological exam has been stable and she has had no acute events overnight.  Headaches have resolved and patient will be admitted for treatment of [place].  SBP should be kept <140. Patient denies any prior history of memory loss or cognitive impairment, strokes or TIAs.  She does have a constant history of hypertension which she states is not fully controlled.  She has no complaints today.  Blood pressure adequately controlled. Vitals:   08/18/21 1245 08/18/21 1300 08/18/21 1315 08/18/21 1400  BP: 138/70 (!) 149/117 138/78 (!) 152/62  Pulse: 74 68 71 74  Resp: 14 13 12 18   Temp:      TempSrc:      SpO2: 98% 99% 98% 98%  Weight:      Height:       CBC:  Recent Labs  Lab 08/17/21 1555 08/18/21 0234  WBC 8.6 9.1  HGB 11.5* 11.0*  HCT 37.2 35.6*  MCV 88.4 87.9  PLT 304 850   Basic Metabolic Panel:  Recent Labs  Lab 08/17/21 1555 08/18/21 0234  NA 141 139  K 3.9 3.6  CL 109 106  CO2 24 23  GLUCOSE 148* 94  BUN 13 12  CREATININE 0.82 0.78  CALCIUM 9.2 8.9  MG  --  1.4*   Lipid Panel:  Recent Labs  Lab 08/18/21 0234  CHOL 164  TRIG 79  HDL 59  CHOLHDL 2.8  VLDL 16  LDLCALC 89   HgbA1c:  Recent Labs  Lab 08/18/21 0234  HGBA1C 6.7*   Urine Drug Screen: No results for input(s): LABOPIA, COCAINSCRNUR, LABBENZ, AMPHETMU, THCU, LABBARB in the last 168 hours.  Alcohol Level No results for input(s): ETH in the last 168 hours.  IMAGING past 24 hours DG Chest 2 View  Result Date: 08/17/2021 CLINICAL DATA:  Chest pain and headache x1 day. EXAM: CHEST - 2 VIEW COMPARISON:  June 01, 2009 FINDINGS: The heart size and mediastinal contours are within normal limits. No  focal consolidation. No pleural effusion. No pneumothorax. Thoracic spondylosis. IMPRESSION: No active cardiopulmonary disease. Electronically Signed   By: Dahlia Bailiff M.D.   On: 08/17/2021 16:56   CT Head Wo Contrast  Result Date: 08/17/2021 CLINICAL DATA:  Headache. EXAM: CT HEAD WITHOUT CONTRAST TECHNIQUE: Contiguous axial images were obtained from the base of the skull through the vertex without intravenous contrast. COMPARISON:  None. FINDINGS: Brain: The ventricles and sulci appropriate size for patient's age. The gray-white matter discrimination is preserved. There is a tiny high attenuating focus in the right frontal lobe (sagittal 20/6 and coronal 27/5) suspicious for a small subarachnoid or intraparenchymal hemorrhage. Other etiologies including a thrombosed cortical vein is not excluded. Further evaluation with MRI is recommended. No mass effect or midline shift. Vascular: No hyperdense vessel or unexpected calcification. Skull: No acute calvarial pathology. Sinuses/Orbits: No acute finding. Other: None IMPRESSION: Probable small focus of subarachnoid or intraparenchymal hemorrhage in the right frontal lobe. Other etiologies including a thrombosed vessel is not excluded. Further evaluation with MRI is recommended. These results were called by telephone at the time of interpretation on 08/17/2021 at 10:30 pm to provider [facility] , who verbally acknowledged these results. Electronically Signed  By: Anner Crete M.D.   On: 08/17/2021 22:38   MR BRAIN WO CONTRAST  Result Date: 08/18/2021 CLINICAL DATA:  Headache impossible intracranial hemorrhage EXAM: MRI HEAD WITHOUT CONTRAST TECHNIQUE: Multiplanar, multiecho pulse sequences of the brain and surrounding structures were obtained without intravenous contrast. COMPARISON:  None. FINDINGS: Brain: Punctate focus of acute hemorrhage in the right frontal lobe is better demonstrated on the earlier head CT. There are innumerable chronic  microhemorrhages scattered throughout the brain in a mixed central and peripheral pattern, most likely indicating amyloid angiopathy. There is multifocal hyperintense T2-weighted signal within the white matter. Parenchymal volume and CSF spaces are normal. The midline structures are normal. Vascular: Major flow voids are preserved. Skull and upper cervical spine: Normal calvarium and skull base. Visualized upper cervical spine and soft tissues are normal. Sinuses/Orbits:No paranasal sinus fluid levels or advanced mucosal thickening. No mastoid or middle ear effusion. Normal orbits. IMPRESSION: 1. Innumerable chronic microhemorrhages in a mixed central and peripheral pattern, consistent with amyloid angiopathy. 2. Punctate focus of acute hemorrhage in the right frontal lobe, better demonstrated on the earlier head CT. Peripheral intraparenchymal hemorrhage is in keeping with amyloid angiopathy. Electronically Signed   By: Ulyses Jarred M.D.   On: 08/18/2021 00:52    PHYSICAL EXAM General: Alert, well-developed, well-nourished elderly Caucasian lady patient in no acute distress. . Afebrile. Head is nontraumatic. Neck is supple without bruit.    Cardiac exam no murmur or gallop. Lungs are clear to auscultation. Distal pulses are well felt.    NEURO:  Mental Status: AA&Ox3 Registration 3/3, recall 3/3.  When asked to name animals with 4 feet, patient is able to name 13 of them. Speech/Language: speech is without dysarthria or aphasia.  Naming, repetition, fluency, and comprehension intact.  Cranial Nerves:  II: PERRL. Visual fields full. Color vision intact. Snellen OD:         OS:         OU:  III, IV, VI: EOMI. Eyelids elevate symmetrically.  V: Sensation is intact to light touch and symmetrical to face.  VII: Smile is symmetrical. VIII: hearing intact to voice. IX, X: Phonation is normal. . XII: tongue is midline without fasciculations. Motor: 5/5 strength to all muscle groups tested.  Sensation-  Intact to light touch bilaterally. Extinction absent to light touch to DSS.  Coordination: FTN intact bilaterally.No drift.  Gait- deferred   ASSESSMENT/PLAN Ms. LARYSA PALL is a 75 y.o. female with history of HTN, DM and hearing loss in right ear presenting with severe headaches. CT revealed a tiny right frontal ICH, and MRI revealed numerous microhemorrhages, consistent with CAA. SBP goal will be <140 given CAA, and patient will need further cognitive testing as an outpatient.   ICH: Small right frontal parenchymal ICH secondary to CAA CT head Small IPH in right frontal lobe CTA pending MRI  Acute small hemorrhage in right frontal lobe, innumerable chronic microhemorrhages consistent with CAA versus hypertensive microangiopathy 2D Echo pending LDL 89 HgbA1c 6.7 VTE prophylaxis - SCDs    Diet   Diet regular Room service appropriate? Yes; Fluid consistency: Thin   No antithrombotic prior to admission, now on No antithrombotic. Secondary to [place] and CAA Therapy recommendations:  pending Disposition:  pending  Hypertension Home meds:  Coreg 12.5 mg BID, HCTZ 25 mg daily, lisinopril 40 mg daily, resumed in hospital Stable SBP goal <140 given CAA Add amlodipine 5 mg daily to achieve better BP control Long-term BP goal normotensive  Hyperlipidemia Home meds:  zetia 10 mg daily, resumed in hospital LDL 89, goal < 70 High intensity statin deferred due to CAA Continue statin at discharge  Diabetes type II Controlled Home meds:  Jardiance 10 mg daily, linagliptin/metformin 2.01/999 2 tabs daily HgbA1c 6.7, goal < 7.0 CBGs Recent Labs    08/18/21 0825 08/18/21 1230  GLUCAP 134* 224*    SSI  Other Stroke Risk Factors Advanced Age >/= 72  Obesity, Body mass index is 33.09 kg/m., BMI >/= 30 associated with increased stroke risk, recommend weight loss, diet and exercise as appropriate    Other Active Problems none  Hospital day # [place] , MSN,  AGACNP-BC [facility] See Amion for schedule and pager information 08/18/2021 2:23 PM  STROKE MD NOTE : I have personally obtained history,examined this patient, reviewed notes, independently viewed imaging studies, participated in medical decision making and plan of care.ROS completed by me personally and pertinent positives fully documented  I have made any additions or clarifications directly to the above note. Agree with note above.  Patient presented with multifocal symptoms of chest pain, palpitations, right hand numbness and headache and MRI scan likely shows a small incidental right frontal cortical hemorrhage.  Gradient-echo sequences showing numerous microhemorrhages in both supratentorial and infratentorial compartments raising strong suspicion for amyloid angiopathy though hypertensive microangiopathic disease is also possible.  Recommend strict blood pressure control with goal below 130/90.  Hold aspirin for now for a week and then resume at 81 mg daily dose.  Continue ongoing stroke work-up.  Long discussion with the patient and Dr. Avon Gully and answered questions.  Greater than 50% time during this 35-minute visit was spent on counseling and coordination of care about stroke and intracerebral hemorrhage and amyloid angiopathy and answering questions.  Follow-up with outpatient stroke clinic in 2 months  Antony Contras, MD Medical Director [facility] Pager: [phone] 08/18/2021 2:27 PM   To contact Stroke Continuity provider, please refer to [email or web address] After hours, contact General Neurology

## 2021-08-18 NOTE — Consult Note (Addendum)
Stroke Neurology Consultation Note  Consult Requested by: Dr. Dina Rich  Reason for Consult: small ICH with likely CAA  Consult Date: 08/18/21   The history was obtained from the pt.  During history and examination, all items were able to obtain unless otherwise noted.  History of Present Illness:  Pamela [name] is a 75 y.o. Caucasian female with PMH of HTN, DM, right ear hearing loss presented to ED for HA and CP.  Per patient and her husband, she had acute onset headache around 12 PM.  Headache was at top of the head, sharp pain, no photophobia, phonophobia or nausea vomiting.  About 5 to 10 minutes later, she started have chest pain, heart palpitation and bilateral jaw pain and bottom half toothache, and right hand numbness.  Over time, the pain getting better, headache resolved in 2 to 3 hours, chest pain and jaw pain resolved in 4-5 hours.  Currently patient asymptomatic.  EKG unremarkable and troponin level negative.  Due to headache, CT head was done showed right frontal tiny ICH.  MRI head then completed showed numerous microhemorrhages, consistent with CAA.  Neurology was consulted.  Patient denies any speech difficulty, arm or leg weakness, vision changes at this time.  Per patient, she seldomly has any headache.  On 07/24/2021, she was diagnosed with COVID, for which she had 2 days headache, that was only the headache she was able to remember.  However, her mom and grandma died of brain bleed, possible aneurysm but not sure.  Her sister died of stomach bleeding.  Pt husband and pt, for the last 2 years, pt gradually has gait difficulty, has hold onto things or walk with cane. No cognitive impairment noted. She had right ear sudden hearing loss, follow with [facility] Erling [facility] in the past, no etiology found, was put on aspirin 325.  She takes HCTZ, lisinopril and Coreg for BP control at home, and compliant with medication.  LSN: 12 PM 08/17/2021 tPA Given: No: ICH  Past Medical History:   Diagnosis Date   Age-related nuclear cataract of right eye 04/15/2020   Arthritis    Cataract    Colon polyps    Diabetes mellitus ([place])    Gallstones    GERD (gastroesophageal reflux disease)    Hearing loss    right ear   Heart murmur    Hyperlipidemia    Hypertension    Hypothyroidism    Tachycardia    Vitreomacular adhesion of right eye 04/15/2020    Past Surgical History:  Procedure Laterality Date   ABDOMINAL HYSTERECTOMY     x 2   APPENDECTOMY     CHOLECYSTECTOMY     with appendectomy   COLONOSCOPY  12/16/2020   TONSILLECTOMY      Family History  Problem Relation Age of Onset   Hypertension Mother        also cerebral hemorrhage   Cerebral aneurysm Mother        also cerebral hemorrhage   Hypertension Father    Diabetes Father        also Alzhemiers   Alzheimer's disease Father    Breast cancer Paternal Grandmother    CAD Maternal Grandfather        Hardening of the arteries   Cerebral aneurysm Maternal Grandfather    Hypertension Sister    Anxiety disorder Sister    Hypertension Son    Colon cancer Neg Hx    Esophageal cancer Neg Hx    Stomach cancer Neg Hx  Rectal cancer Neg Hx    Colon polyps Neg Hx     Social History:  reports that she has never smoked. She has never used smokeless tobacco. She reports that she does not drink alcohol and does not use drugs.  Allergies:  Allergies  Allergen Reactions   Demerol [Meperidine] Other (See Comments)    Unspecified    Macrobid [Nitrofurantoin Macrocrystal] Other (See Comments)    Unspecified    Macrobid [Nitrofurantoin]     Other reaction(s): hearing loss in R ear   Meclizine Other (See Comments)    Unspecified    Sulfa Antibiotics Other (See Comments)    Unspecified     No current facility-administered medications on file prior to encounter.   Current Outpatient Medications on File Prior to Encounter  Medication Sig Dispense Refill   aspirin 325 MG tablet Take 325 mg by mouth daily.      benzonatate (TESSALON) 100 MG capsule Take 100 mg by mouth 3 (three) times daily as needed for cough.     calcium carbonate (TUMS EX) 750 MG chewable tablet Chew 1 tablet by mouth as needed for heartburn.     carvedilol (COREG) 6.25 MG tablet TAKE ONE AND ONE-HALF IN THE AM AND ONE TABLET IN THE PM 270 tablet 1   cholecalciferol (VITAMIN D) 25 MCG (1000 UNIT) tablet 1,000 Units daily.     Coenzyme Q10 (CO Q 10) 100 MG CAPS Take 300 mg by mouth daily.     dicyclomine (BENTYL) 10 MG capsule Take 1 capsule (10 mg total) by mouth 4 (four) times daily -  before meals and at bedtime. 120 capsule 0   empagliflozin (JARDIANCE) 10 MG TABS tablet Take 5 mg by mouth daily.     ezetimibe (ZETIA) 10 MG tablet TAKE 1 TABLET DAILY 90 tablet 3   famotidine (PEPCID) 20 MG tablet Take 20 mg by mouth daily as needed for heartburn or indigestion.     glimepiride (AMARYL) 2 MG tablet Take 2 mg by mouth daily before breakfast.     hydrochlorothiazide (HYDRODIURIL) 25 MG tablet TAKE 1 TABLET DAILY 90 tablet 3   levothyroxine (SYNTHROID, LEVOTHROID) 137 MCG tablet Take 137 mcg by mouth daily before breakfast. One daily, 1 and 1/2 on Sunday     linaGLIPtin-metFORMIN HCl ER 2.01-999 MG TB24 Take 2 tablets by mouth every morning.      lisinopril (PRINIVIL,ZESTRIL) 40 MG tablet Take 40 mg by mouth daily.     metroNIDAZOLE (METROCREAM) 0.75 % cream Apply 1 application topically daily as needed (rosacea).     pantoprazole (PROTONIX) 40 MG tablet Take 1 tablet (40 mg total) by mouth 2 (two) times daily. 180 tablet 3    Review of Systems: A full ROS was attempted today and was able to be performed.  Systems assessed include - Constitutional, Eyes, HENT, Respiratory, Cardiovascular, Gastrointestinal, Genitourinary, Integument/breast, Hematologic/lymphatic, Musculoskeletal, Neurological, Behavioral/Psych, Endocrine, Allergic/Immunologic - with pertinent responses as per HPI.  Physical Examination: Temp:  [97.9 F (36.6  C)-98.6 F (37 C)] 97.9 F (36.6 C) (12/06 0100) Pulse Rate:  [63-76] 66 (12/06 0100) Resp:  [14-19] 15 (12/06 0100) BP: (129-176)/(56-67) 152/67 (12/06 0100) SpO2:  [97 %-100 %] 98 % (12/06 0100) Weight:  [93 kg] 93 kg (12/06 0056)  General - well nourished, well developed, in no apparent distress.    Ophthalmologic - fundi not visualized due to noncooperation.    Cardiovascular - regular rhythm and rate  Mental Status -  Level of arousal and  orientation to time, place, and person were intact. Language including expression, naming, repetition, comprehension, reading, and writing was assessed and found intact. Attention span and concentration were normal. Fund of Knowledge was assessed and was intact.  Cranial Nerves II - XII - II - Vision intact OU. III, IV, VI - Extraocular movements intact. V - Facial sensation intact bilaterally. VII - Facial movement intact bilaterally. VIII - Hearing & vestibular intact bilaterally. X - Palate elevates symmetrically. XI - Chin turning & shoulder shrug intact bilaterally. XII - Tongue protrusion intact.  Motor Strength - The patient's strength was normal in all extremities and pronator drift was absent.   Motor Tone & Bulk - Muscle tone was assessed at the neck and appendages and was normal.  Bulk was normal and fasciculations were absent.   Reflexes - The patient's reflexes were normal in all extremities and she had no pathological reflexes.  Sensory - Light touch, temperature/pinprick were assessed and were normal.    Coordination - The patient had normal movements in the hands and feet with no ataxia or dysmetria.  Tremor was absent.  Gait and Station - deferred  Data Reviewed: DG Chest 2 View  Result Date: 08/17/2021 CLINICAL DATA:  Chest pain and headache x1 day. EXAM: CHEST - 2 VIEW COMPARISON:  June 01, 2009 FINDINGS: The heart size and mediastinal contours are within normal limits. No focal consolidation. No pleural  effusion. No pneumothorax. Thoracic spondylosis. IMPRESSION: No active cardiopulmonary disease. Electronically Signed   By: Dahlia Bailiff M.D.   On: 08/17/2021 16:56   CT Head Wo Contrast  Result Date: 08/17/2021 CLINICAL DATA:  Headache. EXAM: CT HEAD WITHOUT CONTRAST TECHNIQUE: Contiguous axial images were obtained from the base of the skull through the vertex without intravenous contrast. COMPARISON:  None. FINDINGS: Brain: The ventricles and sulci appropriate size for patient's age. The gray-white matter discrimination is preserved. There is a tiny high attenuating focus in the right frontal lobe (sagittal 20/6 and coronal 27/5) suspicious for a small subarachnoid or intraparenchymal hemorrhage. Other etiologies including a thrombosed cortical vein is not excluded. Further evaluation with MRI is recommended. No mass effect or midline shift. Vascular: No hyperdense vessel or unexpected calcification. Skull: No acute calvarial pathology. Sinuses/Orbits: No acute finding. Other: None IMPRESSION: Probable small focus of subarachnoid or intraparenchymal hemorrhage in the right frontal lobe. Other etiologies including a thrombosed vessel is not excluded. Further evaluation with MRI is recommended. These results were called by telephone at the time of interpretation on 08/17/2021 at 10:30 pm to provider [facility] , who verbally acknowledged these results. Electronically Signed   By: Anner Crete M.D.   On: 08/17/2021 22:38   MR BRAIN WO CONTRAST  Result Date: 08/18/2021 CLINICAL DATA:  Headache impossible intracranial hemorrhage EXAM: MRI HEAD WITHOUT CONTRAST TECHNIQUE: Multiplanar, multiecho pulse sequences of the brain and surrounding structures were obtained without intravenous contrast. COMPARISON:  None. FINDINGS: Brain: Punctate focus of acute hemorrhage in the right frontal lobe is better demonstrated on the earlier head CT. There are innumerable chronic microhemorrhages scattered throughout the  brain in a mixed central and peripheral pattern, most likely indicating amyloid angiopathy. There is multifocal hyperintense T2-weighted signal within the white matter. Parenchymal volume and CSF spaces are normal. The midline structures are normal. Vascular: Major flow voids are preserved. Skull and upper cervical spine: Normal calvarium and skull base. Visualized upper cervical spine and soft tissues are normal. Sinuses/Orbits:No paranasal sinus fluid levels or advanced mucosal thickening. No mastoid or  middle ear effusion. Normal orbits. IMPRESSION: 1. Innumerable chronic microhemorrhages in a mixed central and peripheral pattern, consistent with amyloid angiopathy. 2. Punctate focus of acute hemorrhage in the right frontal lobe, better demonstrated on the earlier head CT. Peripheral intraparenchymal hemorrhage is in keeping with amyloid angiopathy. Electronically Signed   By: Ulyses Jarred M.D.   On: 08/18/2021 00:52    Assessment: 75 y.o. female with PMH of HTN, DM, right ear hearing loss presented to ED for HA and CP.  Currently symptoms all resolved.  EKG unremarkable and troponin level negative.  CT head was done showed right frontal tiny ICH.  MRI head then completed showed numerous microhemorrhages, consistent with CAA. She is on aspirin 325 at home.  She takes HCTZ, lisinopril and Coreg for BP control at home, and compliant with medication.  No history of migraine, her mom and grandma died of brain bleed, ? aneurysm but not sure.  Her sister died of stomach bleeding  Etiology for patient right left frontal bleeding likely CAA which showed on MRI.  DDx also including cavernomatosis given family history.  Needed further work-up.  Plan: Recommend internal medicine admission and continue further work up  Frequent neuro checks Telemetry monitoring CT repeat later today along with CTA head and neck to rule out aneurysm Echocardiogram  ESR, CRP (given jaw pain), fasting lipid panel and HgbA1C BP  goal < 140, need BP meds adjustment for better BP control. Resume home meds and close BP monitoring Avoid antiplatelet or anticoagulation. Pt agrees to stop ASA 325 at this time Discussed with Dr. Dina Rich ED physician We will follow   Thank you for this consultation and allowing [place] to participate in the care of this patient.  Rosalin Hawking, MD PhD Stroke Neurology 08/18/2021 2:31 AM

## 2021-08-18 NOTE — Progress Notes (Incomplete)
PROGRESS NOTE    Pamela [name]  AOZ:[id number] DOB: 05/11/1946 DOA: 08/17/2021 PCP: Reynold Bowen, MD   Brief Narrative:  ***  Pamela [name] is a 75 y.o. female with medical history significant for type 2 diabetes mellitus, GERD, hypertension, hyperlipidemia, chronic anemia with baseline hemoglobin 10-12, who is admitted to [facility] on 08/17/2021 with small acute intra parenchymal hemorrhage of the right frontal lobe after presenting from home to [facility] ED complaining of chest pain  Assessment & Plan:   *** Atypical CP Trop neg/ekg NSR   Acute intraparenchymal hemorrhage of the right frontal lobe, POA Secondary to cerebral amyloid angiopathy*** Neuro HTN control  Chronicc anemia  HTN  NIDDM2, uncontrolled Lab Results  Component Value Date   HGBA1C 6.7 (H) 08/18/2021   GERD  HLD  Hypothyroidism     ***   DVT prophylaxis: *** Code Status: *** Family Communication: ***  Status is: ***  Dispo: The patient is from: ***              Anticipated d/c is to: ***              Anticipated d/c date is: ***              Patient currently *** medically stable for discharge  Consultants:  ***  Procedures:  ***  Antimicrobials:  ***   Subjective: ***  Objective: Vitals:   08/18/21 0630 08/18/21 0700 08/18/21 0730 08/18/21 0745  BP: (!) 114/49 (!) 116/54 (!) 133/50 (!) 144/65  Pulse: 63 62 70 70  Resp: 16 15 16 15   Temp:      TempSrc:      SpO2: 96% 96% 98% 97%  Weight:      Height:       No intake or output data in the 24 hours ending 08/18/21 0812 Filed Weights   08/18/21 0056  Weight: 93 kg    Examination:  General exam: Appears calm and comfortable  Respiratory system: Clear to auscultation. Respiratory effort normal. Cardiovascular system: S1 & S2 heard, RRR. No JVD, murmurs, rubs, gallops or clicks. No pedal edema. Gastrointestinal system: Abdomen is nondistended, soft and nontender. No organomegaly or masses felt. Normal bowel  sounds heard. Central nervous system: Alert and oriented. No focal neurological deficits. Extremities: Symmetric 5 x 5 power. Skin: No rashes, lesions or ulcers Psychiatry: Judgement and insight appear normal. Mood & affect appropriate.     Data Reviewed: I have personally reviewed following labs and imaging studies  CBC: Recent Labs  Lab 08/17/21 1555 08/18/21 0234  WBC 8.6 9.1  HGB 11.5* 11.0*  HCT 37.2 35.6*  MCV 88.4 87.9  PLT 304 962   Basic Metabolic Panel: Recent Labs  Lab 08/17/21 1555 08/18/21 0234  NA 141 139  K 3.9 3.6  CL 109 106  CO2 24 23  GLUCOSE 148* 94  BUN 13 12  CREATININE 0.82 0.78  CALCIUM 9.2 8.9  MG  --  1.4*   GFR: Estimated Creatinine Clearance: 68.8 mL/min (by C-G formula based on SCr of 0.78 mg/dL). Liver Function Tests: Recent Labs  Lab 08/18/21 0234  AST 16  ALT 12  ALKPHOS 66  BILITOT 0.7  PROT 6.0*  ALBUMIN 3.2*   No results for input(s): LIPASE, AMYLASE in the last 168 hours. No results for input(s): AMMONIA in the last 168 hours. Coagulation Profile: Recent Labs  Lab 08/18/21 0234  INR 1.0   Cardiac Enzymes: No results for input(s): CKTOTAL, CKMB, CKMBINDEX,  TROPONINI in the last 168 hours. BNP (last 3 results) No results for input(s): PROBNP in the last 8760 hours. HbA1C: Recent Labs    08/18/21 0234  HGBA1C 6.7*   CBG: No results for input(s): GLUCAP in the last 168 hours. Lipid Profile: Recent Labs    08/18/21 0234  CHOL 164  HDL 59  LDLCALC 89  TRIG 79  CHOLHDL 2.8   Thyroid Function Tests: No results for input(s): TSH, T4TOTAL, FREET4, T3FREE, THYROIDAB in the last 72 hours. Anemia Panel: No results for input(s): VITAMINB12, FOLATE, FERRITIN, TIBC, IRON, RETICCTPCT in the last 72 hours. Sepsis Labs: No results for input(s): PROCALCITON, LATICACIDVEN in the last 168 hours.  Recent Results (from the past 240 hour(s))  Resp Panel by RT-PCR (Flu A&B, Covid) Nasopharyngeal Swab     Status: None    Collection Time: 08/18/21  2:25 AM   Specimen: Nasopharyngeal Swab; Nasopharyngeal(NP) swabs in vial transport medium  Result Value Ref Range Status   SARS Coronavirus 2 by RT PCR NEGATIVE NEGATIVE Final    Comment: (NOTE) SARS-CoV-2 target nucleic acids are NOT DETECTED.  The SARS-CoV-2 RNA is generally detectable in upper respiratory specimens during the acute phase of infection. The lowest concentration of SARS-CoV-2 viral copies this assay can detect is 138 copies/mL. A negative result does not preclude SARS-Cov-2 infection and should not be used as the sole basis for treatment or other patient management decisions. A negative result may occur with  improper specimen collection/handling, submission of specimen other than nasopharyngeal swab, presence of viral mutation(s) within the areas targeted by this assay, and inadequate number of viral copies(<138 copies/mL). A negative result must be combined with clinical observations, patient history, and epidemiological information. The expected result is Negative.  Fact Sheet for Patients:  EntrepreneurPulse.com.au  Fact Sheet for Healthcare Providers:  IncredibleEmployment.be  This test is no t yet approved or cleared by the [place] FDA and  has been authorized for detection and/or diagnosis of SARS-CoV-2 by FDA under an Emergency Use Authorization (EUA). This EUA will remain  in effect (meaning this test can be used) for the duration of the COVID-19 declaration under Section 564(b)(1) of the Act, 21 U.S.C.section 360bbb-3(b)(1), unless the authorization is terminated  or revoked sooner.       Influenza A by PCR NEGATIVE NEGATIVE Final   Influenza B by PCR NEGATIVE NEGATIVE Final    Comment: (NOTE) The Xpert Xpress SARS-CoV-2/FLU/RSV plus assay is intended as an aid in the diagnosis of influenza from Nasopharyngeal swab specimens and should not be used as a sole basis for treatment.  Nasal washings and aspirates are unacceptable for Xpert Xpress SARS-CoV-2/FLU/RSV testing.  Fact Sheet for Patients: EntrepreneurPulse.com.au  Fact Sheet for Healthcare Providers: IncredibleEmployment.be  This test is not yet approved or cleared by the [place] FDA and has been authorized for detection and/or diagnosis of SARS-CoV-2 by FDA under an Emergency Use Authorization (EUA). This EUA will remain in effect (meaning this test can be used) for the duration of the COVID-19 declaration under Section 564(b)(1) of the Act, 21 U.S.C. section 360bbb-3(b)(1), unless the authorization is terminated or revoked.  Performed at [facility], [place] [address] [place], [place] [address]          Radiology Studies: DG Chest 2 View  Result Date: 08/17/2021 CLINICAL DATA:  Chest pain and headache x1 day. EXAM: CHEST - 2 VIEW COMPARISON:  June 01, 2009 FINDINGS: The heart size and mediastinal contours are within normal limits.  No focal consolidation. No pleural effusion. No pneumothorax. Thoracic spondylosis. IMPRESSION: No active cardiopulmonary disease. Electronically Signed   By: Dahlia Bailiff M.D.   On: 08/17/2021 16:56   CT Head Wo Contrast  Result Date: 08/17/2021 CLINICAL DATA:  Headache. EXAM: CT HEAD WITHOUT CONTRAST TECHNIQUE: Contiguous axial images were obtained from the base of the skull through the vertex without intravenous contrast. COMPARISON:  None. FINDINGS: Brain: The ventricles and sulci appropriate size for patient's age. The gray-white matter discrimination is preserved. There is a tiny high attenuating focus in the right frontal lobe (sagittal 20/6 and coronal 27/5) suspicious for a small subarachnoid or intraparenchymal hemorrhage. Other etiologies including a thrombosed cortical vein is not excluded. Further evaluation with MRI is recommended. No mass effect or midline shift. Vascular: No hyperdense vessel or unexpected  calcification. Skull: No acute calvarial pathology. Sinuses/Orbits: No acute finding. Other: None IMPRESSION: Probable small focus of subarachnoid or intraparenchymal hemorrhage in the right frontal lobe. Other etiologies including a thrombosed vessel is not excluded. Further evaluation with MRI is recommended. These results were called by telephone at the time of interpretation on 08/17/2021 at 10:30 pm to provider [facility] , who verbally acknowledged these results. Electronically Signed   By: Anner Crete M.D.   On: 08/17/2021 22:38   MR BRAIN WO CONTRAST  Result Date: 08/18/2021 CLINICAL DATA:  Headache impossible intracranial hemorrhage EXAM: MRI HEAD WITHOUT CONTRAST TECHNIQUE: Multiplanar, multiecho pulse sequences of the brain and surrounding structures were obtained without intravenous contrast. COMPARISON:  None. FINDINGS: Brain: Punctate focus of acute hemorrhage in the right frontal lobe is better demonstrated on the earlier head CT. There are innumerable chronic microhemorrhages scattered throughout the brain in a mixed central and peripheral pattern, most likely indicating amyloid angiopathy. There is multifocal hyperintense T2-weighted signal within the white matter. Parenchymal volume and CSF spaces are normal. The midline structures are normal. Vascular: Major flow voids are preserved. Skull and upper cervical spine: Normal calvarium and skull base. Visualized upper cervical spine and soft tissues are normal. Sinuses/Orbits:No paranasal sinus fluid levels or advanced mucosal thickening. No mastoid or middle ear effusion. Normal orbits. IMPRESSION: 1. Innumerable chronic microhemorrhages in a mixed central and peripheral pattern, consistent with amyloid angiopathy. 2. Punctate focus of acute hemorrhage in the right frontal lobe, better demonstrated on the earlier head CT. Peripheral intraparenchymal hemorrhage is in keeping with amyloid angiopathy. Electronically Signed   By: Ulyses Jarred  M.D.   On: 08/18/2021 00:52        Scheduled Meds:  carvedilol  12.5 mg Oral BID WC   ezetimibe  10 mg Oral Daily   hydrochlorothiazide  25 mg Oral Daily   insulin aspart  0-6 Units Subcutaneous TID WC   levothyroxine  137 mcg Oral QAC breakfast   lisinopril  40 mg Oral Daily   pantoprazole  40 mg Oral BID   Continuous Infusions:   LOS: 0 days    Time spent: ***    Little Ishikawa, DO [facility]  If 7PM-7AM, please contact night-coverage [email or web address]  08/18/2021, 8:12 AM

## 2021-08-24 ENCOUNTER — Ambulatory Visit (INDEPENDENT_AMBULATORY_CARE_PROVIDER_SITE_OTHER): Payer: Medicare Other | Admitting: Student

## 2021-08-24 ENCOUNTER — Encounter: Payer: Self-pay | Admitting: Student

## 2021-08-24 ENCOUNTER — Other Ambulatory Visit: Payer: Self-pay

## 2021-08-24 VITALS — BP 128/86 | HR 59 | Ht 65.0 in | Wt 205.0 lb

## 2021-08-24 DIAGNOSIS — I10 Essential (primary) hypertension: Secondary | ICD-10-CM | POA: Diagnosis not present

## 2021-08-24 DIAGNOSIS — E118 Type 2 diabetes mellitus with unspecified complications: Secondary | ICD-10-CM | POA: Diagnosis not present

## 2021-08-24 DIAGNOSIS — R0789 Other chest pain: Secondary | ICD-10-CM

## 2021-08-24 DIAGNOSIS — E785 Hyperlipidemia, unspecified: Secondary | ICD-10-CM

## 2021-08-24 DIAGNOSIS — I619 Nontraumatic intracerebral hemorrhage, unspecified: Secondary | ICD-10-CM

## 2021-08-24 NOTE — Progress Notes (Signed)
Cardiology Office Note:    Date:  08/24/2021   ID:  Pamela [name], DOB 1946-08-02, MRN [id number]  PCP:  Reynold Bowen, MD  Cardiologist:  Shelva Majestic, MD  Electrophysiologist:  None   Referring MD: Reynold Bowen, MD   Chief Complaint: hospital follow-up of chest pain  History of Present Illness:    Pamela [name] is a 75 y.o. female with a history of chest pain with normal coronaries on cardiac cath in 2010 and negative Myoview in 2014, recent acute intraparenchymal hemorrhage, hypertension, hyperlipidemia, type 2 diabetes, hypothyroidism, and GERD who is followed by Dr. Claiborne Billings presents today for hospital follow-up of chest pain.  Patient has a history of chest pain. Remote cardiac catheterization in 05/2009 showed normal coronaries. Myoview in 2014 was low risk with mild breast attenuation but no evidence of scar or ischemia. Last Echo in 2017 showed LVEF of 60-65% with normal wall motion, grade 1 diastolic dysfunction, and trace to mild MR.   Patient was last seen by Dr. Claiborne Billings in 07/2020 at which time she was doing well from a cardiac standpoint. She was restarted on Zetia at that time.  Since last visit, she was recently admitted from 08/17/2021 to 08/18/2021 with a small acute intraparenchymal hemorrhage of the right frontal lobe. She actually presented with sharp not radiating substernal chest pain that occurred at rest and resolved on its own. Cardiac work-up in the ED was unremarkable with negative high-sensitivity troponin x2 and non-acute EKG. On admission, she reported that chest pain was associated with frontal headache and numbness into right extremity. Head CT was ordered and showed probable small focus of subarachnoid or intraparenchymal hemorrhage. Brain MRI showed innumerable chronic microhemorrhages in a mixed central and peripheral pattern consistent with amyloid angiopathy as was as punctate focus of acute hemorrhage in the right frontal lobe. Neurology was consulted and  there was strong suspicion for amyloid angiopathy though hypertensive microangiopathic disease also possible. Aspirin was held for 1 week but then she was given the OK to restart this at 81mg  daily. Strict BP control with goal <130/90 was recommended.  Patient presents today for follow-up. Here with son. Discussed events that led to recent hospitalization. Patient states she was in her usual state of health on 08/17/2021 when all of a sudden she developed sudden onset of a headache, chest pain, palpitations, right arm numbness, and dizziness. Patient sat down and symptoms improved some but lingered for about 2 hours which is when they called 911. Pain ultimately resolved on its own. Patient denies any recent chest pain prior to this and no recurrent chest pain since. No shortness of breath, orthopnea, PND, lower extremity edema. No other palpitations or dizziness. No syncope. She has occasional very mild right sided headache but states it does not last long. Overall doing well since discharge.  Past Medical History:  Diagnosis Date   Age-related nuclear cataract of right eye 04/15/2020   Arthritis    Cataract    Colon polyps    Diabetes mellitus ([place])    Gallstones    GERD (gastroesophageal reflux disease)    Hearing loss    right ear   Heart murmur    Hyperlipidemia    Hypertension    Hypothyroidism    Tachycardia    Vitreomacular adhesion of right eye 04/15/2020    Past Surgical History:  Procedure Laterality Date   ABDOMINAL HYSTERECTOMY     x 2   APPENDECTOMY     CHOLECYSTECTOMY  with appendectomy   COLONOSCOPY  12/16/2020   TONSILLECTOMY      Current Medications: Current Meds  Medication Sig   amLODipine (NORVASC) 5 MG tablet Take 1 tablet (5 mg total) by mouth daily.   calcium carbonate (TUMS EX) 750 MG chewable tablet Chew 1 tablet by mouth as needed for heartburn.   carvedilol (COREG) 12.5 MG tablet Take 1 tablet (12.5 mg total) by mouth 2 (two) times daily with a meal.    cholecalciferol (VITAMIN D) 25 MCG (1000 UNIT) tablet 1,000 Units daily.   dicyclomine (BENTYL) 10 MG capsule Take 1 capsule (10 mg total) by mouth 4 (four) times daily -  before meals and at bedtime.   empagliflozin (JARDIANCE) 10 MG TABS tablet Take 5 mg by mouth daily.   ezetimibe (ZETIA) 10 MG tablet TAKE 1 TABLET DAILY   glimepiride (AMARYL) 2 MG tablet Take 2 mg by mouth daily before breakfast.   hydrochlorothiazide (HYDRODIURIL) 25 MG tablet TAKE 1 TABLET DAILY   levothyroxine (SYNTHROID, LEVOTHROID) 137 MCG tablet Take 137 mcg by mouth daily before breakfast. One daily, 1 and 1/2 on Sunday   linaGLIPtin-metFORMIN HCl ER 2.01-999 MG TB24 Take 2 tablets by mouth every morning.    lisinopril (PRINIVIL,ZESTRIL) 40 MG tablet Take 40 mg by mouth daily.   pantoprazole (PROTONIX) 40 MG tablet Take 1 tablet (40 mg total) by mouth 2 (two) times daily.     Allergies:   Demerol [meperidine], Macrobid [nitrofurantoin macrocrystal], Macrobid [nitrofurantoin], Meclizine, and Sulfa antibiotics   Social History   Socioeconomic History   Marital status: Married    Spouse name: Not on file   Number of children: 1   Years of education: Not on file   Highest education level: Not on file  Occupational History   Occupation: retired  Tobacco Use   Smoking status: Never   Smokeless tobacco: Never  Vaping Use   Vaping Use: Never used  Substance and Sexual Activity   Alcohol use: No   Drug use: No   Sexual activity: Not Currently    Partners: Male    Comment: intercourse- 54, partners-1, Married- 61 yrs  Other Topics Concern   Not on file  Social History Narrative   Not on file   Social Determinants of Health   Financial Resource Strain: Not on file  Food Insecurity: Not on file  Transportation Needs: Not on file  Physical Activity: Not on file  Stress: Not on file  Social Connections: Not on file     Family History: The patient's family history includes Alzheimer's disease in her  father; Anxiety disorder in her sister; Breast cancer in her paternal grandmother; CAD in her maternal grandfather; Cerebral aneurysm in her maternal grandfather and mother; Diabetes in her father; Hypertension in her father, mother, sister, and son. There is no history of Colon cancer, Esophageal cancer, Stomach cancer, Rectal cancer, or Colon polyps.  ROS:   Please see the history of present illness.     EKGs/Labs/Other Studies Reviewed:    The following studies were reviewed today:  Myoview 12/07/2012: Impression: Exercise Capacity:  Lexiscan with no exercise. BP Response:  Normal blood pressure response. Clinical Symptoms:  There is dyspnea. ECG Impression:  No significant ECG changes with Lexiscan. Comparison with Prior Nuclear Study: No previous nuclear study performed LV Wall Motion:  NL LV Function; NL Wall Motion   Overall Impression:  Normal stress nuclear study with mild breast attenuation; Low risk stress nuclear study. _______________  Echocardiogram 03/30/2016: Study  Conclusions: - Left ventricle: The cavity size was normal. Wall thickness was    increased in a pattern of mild LVH. Systolic function was normal.    The estimated ejection fraction was in the range of 60% to 65%.    Wall motion was normal; there were no regional wall motion    abnormalities. Doppler parameters are consistent with abnormal    left ventricular relaxation (grade 1 diastolic dysfunction). The    E/e&' ratio is between 8-15, suggesting indeterminate LV filling    pressure.  - Mitral valve: Mildly thickened leaflets . There was trace to mild    regurgitation.  - Left atrium: The atrium was normal in size.  - Right atrium: The atrium was at the upper limits of normal in    size.  - Tricuspid valve: There was trivial regurgitation. Jet    insufficient to measure TR velocity.  - Inferior vena cava: The vessel was normal in size. The    respirophasic diameter changes were in the normal range (>=  50%),    consistent with normal central venous pressure.   Impressions: - LVEF 60-65%, mild LVH, normal wall motion, diastolic dysfunction,    indeterminate LV filling pressure, trace to mild MR, normal LA    size, upper normal RA size, trivial TR, normal IVC.  EKG:  EKG ordered today. EKG personally reviewed and demonstrates sinus bradycardia, rate 59 bpm, with no acute ST/T changes. Normal axis. Normal PR and QRS intervals. QTc 401 ms.  Recent Labs: 08/18/2021: ALT 12; BUN 12; Creatinine, Ser 0.78; Hemoglobin 11.0; Magnesium 1.4; Platelets 278; Potassium 3.6; Sodium 139  Recent Lipid Panel    Component Value Date/Time   CHOL 164 08/18/2021 0234   CHOL 177 03/01/2017 1024   CHOL 140 04/17/2013 0856   TRIG 79 08/18/2021 0234   TRIG 159 (H) 04/17/2013 0856   HDL 59 08/18/2021 0234   HDL 62 03/01/2017 1024   HDL 46 04/17/2013 0856   CHOLHDL 2.8 08/18/2021 0234   VLDL 16 08/18/2021 0234   LDLCALC 89 08/18/2021 0234   LDLCALC 89 03/01/2017 1024   LDLCALC 62 04/17/2013 0856    Physical Exam:    Vital Signs: BP 128/86 (BP Location: Left Arm, Patient Position: Sitting, Cuff Size: Large)   Pulse (!) 59   Ht 5\' 5"  (1.651 m)   Wt 205 lb (93 kg)   SpO2 96%   BMI 34.11 kg/m     Wt Readings from Last 3 Encounters:  08/24/21 205 lb (93 kg)  08/18/21 205 lb (93 kg)  06/22/21 212 lb 8 oz (96.4 kg)     General: 75 y.o. Caucasian female in no acute distress. HEENT: Normocephalic and atraumatic. Sclera clear. EOMs intact. Neck: Supple. No carotid bruits. No JVD. Heart: RRR. Distinct S1 and S2. No murmurs, gallops, or rubs. Radial pulses 2+ and equal bilaterally. Lungs: No increased work of breathing. Clear to ausculation bilaterally. No wheezes, rhonchi, or rales.  Abdomen: Soft, non-distended, and non-tender to palpation.  MSK: Ambulates with a cane. Extremities: No lower extremity edema.    Skin: Warm and dry. Neuro: Alert and oriented x3. No focal deficits. Psych: Normal  affect. Responds appropriately.  Assessment:    1. Atypical chest pain   2. Primary hypertension   3. Hyperlipidemia, unspecified hyperlipidemia type   4. Type 2 diabetes mellitus with complication, without long-term current use of insulin ([place])   5. Intraparenchymal hemorrhage of brain [facility])     Plan:  Atypical Chest Pain History of chest pain. Remote cardiac cath in 2010 showed normal coronaries. Myoview in 2014 showed no evidence of ischemia. Recently admitted with multiple symptoms including chest pain and found to have acute small intraparenchymal hemorrhage of the right frontal lobe. EKG showed no acute changes and high-sensitivity troponin negative x2. - This was an isolated event. She denies any recent chest pain prior to this event and no recurrent pain since.  - No acute ischemic changes on EKG today. - Given no recurrence, will hold off on any additional ischemic evaluation at this time. If she had recurrent chest pain, would recommend a coronary CTA. However, I will go ahead and order and Echo given recent hemorrhagic stroke pattern concerning for amyloid angiopathy.  Hypertension BP borderline at 128/86 today. Patient brought in home BP log and normally at goal of <130/80.  - Continue current medications: Amlodipine 5mg  daily, Coreg 12.5mg  twice daily, HCTZ 25mg  daily, and Lisinopril 40mg  daily. - Advised patient to continue to keep log of BP at home and bring to next office visit.   Hyperlipidemia Recent lipid panel on 08/18/2021:  Total Cholesterol 164, Triglycerides 79, HDL 59, LDL 89. - Intolerant to statins in the past. Continue Zetia 10mg  daily. Also on Coenzyme Q10 300mg  daily.  Type 2 Diabetes Mellitus Hemoglobin A1c 6.7 during admission.  - Blood sugar has been well controlled at home. - On Glimepiride, Jardiance, Linagliptin-Metformin.  - Management per PCP.  Intraparenchymal Hemorrhage Recently admitted with small intraparenchymal hemorrhage. - Per last  note from Neurology in the hospital, plan was to hold Aspirin for 1 week and then OK to restart at 81mg  daily. Therefore, he can restart Aspirin on 08/26/2021.  - Patient stated she need a referral to Neurology for follow-up. I am not sure if she actually needs a referral since they saw her in the hospital but will go ahead and place one just in case.  Disposition: Patient already has a follow-up visit with me scheduled for 10/12/2020.    Medication Adjustments/Labs and Tests Ordered: Current medicines are reviewed at length with the patient today.  Concerns regarding medicines are outlined above.  Orders Placed This Encounter  Procedures   Ambulatory referral to Neurology   EKG 12-Lead   ECHOCARDIOGRAM COMPLETE   No orders of the defined types were placed in this encounter.   Patient Instructions  Medication Instructions:  Your physician recommends that you continue on your current medications as directed. Please refer to the Current Medication list given to you today.  *If you need a refill on your cardiac medications before your next appointment, please call your pharmacy*  Lab Work: NONE ordered at this time of appointment   If you have labs (blood work) drawn today and your tests are completely normal, you will receive your results only by: [place] (if you have MyChart) OR A paper copy in the mail If you have any lab test that is abnormal or we need to change your treatment, we will call you to review the results.  Testing/Procedures: Your physician has requested that you have an echocardiogram. Echocardiography is a painless test that uses sound waves to create images of your heart. It provides your doctor with information about the size and shape of your heart and how well your heart's chambers and valves are working. This procedure takes approximately one hour. There are no restrictions for this procedure.  Please schedule for 4-5 weeks at [facility] office    Follow-Up: At [facility], you and your health needs are our priority.  As part of our continuing mission to provide you with exceptional heart care, we have created designated Provider Care Teams.  These Care Teams include your primary Cardiologist (physician) and Advanced Practice Providers (APPs -  Physician Assistants and Nurse Practitioners) who all work together to provide you with the care you need, when you need it.  We recommend signing up for the patient portal called "MyChart".  Sign up information is provided on this After Visit Summary.  MyChart is used to connect with patients for Virtual Visits (Telemedicine).  Patients are able to view lab/test results, encounter notes, upcoming appointments, etc.  Non-urgent messages can be sent to your provider as well.   To learn more about what you can do with MyChart, go to [email or web address].    Your next appointment:   As schedule    The format for your next appointment:   In Person  Provider:   Sande Rives, PA-C       Other Instructions You have been referred to [facility] [phone]    Signed, Eppie Gibson  08/24/2021 3:48 PM    [place]

## 2021-08-24 NOTE — Patient Instructions (Addendum)
Medication Instructions:  Your physician recommends that you continue on your current medications as directed. Please refer to the Current Medication list given to you today.  *If you need a refill on your cardiac medications before your next appointment, please call your pharmacy*  Lab Work: NONE ordered at this time of appointment   If you have labs (blood work) drawn today and your tests are completely normal, you will receive your results only by: [place] (if you have MyChart) OR A paper copy in the mail If you have any lab test that is abnormal or we need to change your treatment, we will call you to review the results.  Testing/Procedures: Your physician has requested that you have an echocardiogram. Echocardiography is a painless test that uses sound waves to create images of your heart. It provides your doctor with information about the size and shape of your heart and how well your heart's chambers and valves are working. This procedure takes approximately one hour. There are no restrictions for this procedure.  Please schedule for 4-5 weeks at [facility] office   Follow-Up: At [facility], you and your health needs are our priority.  As part of our continuing mission to provide you with exceptional heart care, we have created designated Provider Care Teams.  These Care Teams include your primary Cardiologist (physician) and Advanced Practice Providers (APPs -  Physician Assistants and Nurse Practitioners) who all work together to provide you with the care you need, when you need it.  We recommend signing up for the patient portal called "MyChart".  Sign up information is provided on this After Visit Summary.  MyChart is used to connect with patients for Virtual Visits (Telemedicine).  Patients are able to view lab/test results, encounter notes, upcoming appointments, etc.  Non-urgent messages can be sent to your provider as well.   To learn more about what you can do  with MyChart, go to [email or web address].    Your next appointment:   As schedule    The format for your next appointment:   In Person  Provider:   Sande Rives, PA-C       Other Instructions You have been referred to [facility] [phone]

## 2021-08-26 DIAGNOSIS — E785 Hyperlipidemia, unspecified: Secondary | ICD-10-CM | POA: Diagnosis not present

## 2021-08-26 DIAGNOSIS — D649 Anemia, unspecified: Secondary | ICD-10-CM | POA: Diagnosis not present

## 2021-08-26 DIAGNOSIS — E1142 Type 2 diabetes mellitus with diabetic polyneuropathy: Secondary | ICD-10-CM | POA: Diagnosis not present

## 2021-08-26 DIAGNOSIS — E854 Organ-limited amyloidosis: Secondary | ICD-10-CM | POA: Diagnosis not present

## 2021-08-26 DIAGNOSIS — I129 Hypertensive chronic kidney disease with stage 1 through stage 4 chronic kidney disease, or unspecified chronic kidney disease: Secondary | ICD-10-CM | POA: Diagnosis not present

## 2021-08-26 DIAGNOSIS — N1831 Chronic kidney disease, stage 3a: Secondary | ICD-10-CM | POA: Diagnosis not present

## 2021-08-26 DIAGNOSIS — I680 Cerebral amyloid angiopathy: Secondary | ICD-10-CM | POA: Diagnosis not present

## 2021-08-26 DIAGNOSIS — I619 Nontraumatic intracerebral hemorrhage, unspecified: Secondary | ICD-10-CM | POA: Diagnosis not present

## 2021-08-26 DIAGNOSIS — R0789 Other chest pain: Secondary | ICD-10-CM | POA: Diagnosis not present

## 2021-08-28 ENCOUNTER — Ambulatory Visit: Payer: Medicare Other | Admitting: General Practice

## 2021-09-09 ENCOUNTER — Encounter: Payer: Self-pay | Admitting: *Deleted

## 2021-09-10 ENCOUNTER — Ambulatory Visit (HOSPITAL_COMMUNITY): Payer: Medicare Other | Attending: Cardiovascular Disease

## 2021-09-10 ENCOUNTER — Encounter (INDEPENDENT_AMBULATORY_CARE_PROVIDER_SITE_OTHER): Payer: Self-pay

## 2021-09-10 ENCOUNTER — Other Ambulatory Visit: Payer: Self-pay

## 2021-09-10 DIAGNOSIS — R0789 Other chest pain: Secondary | ICD-10-CM | POA: Diagnosis not present

## 2021-09-10 LAB — ECHOCARDIOGRAM COMPLETE
Area-P 1/2: 2.6 cm2
S' Lateral: 3 cm

## 2021-09-11 ENCOUNTER — Other Ambulatory Visit: Payer: Self-pay

## 2021-09-11 ENCOUNTER — Encounter: Payer: Self-pay | Admitting: Gastroenterology

## 2021-09-11 ENCOUNTER — Ambulatory Visit (INDEPENDENT_AMBULATORY_CARE_PROVIDER_SITE_OTHER): Payer: Medicare Other | Admitting: Gastroenterology

## 2021-09-11 ENCOUNTER — Other Ambulatory Visit (INDEPENDENT_AMBULATORY_CARE_PROVIDER_SITE_OTHER): Payer: Medicare Other

## 2021-09-11 VITALS — BP 122/52 | HR 60 | Ht 67.0 in | Wt 204.4 lb

## 2021-09-11 DIAGNOSIS — B9681 Helicobacter pylori [H. pylori] as the cause of diseases classified elsewhere: Secondary | ICD-10-CM

## 2021-09-11 DIAGNOSIS — D649 Anemia, unspecified: Secondary | ICD-10-CM

## 2021-09-11 DIAGNOSIS — K2970 Gastritis, unspecified, without bleeding: Secondary | ICD-10-CM

## 2021-09-11 DIAGNOSIS — K921 Melena: Secondary | ICD-10-CM | POA: Diagnosis not present

## 2021-09-11 DIAGNOSIS — R634 Abnormal weight loss: Secondary | ICD-10-CM | POA: Diagnosis not present

## 2021-09-11 LAB — IRON: Iron: 51 ug/dL (ref 42–145)

## 2021-09-11 LAB — FERRITIN: Ferritin: 24.5 ng/mL (ref 10.0–291.0)

## 2021-09-11 LAB — HEMOGLOBIN: Hemoglobin: 11.7 g/dL — ABNORMAL LOW (ref 12.0–15.0)

## 2021-09-11 MED ORDER — FERROUS SULFATE 324 (65 FE) MG PO TBEC: 1.0000 | DELAYED_RELEASE_TABLET | ORAL | Status: AC

## 2021-09-11 MED ORDER — DICYCLOMINE HCL 20 MG PO TABS: 20.0000 mg | ORAL_TABLET | Freq: Three times a day (TID) | ORAL | 2 refills | Status: DC

## 2021-09-11 NOTE — Progress Notes (Signed)
Referring Provider: Reynold Bowen, MD Primary Care Physician:  Reynold Bowen, MD  Chief complaint:  abdominal pain, reflux and diarrhea   IMPRESSION:  Unexplained weight loss    - she attributes weight loss to adaptation of high fiber diet and recent illness    - last EGD and colonoscopy 12/2020    - she is pleased with her weight loss and would ultimately like to lose more ? Overt GI blood loss during her hospitalization    - no overt bleeding by patient history    - repeat CBC and iron studies for reassurance Alternating diarrhea and constipation History of H pylori gastritis    -  follow-up testing documented resolution Reflux with symptoms despite famotidine    - symptoms improving on pantoprazole 40 mg BID    - now using famotidine PRN    - esophageal biopsies negative for EOE History of colon polyps    - 2 tubular adenomas 2015    - 1 tubular adenoma 2022    - surveillance colonoscopy recommended in 7 years Diverticulosis without history of diverticulitis  Altered bowel habits and weight loss could be explained by amyloid. Await results of evaluation for neuroamyloid. We discussed Ctscan given her persistent weight loss. However, she believes this is due to her recent Covid and hospitalization. Will schedule CT whenever she wants to proceed. Low theshold to repeat endoscopy with [place] stain of biopsies if amyloid is documented elsewhere.    PLAN: - Continue high fiber diet, consider addition of Metamucil - Continue pantoprazole 40 mg BID - Continue to use Pepcid 20 mg BID, she has been using it PRN and she may take it more regularly - Increase dicyclomine taken 20 mg QID prn with instructions to take prior to meals - CBC, iron, ferritin - Low threshold to consider antibiotics for SIBO if amyloid documented by neurology - CT scan abd/pelvis with contrast given her ongoing weight loss - Follow-up in 4-5 months, earlier as needed  HPI: Pamela [name] is a 75 y.o.  female who returns in follow-up for reflux and weight loss. The interval history is obtained through the patient and her husband, who contributes to the history.  I've also reviewed her recent records in the electronic health record.   She has type 2 diabetes mellitus, GERD, hypertension, hyperlipidemia, chronic anemia with baseline hemoglobin 10-12, H pylori gastritis with follow-up negative testing, Covid in November 2022 with prolonged symptoms, and was hospitalized earlier this month with small acute intra parenchymal hemorrhage of the right frontal lobe after presenting with chest pain, headache, and jaw pain. She is currently under evaluation for possible amyloid angiopathy. The hospitalusts were concerned for possible GI bleeding was hospitalized, although her hemoglobin was always stable. She was not aware of any blood in the stool or melena during the hospitalization.  She continues to have intermittent LUQ pain, abdominal swelling, and rare diarrhea. She is having postprandial diarrhea that occurs within minutes of eating, but also experiences some constipation.  Maybe somewhat improved with dicyclomine 10 mg QID and high fiber diet. Loose stools occurring intermittently. Reflux is better. She wonders if increasing the dose of the dicyclomine might help.   Labs 08/18/21 Hemoglobin 11, MCV 87.9, RDW 17.5, platelets 278   Endoscopic history: - Colonoscopy 2005: large IC valve but no polyps - Colonoscopy 2015: pancolonic diverticulosis and 2 tubular adenomas - Colonoscopy 12/16/20: diverticulosis, 74mm descending colon tubular adenoma - EGD 12/16/20: irregular Z-line, biopsies confirming reflux and H. pylori  gastritis. No EOE.  No recent abdominal imaging.  Past Medical History:  Diagnosis Date   Age-related nuclear cataract of right eye 04/15/2020   Amyloidosis ([place])    organ limited   Arthritis    Cataract    Colon polyps    Diabetes mellitus ([place])    Diverticulosis    Gallstones     GERD (gastroesophageal reflux disease)    Hearing loss    right ear   Heart murmur    Helicobacter pylori gastritis    Hiatal hernia    Hyperlipidemia    Hypertension    Hypothyroidism    Tachycardia    Tubular adenoma of colon    Vitreomacular adhesion of right eye 04/15/2020    Past Surgical History:  Procedure Laterality Date   ABDOMINAL HYSTERECTOMY     x 2   APPENDECTOMY     CHOLECYSTECTOMY     with appendectomy   COLONOSCOPY  12/16/2020   TONSILLECTOMY      Current Outpatient Medications  Medication Sig Dispense Refill   amLODipine (NORVASC) 5 MG tablet Take 1 tablet (5 mg total) by mouth daily. 30 tablet 1   aspirin 81 MG EC tablet 1 tablet     calcium carbonate (TUMS EX) 750 MG chewable tablet Chew 1 tablet by mouth as needed for heartburn.     carvedilol (COREG) 12.5 MG tablet Take 1 tablet (12.5 mg total) by mouth 2 (two) times daily with a meal. 60 tablet 1   cholecalciferol (VITAMIN D) 25 MCG (1000 UNIT) tablet 1,000 Units daily.     Coenzyme Q10 (CO Q 10) 100 MG CAPS Take 300 mg by mouth daily.     dicyclomine (BENTYL) 10 MG capsule Take 1 capsule (10 mg total) by mouth 4 (four) times daily -  before meals and at bedtime. 120 capsule 0   empagliflozin (JARDIANCE) 10 MG TABS tablet Take 5 mg by mouth daily.     ezetimibe (ZETIA) 10 MG tablet TAKE 1 TABLET DAILY 90 tablet 3   famotidine (PEPCID) 20 MG tablet Take 20 mg by mouth daily as needed for heartburn or indigestion.     glimepiride (AMARYL) 2 MG tablet Take 2 mg by mouth daily before breakfast.     hydrochlorothiazide (HYDRODIURIL) 25 MG tablet TAKE 1 TABLET DAILY 90 tablet 3   levothyroxine (SYNTHROID, LEVOTHROID) 137 MCG tablet Take 137 mcg by mouth daily before breakfast. One daily, 1 and 1/2 on Sunday     linaGLIPtin-metFORMIN HCl ER 2.01-999 MG TB24 Take 2 tablets by mouth every morning.      lisinopril (PRINIVIL,ZESTRIL) 40 MG tablet Take 40 mg by mouth daily.     metroNIDAZOLE (METROCREAM) 0.75 %  cream Apply 1 application topically daily as needed (rosacea).     pantoprazole (PROTONIX) 40 MG tablet Take 1 tablet (40 mg total) by mouth 2 (two) times daily. 180 tablet 3   No current facility-administered medications for this visit.    Allergies as of 09/11/2021 - Review Complete 09/11/2021  Allergen Reaction Noted   Demerol [meperidine] Other (See Comments) 12/07/2012   Macrobid [nitrofurantoin macrocrystal] Other (See Comments) 12/07/2012   Macrobid [nitrofurantoin]  03/10/2021   Meclizine Other (See Comments) 12/07/2012   Sulfa antibiotics Other (See Comments) 12/07/2012    Family History  Problem Relation Age of Onset   Hypertension Mother        also cerebral hemorrhage   Cerebral aneurysm Mother        also cerebral hemorrhage  Hypertension Father    Diabetes Father        also Alzhemiers   Alzheimer's disease Father    Breast cancer Paternal Grandmother    CAD Maternal Grandfather        Hardening of the arteries   Cerebral aneurysm Maternal Grandfather    Hypertension Sister    Anxiety disorder Sister    Hypertension Son    Colon cancer Neg Hx    Esophageal cancer Neg Hx    Stomach cancer Neg Hx    Rectal cancer Neg Hx    Colon polyps Neg Hx     Physical Exam: Weight 211 pounds 04/20/21 Weight  212 pounds 06/22/10 Weight 204 pounds 09/11/21 Vital signs reviewed General:   Alert,  well-nourished, pleasant and cooperative in NAD Head:  Normocephalic and atraumatic. Eyes:  Sclera clear, no icterus.   Conjunctiva pink. Abdomen:  Soft, nontender, nondistended, normal bowel sounds, no rebound or guarding. No hepatosplenomegaly.   Neurologic:  Alert and  oriented x4;  grossly nonfocal Skin:  Intact without significant lesions or rashes. Psych:  Alert and cooperative. Normal mood and affect.    Trishelle Devora L. Tarri Glenn, MD, MPH 09/11/2021, 11:14 AM

## 2021-09-11 NOTE — Patient Instructions (Addendum)
Please follow up with Dr Tarri Glenn in 4-5 months.  Although you have many reasons for weight loss, I would like you to consider having a CT of the abdomen and pelvis to complete the evaluation for GI causes given your ongoing altered bowel habits. Please call if you would like to schedule this today.  I recommend increasing your dicyclomine to 20 mg four times daily (a new prescription for the 20 mg tablet has been sent to your pharmacy)  Please stop in the lab today so that we can do testing for anemia.   Your provider has requested that you go to the basement level for lab work before leaving today. Press "B" on the elevator. The lab is located at the first door on the left as you exit the elevator.  If you are age 73 or older, your body mass index should be between 23-30. Your Body mass index is 32.01 kg/m. If this is out of the aforementioned range listed, please consider follow up with your Primary Care Provider.  If you are age 74 or younger, your body mass index should be between 19-25. Your Body mass index is 32.01 kg/m. If this is out of the aformentioned range listed, please consider follow up with your Primary Care Provider.   ________________________________________________________  The [facility] providers would like to encourage you to use [facility] to communicate with providers for non-urgent requests or questions.  Due to long hold times on the telephone, sending your provider a message by [facility] may be a faster and more efficient way to get a response.  Please allow 48 business hours for a response.  Please remember that this is for non-urgent requests.  _______________________________________________________  Due to recent changes in healthcare laws, you may see the results of your imaging and laboratory studies on MyChart before your provider has had a chance to review them.  We understand that in some cases there may be results that are confusing or concerning to you. Not all  laboratory results come back in the same time frame and the provider may be waiting for multiple results in order to interpret others.  Please give [place] 48 hours in order for your provider to thoroughly review all the results before contacting the office for clarification of your results.

## 2021-09-21 ENCOUNTER — Telehealth: Payer: Self-pay | Admitting: Cardiovascular Disease

## 2021-09-21 NOTE — Telephone Encounter (Signed)
°*  STAT* If patient is at the pharmacy, call can be transferred to refill team.   1. Which medications need to be refilled? (please list name of each medication and dose if known)   carvedilol (COREG) 12.5 MG tablet  amLODipine (NORVASC) 5 MG tablet  2. Which pharmacy/location (including street and city if local pharmacy) is medication to be sent to?  [place], [place]  Phone:  [phone] Fax:  [phone]  3. Do they need a 30 day or 90 day supply? 30 ds

## 2021-09-22 ENCOUNTER — Other Ambulatory Visit: Payer: Self-pay

## 2021-09-22 MED ORDER — AMLODIPINE BESYLATE 5 MG PO TABS: 5.0000 mg | ORAL_TABLET | Freq: Every day | ORAL | 1 refills | Status: DC

## 2021-09-22 MED ORDER — CARVEDILOL 12.5 MG PO TABS: 12.5000 mg | ORAL_TABLET | Freq: Two times a day (BID) | ORAL | 1 refills | Status: DC

## 2021-09-28 NOTE — Progress Notes (Signed)
PATIENT: Pamela [name] DOB: 21-Jan-1946  REASON FOR VISIT: Hospital follow-up in stroke clinic  HISTORY FROM: Patient and her son, Pamela [name]   HISTORY OF PRESENT ILLNESS: Today 09/29/21 Pamela [name] was admitted 08/17/21 in the ER  for severe headache, chest pain, right hand numbness.  CT showed tiny right frontal ICH and MRI revealed numerous microhemorrhages, consistent with CAA.  Her headache resolved in 2-3 hours, her chest pain and jaw pain resolved in 4-5 hours, EKG was unremarkable, troponin was negative. She was admitted for treatment of [place].  Has history of HTN, she reported not fully controlled.  She denied any speech difficulty, arm or leg weakness, or vision changes.  Reportedly rarely had a headache.  She was taking aspirin 325 mg at home.  Echocardiogram was normal.   Pamela [name] is here today with her son, Pamela [name]. Is doing well, resumed aspirin 81 mg daily 1 week after hospitalization.  No further headache, she may have occasional intermittent right hand numbness, very brief.  No falls, but does report gait instability, uses a cane.  Denies cognitive impairment.  Lives with her husband.  She does drive a car. Brings a BP log usually runs 120's/70's, CBG in 120's.  Has been watching her salt intake. Has questions about whether to get another CT scan of the head.  Per hospitalization note:  ICH: Small right frontal parenchymal ICH secondary to CAA CT head Small IPH in right frontal lobe CTA pending (08/18/21, no LVO, no stenosis or aneurysm) MRI  Acute small hemorrhage in right frontal lobe, innumerable chronic microhemorrhages consistent with CAA versus hypertensive microangiopathy 2D Echo pending (was normal 09/10/21) LDL 89 HgbA1c 6.7  HISTORY  Copied Dr. Erlinda [name] Note 08/18/21- History of Present Illness:  Pamela [name] is a 76 y.o. Caucasian female with PMH of HTN, DM, right ear hearing loss presented to ED for HA and CP.   Per patient and her husband, she had acute onset headache around 12  PM.  Headache was at top of the head, sharp pain, no photophobia, phonophobia or nausea vomiting.  About 5 to 10 minutes later, she started have chest pain, heart palpitation and bilateral jaw pain and bottom half toothache, and right hand numbness.  Over time, the pain getting better, headache resolved in 2 to 3 hours, chest pain and jaw pain resolved in 4-5 hours.  Currently patient asymptomatic.  EKG unremarkable and troponin level negative.  Due to headache, CT head was done showed right frontal tiny ICH.  MRI head then completed showed numerous microhemorrhages, consistent with CAA.  Neurology was consulted.  Patient denies any speech difficulty, arm or leg weakness, vision changes at this time.   Per patient, she seldomly has any headache.  On 07/24/2021, she was diagnosed with COVID, for which she had 2 days headache, that was only the headache she was able to remember.  However, her mom and grandma died of brain bleed, possible aneurysm but not sure.  Her sister died of stomach bleeding.   Pt husband and pt, for the last 2 years, pt gradually has gait difficulty, has hold onto things or walk with cane. No cognitive impairment noted. She had right ear sudden hearing loss, follow with Dr. Erling [name] at [facility] in the past, no etiology found, was put on aspirin 325.  She takes HCTZ, lisinopril and Coreg for BP control at home, and compliant with medication.   REVIEW OF SYSTEMS: Out of a complete 14 system review of symptoms, the patient  complains only of the following symptoms, and all other reviewed systems are negative.  See HPI  ALLERGIES: Allergies  Allergen Reactions   Demerol [Meperidine] Other (See Comments)    Unspecified    Macrobid [Nitrofurantoin Macrocrystal] Other (See Comments)    Unspecified    Macrobid [Nitrofurantoin]     Other reaction(s): hearing loss in R ear   Meclizine Other (See Comments)    Unspecified    Sulfa Antibiotics Other (See Comments)    Unspecified     HOME  MEDICATIONS: Outpatient Medications Prior to Visit  Medication Sig Dispense Refill   amLODipine (NORVASC) 5 MG tablet Take 1 tablet (5 mg total) by mouth daily. 30 tablet 1   aspirin 81 MG EC tablet 1 tablet     calcium carbonate (TUMS EX) 750 MG chewable tablet Chew 1 tablet by mouth as needed for heartburn.     carvedilol (COREG) 12.5 MG tablet Take 1 tablet (12.5 mg total) by mouth 2 (two) times daily with a meal. 60 tablet 1   cholecalciferol (VITAMIN D) 25 MCG (1000 UNIT) tablet 1,000 Units daily.     Coenzyme Q10 (CO Q 10) 100 MG CAPS Take 300 mg by mouth daily.     dicyclomine (BENTYL) 20 MG tablet Take 1 tablet (20 mg total) by mouth 4 (four) times daily -  before meals and at bedtime. 120 tablet 2   empagliflozin (JARDIANCE) 10 MG TABS tablet Take 5 mg by mouth daily.     ezetimibe (ZETIA) 10 MG tablet TAKE 1 TABLET DAILY 90 tablet 3   famotidine (PEPCID) 20 MG tablet Take 20 mg by mouth daily as needed for heartburn or indigestion.     ferrous sulfate 324 (65 Fe) MG TBEC Take 1 tablet (325 mg total) by mouth every other day. 30 tablet    glimepiride (AMARYL) 2 MG tablet Take 2 mg by mouth daily before breakfast.     hydrochlorothiazide (HYDRODIURIL) 25 MG tablet TAKE 1 TABLET DAILY 90 tablet 3   levothyroxine (SYNTHROID, LEVOTHROID) 137 MCG tablet Take 137 mcg by mouth daily before breakfast. One daily, 1 and 1/2 on Sunday     linaGLIPtin-metFORMIN HCl ER 2.01-999 MG TB24 Take 2 tablets by mouth every morning.      lisinopril (PRINIVIL,ZESTRIL) 40 MG tablet Take 40 mg by mouth daily.     metroNIDAZOLE (METROCREAM) 0.75 % cream Apply 1 application topically daily as needed (rosacea).     pantoprazole (PROTONIX) 40 MG tablet Take 1 tablet (40 mg total) by mouth 2 (two) times daily. 180 tablet 3   No facility-administered medications prior to visit.    PAST MEDICAL HISTORY: Past Medical History:  Diagnosis Date   Age-related nuclear cataract of right eye 04/15/2020   Amyloidosis  ([place])    organ limited   Arthritis    Cataract    Colon polyps    Diabetes mellitus ([place])    Diverticulosis    Gallstones    GERD (gastroesophageal reflux disease)    Hearing loss    right ear   Heart murmur    Helicobacter pylori gastritis    Hiatal hernia    Hyperlipidemia    Hypertension    Hypothyroidism    Tachycardia    Tubular adenoma of colon    Vitreomacular adhesion of right eye 04/15/2020    PAST SURGICAL HISTORY: Past Surgical History:  Procedure Laterality Date   ABDOMINAL HYSTERECTOMY     x 2   APPENDECTOMY  CHOLECYSTECTOMY     with appendectomy   COLONOSCOPY  12/16/2020   TONSILLECTOMY      FAMILY HISTORY: Family History  Problem Relation Age of Onset   Hypertension Mother        also cerebral hemorrhage   Cerebral aneurysm Mother        also cerebral hemorrhage   Hypertension Father    Diabetes Father        also Alzhemiers   Alzheimer's disease Father    Breast cancer Paternal Grandmother    CAD Maternal Grandfather        Hardening of the arteries   Cerebral aneurysm Maternal Grandfather    Hypertension Sister    Anxiety disorder Sister    Hypertension Son    Colon cancer Neg Hx    Esophageal cancer Neg Hx    Stomach cancer Neg Hx    Rectal cancer Neg Hx    Colon polyps Neg Hx     SOCIAL HISTORY: Social History   Socioeconomic History   Marital status: Married    Spouse name: Not on file   Number of children: 1   Years of education: Not on file   Highest education level: Not on file  Occupational History   Occupation: retired  Tobacco Use   Smoking status: Never   Smokeless tobacco: Never  Vaping Use   Vaping Use: Never used  Substance and Sexual Activity   Alcohol use: No   Drug use: No   Sexual activity: Not Currently    Partners: Male    Comment: intercourse- 25, partners-1, Married- 5 yrs  Other Topics Concern   Not on file  Social History Narrative   Not on file   Social Determinants of Health    Financial Resource Strain: Not on file  Food Insecurity: Not on file  Transportation Needs: Not on file  Physical Activity: Not on file  Stress: Not on file  Social Connections: Not on file  Intimate Partner Violence: Not on file   PHYSICAL EXAM  Vitals:   09/29/21 1311 09/29/21 1348  BP: (!) 146/64 130/80  Pulse: 64   Weight: 203 lb 8 oz (92.3 kg)   Height: 5\' 7"  (1.702 m)    Body mass index is 31.87 kg/m.  Generalized: Well developed, in no acute distress  Neurological examination  Mentation: Alert oriented to time, place, history taking. Follows all commands speech and language fluent Cranial nerve II-XII: Pupils were equal round reactive to light. Extraocular movements were full, visual field were full on confrontational test. Facial sensation and strength were normal. Head turning and shoulder shrug  were normal and symmetric. Motor: The motor testing reveals 5 over 5 strength of all 4 extremities. Good symmetric motor tone is noted throughout. Equal, strong grip strength bilaterally.  Sensory: Sensory testing is intact to soft touch on all 4 extremities. No evidence of extinction is noted.  Coordination: Cerebellar testing reveals good finger-nose-finger and heel-to-shin bilaterally.  Gait and station: Gait is slightly wide-based, but steady, uses a cane for ambulation Reflexes: Deep tendon reflexes are symmetric and normal bilaterally.   DIAGNOSTIC DATA (LABS, IMAGING, TESTING) - I reviewed patient records, labs, notes, testing and imaging myself where available.  Lab Results  Component Value Date   WBC 9.1 08/18/2021   HGB 11.7 (L) 09/11/2021   HCT 35.6 (L) 08/18/2021   MCV 87.9 08/18/2021   PLT 278 08/18/2021      Component Value Date/Time   NA 139 08/18/2021 0234  NA 137 03/01/2017 1024   K 3.6 08/18/2021 0234   CL 106 08/18/2021 0234   CO2 23 08/18/2021 0234   GLUCOSE 94 08/18/2021 0234   BUN 12 08/18/2021 0234   BUN 17 03/01/2017 1024   CREATININE  0.78 08/18/2021 0234   CREATININE 0.69 09/26/2014 1607   CALCIUM 8.9 08/18/2021 0234   PROT 6.0 (L) 08/18/2021 0234   PROT 6.5 03/01/2017 1024   ALBUMIN 3.2 (L) 08/18/2021 0234   ALBUMIN 3.9 03/01/2017 1024   AST 16 08/18/2021 0234   ALT 12 08/18/2021 0234   ALKPHOS 66 08/18/2021 0234   BILITOT 0.7 08/18/2021 0234   BILITOT 0.2 03/01/2017 1024   GFRNONAA >60 08/18/2021 0234   GFRAA 88 03/01/2017 1024   Lab Results  Component Value Date   CHOL 164 08/18/2021   HDL 59 08/18/2021   LDLCALC 89 08/18/2021   TRIG 79 08/18/2021   CHOLHDL 2.8 08/18/2021   Lab Results  Component Value Date   HGBA1C 6.7 (H) 08/18/2021   No results found for: VITAMINB12  IMPRESSION: 1. Unchanged subcentimeter right frontal parenchymal hemorrhage. 2. Mild atherosclerosis in the head and neck without large vessel occlusion, significant proximal stenosis, or aneurysm. 3. Aortic Atherosclerosis (ICD10-I70.0).  MRI of the brain  IMPRESSION: 1. Innumerable chronic microhemorrhages in a mixed central and peripheral pattern, consistent with amyloid angiopathy. 2. Punctate focus of acute hemorrhage in the right frontal lobe, better demonstrated on the earlier head CT. Peripheral intraparenchymal hemorrhage is in keeping with amyloid angiopathy.  ASSESSMENT AND PLAN 76 y.o. year old female  has a past medical history of Age-related nuclear cataract of right eye (04/15/2020), Amyloidosis ([place]), Arthritis, Cataract, Colon polyps, Diabetes mellitus ([place]), Diverticulosis, Gallstones, GERD (gastroesophageal reflux disease), Hearing loss, Heart murmur, Helicobacter pylori gastritis, Hiatal hernia, Hyperlipidemia, Hypertension, Hypothyroidism, Tachycardia, Tubular adenoma of colon, and Vitreomacular adhesion of right eye (04/15/2020). here with:  1.  ICH: Small right frontal parenchymal ICH secondary to CAA -Continue aspirin 81 mg daily, held for 1 week, then resumed  -CT head without contrast ordered to ensure  no worsening of bleeding, per patient request, no deficits except occasional intermittent right hand numbness a few times  -Given ER precaution for significant headache, neuro symptoms -Continue follow-up with PCP for management of vascular risk factors and secondary stroke prevention, return to [facility] for new or worsening symptoms, saw Dr. Erlinda [name] and Dr. Leonie Man in Hospital  2. HTN: Goal < 130/90 given CAA, on Coreg, HCTZ, lisinopril, amlodipine was added  3. HLD: LDL 89, goal < 70, continue Zetia, high intensity statin deferred due to CAA  4. DM: A1c 6.7, goal less than 7, on Jardiance, linagliptin/metformin  5. Obesity: recommend weight loss, diet, exercise  6.  Gait abnormality: Discussed caution with ambulation, needs to be careful not to fall, given prescription for rolling walker  Butler Denmark, AGNP-C, DNP 09/29/2021, 2:13 PM [facility] [address], [place], [place] [address] [phone]

## 2021-09-29 ENCOUNTER — Ambulatory Visit (INDEPENDENT_AMBULATORY_CARE_PROVIDER_SITE_OTHER): Payer: Medicare Other | Admitting: Neurology

## 2021-09-29 ENCOUNTER — Encounter: Payer: Self-pay | Admitting: Neurology

## 2021-09-29 ENCOUNTER — Other Ambulatory Visit: Payer: Self-pay

## 2021-09-29 ENCOUNTER — Telehealth: Payer: Self-pay | Admitting: Neurology

## 2021-09-29 VITALS — BP 130/80 | HR 64 | Ht 67.0 in | Wt 203.5 lb

## 2021-09-29 DIAGNOSIS — R269 Unspecified abnormalities of gait and mobility: Secondary | ICD-10-CM

## 2021-09-29 DIAGNOSIS — I680 Cerebral amyloid angiopathy: Secondary | ICD-10-CM

## 2021-09-29 DIAGNOSIS — I619 Nontraumatic intracerebral hemorrhage, unspecified: Secondary | ICD-10-CM

## 2021-09-29 NOTE — Patient Instructions (Addendum)
Great to see you today! Continue aspirin 81 mg daily Continue to see your primary care doctor BP goal < 130/90, LDL < 70, A1C 7 Will check CT head at [place] See you back as needed

## 2021-09-29 NOTE — Progress Notes (Signed)
I agree with the above plan 

## 2021-09-29 NOTE — Telephone Encounter (Signed)
medicare/tricare order  sent to GI, [facility] they will reach out to the patient to schedule.

## 2021-09-30 ENCOUNTER — Ambulatory Visit: Payer: Medicare Other | Admitting: Podiatry

## 2021-10-01 ENCOUNTER — Other Ambulatory Visit: Payer: Self-pay | Admitting: Cardiovascular Disease

## 2021-10-01 NOTE — Progress Notes (Signed)
Cardiology Office Note:    Date:  10/12/2021   ID:  Pamela [name], DOB 01-29-1946, MRN [id number]  PCP:  Reynold Bowen, MD  Cardiologist:  Shelva Majestic, MD  Electrophysiologist:  None   Referring MD: Reynold Bowen, MD   Chief Complaint: follow-up of chest pain  History of Present Illness:    Pamela [name] is a 76 y.o. female with a history of chest pain with normal coronaries on cardiac cath in 2010 and negative Myoview in 2014, recent acute intraparenchymal hemorrhage, hypertension, hyperlipidemia, type 2 diabetes, hypothyroidism, and GERD who is followed by Dr. Claiborne Billings presents today for follow-up of chest pain.   Patient has a history of chest pain. Remote cardiac catheterization in 05/2009 showed normal coronaries. Myoview in 2014 was low risk with mild breast attenuation but no evidence of scar or ischemia. Last Echo in 2017 showed LVEF of 60-65% with normal wall motion, grade 1 diastolic dysfunction, and trace to mild MR. She was recently admitted from 08/17/2021 to 08/18/2021 with a small acute intraparenchymal hemorrhage of the right frontal lobe. She actually presented with sharp not radiating substernal chest pain that occurred at rest and resolved on its own. Cardiac work-up in the ED was unremarkable with negative high-sensitivity troponin x2 and non-acute EKG. On admission, she reported that chest pain was associated with frontal headache and numbness into right extremity. Head CT was ordered and showed probable small focus of subarachnoid or intraparenchymal hemorrhage. Brain MRI showed innumerable chronic microhemorrhages in a mixed central and peripheral pattern consistent with amyloid angiopathy as was as punctate focus of acute hemorrhage in the right frontal lobe. Neurology was consulted and there was strong suspicion for amyloid angiopathy though hypertensive microangiopathic disease also possible. Aspirin was held for 1 week but then she was given the OK to restart this at 81mg   daily. Strict BP control with goal <130/90 was recommended.  Patient was seen by me for hospital follow-up on 08/24/2021 at which time she denied any recurrent chest pain and was doing well from a cardiac standpoint. No ischemic evaluation was ordered given no recurrence of chest pain. However, an Echo was ordered given recent recent hemorrhagic stroke pattern was concerning for amyloid angiography. Echo showed LVEF of 60-65% with normal wall motion and normal diastolic parameters but no signs concerning for amyloidosis.   Patient presents today for routine follow-up. Here with husband. Patient has done well from a cardiac standpoint since last visit. She denies any recurrent chest pain. No shortness of breath, new orthopnea, PND, lower extremity edema, palpitations, lightheadedness, dizziness, near syncope/syncope. Recent head CT on 10/05/2021 showed resolution of intraparenchymal hemorrhage. BP well controlled at home and usually <130/80.    Past Medical History:  Diagnosis Date   Age-related nuclear cataract of right eye 04/15/2020   Amyloidosis ([place])    organ limited   Arthritis    Cataract    Colon polyps    Diabetes mellitus ([place])    Diverticulosis    Gallstones    GERD (gastroesophageal reflux disease)    Hearing loss    right ear   Heart murmur    Helicobacter pylori gastritis    Hiatal hernia    Hyperlipidemia    Hypertension    Hypothyroidism    Tachycardia    Tubular adenoma of colon    Vitreomacular adhesion of right eye 04/15/2020    Past Surgical History:  Procedure Laterality Date   ABDOMINAL HYSTERECTOMY     x 2   APPENDECTOMY  CHOLECYSTECTOMY     with appendectomy   COLONOSCOPY  12/16/2020   TONSILLECTOMY      Current Medications: Current Meds  Medication Sig   aspirin 81 MG EC tablet 1 tablet   calcium carbonate (TUMS EX) 750 MG chewable tablet Chew 1 tablet by mouth as needed for heartburn.   cholecalciferol (VITAMIN D) 25 MCG (1000 UNIT) tablet  1,000 Units daily.   Coenzyme Q10 (CO Q 10) 100 MG CAPS Take 300 mg by mouth daily.   dicyclomine (BENTYL) 20 MG tablet Take 1 tablet (20 mg total) by mouth 4 (four) times daily -  before meals and at bedtime.   empagliflozin (JARDIANCE) 10 MG TABS tablet Take 5 mg by mouth daily.   ezetimibe (ZETIA) 10 MG tablet TAKE 1 TABLET DAILY   famotidine (PEPCID) 20 MG tablet Take 20 mg by mouth daily as needed for heartburn or indigestion.   ferrous sulfate 324 (65 Fe) MG TBEC Take 1 tablet (325 mg total) by mouth every other day.   glimepiride (AMARYL) 2 MG tablet Take 2 mg by mouth daily before breakfast.   levothyroxine (SYNTHROID, LEVOTHROID) 137 MCG tablet Take 137 mcg by mouth daily before breakfast. One daily, 1 and 1/2 on Sunday   linaGLIPtin-metFORMIN HCl ER 2.01-999 MG TB24 Take 2 tablets by mouth every morning.    metroNIDAZOLE (METROCREAM) 0.75 % cream Apply 1 application topically daily as needed (rosacea).   pantoprazole (PROTONIX) 40 MG tablet Take 1 tablet (40 mg total) by mouth 2 (two) times daily.   [DISCONTINUED] amLODipine (NORVASC) 5 MG tablet Take 1 tablet (5 mg total) by mouth daily.   [DISCONTINUED] carvedilol (COREG) 12.5 MG tablet Take 1 tablet (12.5 mg total) by mouth 2 (two) times daily with a meal.   [DISCONTINUED] hydrochlorothiazide (HYDRODIURIL) 25 MG tablet TAKE 1 TABLET DAILY   [DISCONTINUED] lisinopril (PRINIVIL,ZESTRIL) 40 MG tablet Take 40 mg by mouth daily.     Allergies:   Demerol [meperidine], Macrobid [nitrofurantoin macrocrystal], Macrobid [nitrofurantoin], Meclizine, and Sulfa antibiotics   Social History   Socioeconomic History   Marital status: Married    Spouse name: Not on file   Number of children: 1   Years of education: Not on file   Highest education level: Not on file  Occupational History   Occupation: retired  Tobacco Use   Smoking status: Never   Smokeless tobacco: Never  Vaping Use   Vaping Use: Never used  Substance and Sexual  Activity   Alcohol use: No   Drug use: No   Sexual activity: Not Currently    Partners: Male    Comment: intercourse- 39, partners-1, Married- 45 yrs  Other Topics Concern   Not on file  Social History Narrative   Not on file   Social Determinants of Health   Financial Resource Strain: Not on file  Food Insecurity: Not on file  Transportation Needs: Not on file  Physical Activity: Not on file  Stress: Not on file  Social Connections: Not on file     Family History: The patient's family history includes Alzheimer's disease in her father; Anxiety disorder in her sister; Breast cancer in her paternal grandmother; CAD in her maternal grandfather; Cerebral aneurysm in her maternal grandfather and mother; Diabetes in her father; Hypertension in her father, mother, sister, and son. There is no history of Colon cancer, Esophageal cancer, Stomach cancer, Rectal cancer, or Colon polyps.  ROS:   Please see the history of present illness.  EKGs/Labs/Other Studies Reviewed:    The following studies were reviewed today:  Myoview 12/07/2012: Impression: Exercise Capacity:  Lexiscan with no exercise. BP Response:  Normal blood pressure response. Clinical Symptoms:  There is dyspnea. ECG Impression:  No significant ECG changes with Lexiscan. Comparison with Prior Nuclear Study: No previous nuclear study performed LV Wall Motion:  NL LV Function; NL Wall Motion   Overall Impression:  Normal stress nuclear study with mild breast attenuation; Low risk stress nuclear study. _______________  Echocardiogram 09/10/2021: Impressions:  1. Left ventricular ejection fraction, by estimation, is 60 to 65%. The  left ventricle has normal function. The left ventricle has no regional  wall motion abnormalities. Left ventricular diastolic parameters were  normal. The average left ventricular  global longitudinal strain is -21.0 %. The global longitudinal strain is  normal.   2. Right ventricular  systolic function is normal. The right ventricular  size is normal. Tricuspid regurgitation signal is inadequate for assessing  PA pressure.   3. The mitral valve is grossly normal. No evidence of mitral valve  regurgitation. No evidence of mitral stenosis.   4. The aortic valve is tricuspid. Aortic valve regurgitation is not  visualized. No aortic stenosis is present.   5. The inferior vena cava is normal in size with greater than 50%  respiratory variability, suggesting right atrial pressure of 3 mmHg.  EKG:  EKG not ordered today.   Recent Labs: 08/18/2021: ALT 12; BUN 12; Creatinine, Ser 0.78; Magnesium 1.4; Platelets 278; Potassium 3.6; Sodium 139 09/11/2021: Hemoglobin 11.7  Recent Lipid Panel    Component Value Date/Time   CHOL 164 08/18/2021 0234   CHOL 177 03/01/2017 1024   CHOL 140 04/17/2013 0856   TRIG 79 08/18/2021 0234   TRIG 159 (H) 04/17/2013 0856   HDL 59 08/18/2021 0234   HDL 62 03/01/2017 1024   HDL 46 04/17/2013 0856   CHOLHDL 2.8 08/18/2021 0234   VLDL 16 08/18/2021 0234   LDLCALC 89 08/18/2021 0234   LDLCALC 89 03/01/2017 1024   LDLCALC 62 04/17/2013 0856    Physical Exam:    Vital Signs: BP (!) 132/58    Pulse (!) 51    Ht 5\' 7"  (1.702 m)    Wt 203 lb (92.1 kg)    SpO2 100%    BMI 31.79 kg/m     Wt Readings from Last 3 Encounters:  10/12/21 203 lb (92.1 kg)  09/29/21 203 lb 8 oz (92.3 kg)  09/11/21 204 lb 6 oz (92.7 kg)     General: 76 y.o. Caucasian female in no acute distress. HEENT: Normocephalic and atraumatic. Sclera clear. EOMs intact. Neck: Supple. No carotid bruits. No JVD. Heart: RRR. Distinct S1 and S2. No murmurs, gallops, or rubs. Radial pulses 2+ and equal bilaterally. Lungs: No increased work of breathing. Clear to ausculation bilaterally. No wheezes, rhonchi, or rales.  Abdomen: Soft, non-distended, and non-tender to palpation.  MSK: Ambulates with a cain. Extremities: No lower extremity edema.    Skin: Warm and dry. Neuro:  Alert and oriented x3. No focal deficits. Psych: Normal affect. Responds appropriately.  Assessment:    1. History of chest pain   2. Primary hypertension   3. Hyperlipidemia, unspecified hyperlipidemia type   4. Type 2 diabetes mellitus with complication, without long-term current use of insulin (HCC)     Plan:    History of Chest Pain History of chest pain. Remote cardiac cath in 2010 showed normal coronaries. Myoview in 2014 showed no evidence  of ischemia. She was admitted in 08/2021 with multiple symptoms including chest pain and found to have acute small intraparenchymal hemorrhage of the right frontal lobe. EKG showed no acute changes and high-sensitivity troponin negative x2. Echo showed normal LV function. - No recurrent chest pain. - Given no recurrence, will hold off on any additional ischemic evaluation at this time. If she had recurrent chest pain, would recommend a coronary CTA.   Hypertension BP well controlled. - Continue current medications: Amlodipine 5mg  daily, Coreg 12.5mg  twice daily, HCTZ 25mg  daily, and Lisinopril 40mg  daily.  - Advised patient to continue to monitor BP at home and notify us if consistently above 130/80.   Hyperlipidemia Recent lipid panel on 08/18/2021:  Total Cholesterol 164, Triglycerides 79, HDL 59, LDL 89. LDL goal <70. - Intolerant to statins in the past. Continue Zetia 10mg  daily. Also on Coenzyme Q10 300mg  daily. Insurance previously would not approve Bempedoic acid in the past.  - If LDL not at goal on next check, can discuss PCSK9 inhibitor. Did not discuss today as patient already feels like she is on too many medications and requested to come off some.    Type 2 Diabetes Mellitus Hemoglobin A1c 6.7% in 08/2021. - Blood sugar has been well controlled at home. - On Glimepiride, Jardiance, Linagliptin-Metformin.  - Management per PCP.  Disposition: Follow up in 1 year.   Medication Adjustments/Labs and Tests Ordered: Current  medicines are reviewed at length with the patient today.  Concerns regarding medicines are outlined above.  No orders of the defined types were placed in this encounter.  Meds ordered this encounter  Medications   carvedilol (COREG) 12.5 MG tablet    Sig: Take 1 tablet (12.5 mg total) by mouth 2 (two) times daily with a meal.    Dispense:  60 tablet    Refill:  1   amLODipine (NORVASC) 5 MG tablet    Sig: Take 1 tablet (5 mg total) by mouth daily.    Dispense:  30 tablet    Refill:  1   lisinopril (ZESTRIL) 40 MG tablet    Sig: Take 1 tablet (40 mg total) by mouth daily.    Dispense:  90 tablet    Refill:  3   hydrochlorothiazide (HYDRODIURIL) 25 MG tablet    Sig: Take 1 tablet (25 mg total) by mouth daily.    Dispense:  90 tablet    Refill:  3    Patient Instructions  Medication Instructions:  Your physician recommends that you continue on your current medications as directed. Please refer to the Current Medication list given to you today.   *If you need a refill on your cardiac medications before your next appointment, please call your pharmacy*   Lab Work: NONE ordered at this time of appointment    Testing/Procedures: NONE ordered at this time of appointment     Follow-Up: At [facility], you and your health needs are our priority.  As part of our continuing mission to provide you with exceptional heart care, we have created designated Provider Care Teams.  These Care Teams include your primary Cardiologist (physician) and Advanced Practice Providers (APPs -  Physician Assistants and Nurse Practitioners) who all work together to provide you with the care you need, when you need it.  We recommend signing up for the patient portal called "MyChart".  Sign up information is provided on this After Visit Summary.  MyChart is used to connect with patients for Virtual Visits (Telemedicine).  Patients  are able to view lab/test results, encounter notes, upcoming appointments,  etc.  Non-urgent messages can be sent to your provider as well.   To learn more about what you can do with MyChart, go to [email or web address].    Your next appointment:   1 year(s)  The format for your next appointment:   In Person  Provider:   Shelva Majestic, MD     Other Instructions    Signed, Darreld Mclean, PA-C  10/12/2021 3:35 PM    [place]

## 2021-10-05 ENCOUNTER — Ambulatory Visit
Admission: RE | Admit: 2021-10-05 | Discharge: 2021-10-05 | Disposition: A | Discharge status: Home / Self Care | Payer: Medicare Other | Source: Ambulatory Visit | Attending: Neurology | Admitting: Neurology

## 2021-10-05 DIAGNOSIS — I619 Nontraumatic intracerebral hemorrhage, unspecified: Secondary | ICD-10-CM | POA: Diagnosis not present

## 2021-10-05 DIAGNOSIS — I680 Cerebral amyloid angiopathy: Secondary | ICD-10-CM | POA: Diagnosis not present

## 2021-10-06 ENCOUNTER — Telehealth: Payer: Self-pay | Admitting: Neurology

## 2021-10-06 NOTE — Telephone Encounter (Signed)
Please try to reach the patient and her let her know that CT scan shows the bleeding on the brain has resolved as expected. I tried to call but there was no answer. Thanks!    IMPRESSION: This is a normal noncontrasted CT scan of the head.  The very small right frontal hemorrhage noted on the CT scan from 08/17/2021 has resolved.

## 2021-10-06 NOTE — Telephone Encounter (Signed)
I called patient. I discussed her CT head results. Patient was very appreciative and understood the results. Pt had no questions at this time but was encouraged to call back if questions arise.

## 2021-10-12 ENCOUNTER — Ambulatory Visit: Payer: Medicare Other | Admitting: Student

## 2021-10-12 ENCOUNTER — Encounter: Payer: Self-pay | Admitting: Student

## 2021-10-12 ENCOUNTER — Ambulatory Visit (INDEPENDENT_AMBULATORY_CARE_PROVIDER_SITE_OTHER): Payer: Medicare Other | Admitting: Student

## 2021-10-12 ENCOUNTER — Other Ambulatory Visit: Payer: Self-pay

## 2021-10-12 VITALS — BP 132/58 | HR 51 | Ht 67.0 in | Wt 203.0 lb

## 2021-10-12 DIAGNOSIS — E118 Type 2 diabetes mellitus with unspecified complications: Secondary | ICD-10-CM | POA: Diagnosis not present

## 2021-10-12 DIAGNOSIS — I10 Essential (primary) hypertension: Secondary | ICD-10-CM | POA: Diagnosis not present

## 2021-10-12 DIAGNOSIS — Z87898 Personal history of other specified conditions: Secondary | ICD-10-CM | POA: Diagnosis not present

## 2021-10-12 DIAGNOSIS — E785 Hyperlipidemia, unspecified: Secondary | ICD-10-CM | POA: Diagnosis not present

## 2021-10-12 MED ORDER — HYDROCHLOROTHIAZIDE 25 MG PO TABS: 25.0000 mg | ORAL_TABLET | Freq: Every day | ORAL | 3 refills | Status: DC

## 2021-10-12 MED ORDER — AMLODIPINE BESYLATE 5 MG PO TABS: 5.0000 mg | ORAL_TABLET | Freq: Every day | ORAL | 1 refills | Status: DC

## 2021-10-12 MED ORDER — LISINOPRIL 40 MG PO TABS: 40.0000 mg | ORAL_TABLET | Freq: Every day | ORAL | 3 refills | Status: DC

## 2021-10-12 MED ORDER — CARVEDILOL 12.5 MG PO TABS: 12.5000 mg | ORAL_TABLET | Freq: Two times a day (BID) | ORAL | 1 refills | Status: DC

## 2021-10-12 NOTE — Patient Instructions (Signed)
Medication Instructions:  Your physician recommends that you continue on your current medications as directed. Please refer to the Current Medication list given to you today.   *If you need a refill on your cardiac medications before your next appointment, please call your pharmacy*   Lab Work: NONE ordered at this time of appointment    Testing/Procedures: NONE ordered at this time of appointment     Follow-Up: At [facility], you and your health needs are our priority.  As part of our continuing mission to provide you with exceptional heart care, we have created designated Provider Care Teams.  These Care Teams include your primary Cardiologist (physician) and Advanced Practice Providers (APPs -  Physician Assistants and Nurse Practitioners) who all work together to provide you with the care you need, when you need it.  We recommend signing up for the patient portal called "MyChart".  Sign up information is provided on this After Visit Summary.  MyChart is used to connect with patients for Virtual Visits (Telemedicine).  Patients are able to view lab/test results, encounter notes, upcoming appointments, etc.  Non-urgent messages can be sent to your provider as well.   To learn more about what you can do with MyChart, go to [email or web address].    Your next appointment:   1 year(s)  The format for your next appointment:   In Person  Provider:   Shelva Majestic, MD     Other Instructions

## 2021-10-13 ENCOUNTER — Telehealth: Payer: Self-pay | Admitting: Cardiovascular Disease

## 2021-10-13 ENCOUNTER — Ambulatory Visit (INDEPENDENT_AMBULATORY_CARE_PROVIDER_SITE_OTHER): Payer: Medicare Other | Admitting: Podiatry

## 2021-10-13 DIAGNOSIS — M79675 Pain in left toe(s): Secondary | ICD-10-CM

## 2021-10-13 DIAGNOSIS — B351 Tinea unguium: Secondary | ICD-10-CM

## 2021-10-13 DIAGNOSIS — M79674 Pain in right toe(s): Secondary | ICD-10-CM | POA: Diagnosis not present

## 2021-10-13 NOTE — Telephone Encounter (Signed)
°*  STAT* If patient is at the pharmacy, call can be transferred to refill team.   1. Which medications need to be refilled? (please list name of each medication and dose if known) carvedilol (COREG) 12.5 MG tablet  90 ds   amLODipine (NORVASC) 5 MG tablet  90 ds  2. Which pharmacy/location (including street and city if local pharmacy) is medication to be sent to?[place], [place]  Phone:  [phone] Fax:  [phone]   3. Do they need a 30 day or 90 day supply? 90 ds     Patient states these medications shouldve been called in as a 90 ds and instead were sent over as a 30 ds.. please contact the pharmacy @[phone]  to correct.

## 2021-10-14 NOTE — Telephone Encounter (Signed)
Refills sent to [facility] 10/12/21.

## 2021-10-16 ENCOUNTER — Telehealth: Payer: Self-pay | Admitting: Gastroenterology

## 2021-10-16 ENCOUNTER — Other Ambulatory Visit: Payer: Self-pay

## 2021-10-16 DIAGNOSIS — I10 Essential (primary) hypertension: Secondary | ICD-10-CM

## 2021-10-16 DIAGNOSIS — R634 Abnormal weight loss: Secondary | ICD-10-CM

## 2021-10-16 DIAGNOSIS — K529 Noninfective gastroenteritis and colitis, unspecified: Secondary | ICD-10-CM

## 2021-10-16 DIAGNOSIS — K219 Gastro-esophageal reflux disease without esophagitis: Secondary | ICD-10-CM

## 2021-10-16 DIAGNOSIS — D649 Anemia, unspecified: Secondary | ICD-10-CM

## 2021-10-16 DIAGNOSIS — K921 Melena: Secondary | ICD-10-CM

## 2021-10-16 NOTE — Telephone Encounter (Signed)
Inbound call from patient states she would like to have the CT of Abdomen and Pelvis as discussed at visit 12/30

## 2021-10-16 NOTE — Telephone Encounter (Signed)
Reviewed Dr. Tarri Glenn office notes. Orders placed for CT A/P and CMP. Message sent to Rhys Martini and April Pait for scheduling purposes. Called pt to inform about orders, remind to complete labs PRIOR to CT and to provide her with Radiology Central Scheduling contact # as well for pt to self schedule [phone]). Unable to reach d/t no answer.

## 2021-10-18 ENCOUNTER — Encounter: Payer: Self-pay | Admitting: Podiatry

## 2021-10-18 NOTE — Progress Notes (Addendum)
°  Subjective:  Patient ID: Pamela [name], female    DOB: 1945/11/13,  MRN: [id number]  Pamela [name] presents to clinic today for preventative diabetic foot care and painful elongated mycotic toenails 1-5 bilaterally which are tender when wearing enclosed shoe gear. Pain is relieved with periodic professional debridement.  New problem(s): Patient states she has had a stroke since her last visit with [place]. She states she is recovering and is being followed by Neurology.  PCP is Reynold Bowen, MD , and last visit was 07/09/2020.  Allergies  Allergen Reactions   Demerol [Meperidine] Other (See Comments)    Unspecified    Macrobid [Nitrofurantoin Macrocrystal] Other (See Comments)    Unspecified    Macrobid [Nitrofurantoin]     Other reaction(s): hearing loss in R ear   Meclizine Other (See Comments)    Unspecified    Sulfa Antibiotics Other (See Comments)    Unspecified     Review of Systems: Negative except as noted in the HPI. Objective:   Constitutional Pamela [name] is a pleasant 76 y.o. Caucasian female, WD, WN in NAD. AAO x 3.   Vascular Capillary refill time to digits immediate b/l. Palpable DP pulse(s) b/l LE. Palpable PT pulse(s) b/l LE. No pain with calf compression b/l. No edema noted b/l LE. No cyanosis or clubbing noted b/l LE.  Neurologic Normal speech. Oriented to person, place, and time. Protective sensation intact 5/5 intact bilaterally with 10g monofilament b/l.  Dermatologic No open wounds b/l LE. No interdigital macerations noted b/l LE. Toenails 1-5 bilaterally elongated, discolored, dystrophic, thickened, and crumbly with subungual debris and tenderness to dorsal palpation. No hyperkeratotic nor porokeratotic lesions present on today's visit.  Orthopedic: Muscle strength 5/5 to all lower extremity muscle groups bilaterally. No gross bony deformities bilaterally. HAV with bunion deformity noted b/l LE. Pes planus deformity noted bilateral LE. Patient ambulates  independent of any assistive aids.   Radiographs: None  Last A1c:  Hemoglobin A1C Latest Ref Rng & Units 08/18/2021  HGBA1C 4.8 - 5.6 % 6.7(H)  Some recent data might be hidden   Assessment:   1. Pain due to onychomycosis of toenails of both feet    Plan:  Patient was evaluated and treated and all questions answered. Consent given for treatment as described below: -Examined patient. -Continue foot and shoe inspections daily. Monitor blood glucose per PCP/Endocrinologist's recommendations. -Toenails 1-5 b/l were debrided in length and girth with sterile nail nippers and dremel without iatrogenic bleeding.  -Patient/POA to call should there be question/concern in the interim.  Return in about 9 weeks (around 12/15/2021) for nail trim.  Marzetta Board, DPM

## 2021-10-19 NOTE — Telephone Encounter (Signed)
Pt returned call. Informed about orders for CT and labs. Provided pt with contact # for self scheduling of CT. Reminded she complete her labs BEFORE the date of her CT. Verbalized acceptance and understanding.

## 2021-10-19 NOTE — Telephone Encounter (Signed)
LVM requesting returned call 

## 2021-10-21 ENCOUNTER — Other Ambulatory Visit (INDEPENDENT_AMBULATORY_CARE_PROVIDER_SITE_OTHER): Payer: Medicare Other

## 2021-10-21 DIAGNOSIS — K921 Melena: Secondary | ICD-10-CM

## 2021-10-21 DIAGNOSIS — I10 Essential (primary) hypertension: Secondary | ICD-10-CM | POA: Diagnosis not present

## 2021-10-21 DIAGNOSIS — Z23 Encounter for immunization: Secondary | ICD-10-CM | POA: Diagnosis not present

## 2021-10-21 DIAGNOSIS — K219 Gastro-esophageal reflux disease without esophagitis: Secondary | ICD-10-CM | POA: Diagnosis not present

## 2021-10-21 DIAGNOSIS — D649 Anemia, unspecified: Secondary | ICD-10-CM | POA: Diagnosis not present

## 2021-10-21 DIAGNOSIS — R634 Abnormal weight loss: Secondary | ICD-10-CM

## 2021-10-21 DIAGNOSIS — K529 Noninfective gastroenteritis and colitis, unspecified: Secondary | ICD-10-CM | POA: Diagnosis not present

## 2021-10-21 DIAGNOSIS — L570 Actinic keratosis: Secondary | ICD-10-CM | POA: Diagnosis not present

## 2021-10-21 DIAGNOSIS — L820 Inflamed seborrheic keratosis: Secondary | ICD-10-CM | POA: Diagnosis not present

## 2021-10-21 DIAGNOSIS — D485 Neoplasm of uncertain behavior of skin: Secondary | ICD-10-CM | POA: Diagnosis not present

## 2021-10-21 DIAGNOSIS — B079 Viral wart, unspecified: Secondary | ICD-10-CM | POA: Diagnosis not present

## 2021-10-21 NOTE — Telephone Encounter (Signed)
Pt completed labs today (10/21/21). CT scheduled as follows:  Appointment Information  Name: Pamela [name], Pamela [name] MRN: [id number]  Date: 10/26/2021 Status: Sch  Time: 4:00 PM Length: 30  Visit Type: CT ABDOMEN PELVIS W CONTRAST [[id number]] Copay: $0.00  Provider: [facility] 2 Department: Raelyn Ensign  Referring Provider: [place] CSN: [id number]  Notes:    Made On: 10/19/2021 3:56 PM By: Jillyn Hidden

## 2021-10-22 LAB — COMPREHENSIVE METABOLIC PANEL
ALT: 10 U/L (ref 0–35)
AST: 13 U/L (ref 0–37)
Albumin: 3.9 g/dL (ref 3.5–5.2)
Alkaline Phosphatase: 75 U/L (ref 39–117)
BUN: 20 mg/dL (ref 6–23)
CO2: 26 mEq/L (ref 19–32)
Calcium: 9.4 mg/dL (ref 8.4–10.5)
Chloride: 106 mEq/L (ref 96–112)
Creatinine, Ser: 1.03 mg/dL (ref 0.40–1.20)
GFR: 52.91 mL/min — ABNORMAL LOW (ref >60.00)
Glucose, Bld: 101 mg/dL — ABNORMAL HIGH (ref 70–99)
Potassium: 4.4 mEq/L (ref 3.5–5.1)
Sodium: 142 mEq/L (ref 135–145)
Total Bilirubin: 0.3 mg/dL (ref 0.2–1.2)
Total Protein: 7 g/dL (ref 6.0–8.3)

## 2021-10-26 ENCOUNTER — Ambulatory Visit (HOSPITAL_COMMUNITY): Payer: Medicare Other

## 2021-10-27 NOTE — Telephone Encounter (Signed)
Cancel Rsn: Patient (cancel for now per patient)   Pt canceled CT scan and did not reschedule. Has f/u appt scheduled 12/15/21. Will need to ensure pt has completed CT prior to appt.

## 2021-10-28 ENCOUNTER — Other Ambulatory Visit: Payer: Medicare Other

## 2021-11-03 NOTE — Telephone Encounter (Signed)
At the time of this entry, pt has not rescheduled CT scan. Reminder letter mailed advising pt to call Radiology to reschedule.

## 2021-11-10 ENCOUNTER — Telehealth: Payer: Self-pay | Admitting: Cardiovascular Disease

## 2021-11-10 ENCOUNTER — Other Ambulatory Visit: Payer: Self-pay

## 2021-11-10 MED ORDER — AMLODIPINE BESYLATE 5 MG PO TABS: 5.0000 mg | ORAL_TABLET | Freq: Every day | ORAL | 1 refills | Status: DC

## 2021-11-10 MED ORDER — CARVEDILOL 12.5 MG PO TABS: 12.5000 mg | ORAL_TABLET | Freq: Two times a day (BID) | ORAL | 1 refills | Status: DC

## 2021-11-10 MED ORDER — CARVEDILOL 12.5 MG PO TABS: 12.5000 mg | ORAL_TABLET | Freq: Two times a day (BID) | ORAL | 3 refills | Status: DC

## 2021-11-10 MED ORDER — AMLODIPINE BESYLATE 5 MG PO TABS: 5.0000 mg | ORAL_TABLET | Freq: Every day | ORAL | 3 refills | Status: DC

## 2021-11-10 NOTE — Telephone Encounter (Signed)
° ° °*  STAT* If patient is at the pharmacy, call can be transferred to refill team.   1. Which medications need to be refilled? (please list name of each medication and dose if known) amLODipine (NORVASC) 5 MG tablet carvedilol (COREG) 12.5 MG tablet  2. Which pharmacy/location (including street and city if local pharmacy) is medication to be sent to? [facility], [place]  3. Do they need a 30 day or 90 day supply? 90 days  Pt needs 90 days supply because its cheaper to get it that way

## 2021-11-10 NOTE — Telephone Encounter (Signed)
Follow Up:     Patient says medicine was called in again for 30 days. It needs to be for 90 days, please. She says it cheaper to get 90 days.  that is what her insurance pays for.

## 2021-11-10 NOTE — Telephone Encounter (Signed)
90 day supply with 3 refills of Carvedilol (Coreg) and Amlodipine (Norvasc) sent to [facility].

## 2021-11-10 NOTE — Telephone Encounter (Signed)
Follow Up:   Please do 90 days for all her medicine

## 2021-12-08 DIAGNOSIS — I10 Essential (primary) hypertension: Secondary | ICD-10-CM | POA: Diagnosis not present

## 2021-12-08 DIAGNOSIS — E1142 Type 2 diabetes mellitus with diabetic polyneuropathy: Secondary | ICD-10-CM | POA: Diagnosis not present

## 2021-12-08 DIAGNOSIS — E039 Hypothyroidism, unspecified: Secondary | ICD-10-CM | POA: Diagnosis not present

## 2021-12-08 DIAGNOSIS — R0789 Other chest pain: Secondary | ICD-10-CM | POA: Diagnosis not present

## 2021-12-08 DIAGNOSIS — D649 Anemia, unspecified: Secondary | ICD-10-CM | POA: Diagnosis not present

## 2021-12-08 DIAGNOSIS — E785 Hyperlipidemia, unspecified: Secondary | ICD-10-CM | POA: Diagnosis not present

## 2021-12-08 DIAGNOSIS — E559 Vitamin D deficiency, unspecified: Secondary | ICD-10-CM | POA: Diagnosis not present

## 2021-12-08 DIAGNOSIS — N1831 Chronic kidney disease, stage 3a: Secondary | ICD-10-CM | POA: Diagnosis not present

## 2021-12-08 DIAGNOSIS — I129 Hypertensive chronic kidney disease with stage 1 through stage 4 chronic kidney disease, or unspecified chronic kidney disease: Secondary | ICD-10-CM | POA: Diagnosis not present

## 2021-12-09 ENCOUNTER — Other Ambulatory Visit: Payer: Self-pay

## 2021-12-09 ENCOUNTER — Telehealth: Payer: Self-pay

## 2021-12-09 DIAGNOSIS — I10 Essential (primary) hypertension: Secondary | ICD-10-CM

## 2021-12-09 NOTE — Telephone Encounter (Signed)
To date, pt still has not yet scheduled CT. Per Dr. Tarri Glenn: ? ?Pt will need to reschedule her follow-up appointment until after she is able to have her CT scan.  ? ?Thank you.  ? ?KLB  ? ?Per Dr. Tarri Glenn request, called pt to provide [facility] Scheduling contact # [phone] to schedule CT. In addition, will need to reschedule appt to allow ample time for pt to schedule and complete CT. ?

## 2021-12-09 NOTE — Telephone Encounter (Signed)
Pt returned call. Informed of need to cancel f/u appt and her need to schedule AND complete CT prior to f/u appt. Provided pt with [facility] Scheduling contact #. Advised to reschedule CT, then call to reschedule f/u appt. Also reminded that her labs have expired and will need to be repeat prior to scheduling CT. Advised orders have already been placed for her to walk into lab for completion. Verbalized acceptance and understanding. ?

## 2021-12-15 ENCOUNTER — Ambulatory Visit: Payer: Medicare Other | Admitting: Gastroenterology

## 2021-12-21 ENCOUNTER — Other Ambulatory Visit: Payer: Self-pay

## 2021-12-21 ENCOUNTER — Encounter: Payer: Self-pay | Admitting: Gastroenterology

## 2021-12-21 DIAGNOSIS — R634 Abnormal weight loss: Secondary | ICD-10-CM

## 2021-12-22 ENCOUNTER — Ambulatory Visit (INDEPENDENT_AMBULATORY_CARE_PROVIDER_SITE_OTHER): Payer: Medicare Other | Admitting: Podiatry

## 2021-12-22 ENCOUNTER — Encounter: Payer: Self-pay | Admitting: Podiatry

## 2021-12-22 DIAGNOSIS — B351 Tinea unguium: Secondary | ICD-10-CM

## 2021-12-22 DIAGNOSIS — M79674 Pain in right toe(s): Secondary | ICD-10-CM

## 2021-12-22 DIAGNOSIS — M79675 Pain in left toe(s): Secondary | ICD-10-CM | POA: Diagnosis not present

## 2021-12-28 NOTE — Progress Notes (Signed)
Subjective: ?Pamela [name] is a 76 y.o. female patient seen today for follow up of  preventative diabetic foot care and painful thick toenails that are difficult to trim. Pain interferes with ambulation. Aggravating factors include wearing enclosed shoe gear. Pain is relieved with periodic professional debridement..  ? ?Patient states last A1c was 5.9%.  ? ?Patient did not check blood glucose on today. ? ?New problem(s)/concern(s) today: None   ? ?PCP is Reynold Bowen, MD. Last visit was: 12/08/2021. ? ?Her son is present during today's visit. ? ?Allergies  ?Allergen Reactions  ? Demerol [Meperidine] Other (See Comments)  ?  Unspecified ?  ? Macrobid [Nitrofurantoin Macrocrystal] Other (See Comments)  ?  Unspecified ?  ? Macrobid [Nitrofurantoin]   ?  Other reaction(s): hearing loss in R ear  ? Meclizine Other (See Comments)  ?  Unspecified ?  ? Sulfa Antibiotics Other (See Comments)  ?  Unspecified ?  ? ?Objective: ?Physical Exam ? ?Constitutional Pamela [name] is a pleasant 76 y.o. Caucasian female, WD, WN in NAD. AAO x 3.   ?Vascular Capillary refill time to digits immediate b/l. Palpable DP pulse(s) b/l LE. Palpable PT pulse(s) b/l LE. No pain with calf compression b/l. No edema noted b/l LE. No cyanosis or clubbing noted b/l LE.  ?Neurologic Normal speech. Oriented to person, place, and time. Protective sensation intact 5/5 intact bilaterally with 10g monofilament b/l.  ?Dermatologic No open wounds b/l LE. No interdigital macerations noted b/l LE. Toenails 1-5 bilaterally elongated, discolored, dystrophic, thickened, and crumbly with subungual debris and tenderness to dorsal palpation. No hyperkeratotic nor porokeratotic lesions present on today's visit.  ?Orthopedic: Muscle strength 5/5 to all lower extremity muscle groups bilaterally. No gross bony deformities bilaterally. HAV with bunion deformity noted b/l LE. Pes planus deformity noted bilateral LE. Patient ambulates independent of any assistive aids.   ? ?Radiographs: None ? ?Assessment: ?1. Pain due to onychomycosis of toenails of both feet   ? ?Plan: ?-Patient was evaluated and treated. All patient's and/or POA's questions/concerns answered on today's visit. ?-Patient to continue soft, supportive shoe gear daily. ?-Toenails 1-5 b/l were debrided in length and girth with sterile nail nippers and dremel without iatrogenic bleeding.  ?-Patient/POA to call should there be question/concern in the interim. ? ?Return in about 9 weeks (around 02/23/2022). ? ?Marzetta Board, DPM ?

## 2022-01-04 ENCOUNTER — Telehealth: Payer: Self-pay | Admitting: Gastroenterology

## 2022-01-04 NOTE — Telephone Encounter (Signed)
Patient called requesting a lab order to be placed for her to do prior to CT appt. Please call her to advise. ?

## 2022-01-04 NOTE — Telephone Encounter (Signed)
Orders were placed back in March 2023 with expiration date of March 2024. No need to place orders. Pt just needs to complete as she is able. ?

## 2022-01-07 ENCOUNTER — Other Ambulatory Visit (INDEPENDENT_AMBULATORY_CARE_PROVIDER_SITE_OTHER): Payer: Medicare Other

## 2022-01-07 DIAGNOSIS — I10 Essential (primary) hypertension: Secondary | ICD-10-CM | POA: Diagnosis not present

## 2022-01-07 LAB — COMPREHENSIVE METABOLIC PANEL
ALT: 10 U/L (ref 0–35)
AST: 14 U/L (ref 0–37)
Albumin: 3.9 g/dL (ref 3.5–5.2)
Alkaline Phosphatase: 83 U/L (ref 39–117)
BUN: 19 mg/dL (ref 6–23)
CO2: 24 mEq/L (ref 19–32)
Calcium: 9.3 mg/dL (ref 8.4–10.5)
Chloride: 104 mEq/L (ref 96–112)
Creatinine, Ser: 0.92 mg/dL (ref 0.40–1.20)
GFR: 60.5 mL/min (ref >60.00)
Glucose, Bld: 147 mg/dL — ABNORMAL HIGH (ref 70–99)
Potassium: 4.3 mEq/L (ref 3.5–5.1)
Sodium: 138 mEq/L (ref 135–145)
Total Bilirubin: 0.3 mg/dL (ref 0.2–1.2)
Total Protein: 7 g/dL (ref 6.0–8.3)

## 2022-01-11 ENCOUNTER — Telehealth: Payer: Self-pay | Admitting: Gastroenterology

## 2022-01-11 NOTE — Telephone Encounter (Signed)
Patient called regarding her recent lab results to see if she would be able to do the CT scan scheduled for 5/3.  Please call patient and advise. ?

## 2022-01-11 NOTE — Telephone Encounter (Signed)
Thornton Park, MD  ?01/08/2022  5:31 PM EDT Back to Top  ?  ?Pre-CT labs.  May proceed with CT as planned.  ? ?Called pt and informed of Dr. Tarri Glenn response above. ?

## 2022-01-13 ENCOUNTER — Encounter (HOSPITAL_COMMUNITY): Payer: Self-pay

## 2022-01-13 ENCOUNTER — Ambulatory Visit (HOSPITAL_COMMUNITY)
Admission: RE | Admit: 2022-01-13 | Discharge: 2022-01-13 | Disposition: A | Discharge status: Home / Self Care | Payer: Medicare Other | Source: Ambulatory Visit | Attending: Gastroenterology | Admitting: Gastroenterology

## 2022-01-13 DIAGNOSIS — I700 Atherosclerosis of aorta: Secondary | ICD-10-CM | POA: Diagnosis not present

## 2022-01-13 DIAGNOSIS — R109 Unspecified abdominal pain: Secondary | ICD-10-CM | POA: Diagnosis not present

## 2022-01-13 DIAGNOSIS — R634 Abnormal weight loss: Secondary | ICD-10-CM | POA: Diagnosis not present

## 2022-01-13 MED ORDER — IOHEXOL 300 MG/ML  SOLN
100.0000 mL | Freq: Once | INTRAMUSCULAR | Status: AC | PRN
  Administered 2022-01-13: 100 mL via INTRAVENOUS

## 2022-01-13 MED ORDER — IOHEXOL 9 MG/ML PO SOLN: 1000.0000 mL | ORAL | Status: AC

## 2022-01-13 MED ORDER — SODIUM CHLORIDE (PF) 0.9 % IJ SOLN
INTRAMUSCULAR | Status: AC
  Filled 2022-01-13: qty 50

## 2022-01-13 MED ORDER — IOHEXOL 9 MG/ML PO SOLN
ORAL | Status: AC
  Administered 2022-01-13: 1000 mL
  Filled 2022-01-13: qty 1000

## 2022-02-03 ENCOUNTER — Other Ambulatory Visit: Payer: Self-pay | Admitting: Gastroenterology

## 2022-02-18 ENCOUNTER — Other Ambulatory Visit (INDEPENDENT_AMBULATORY_CARE_PROVIDER_SITE_OTHER): Payer: Medicare Other

## 2022-02-18 ENCOUNTER — Encounter: Payer: Self-pay | Admitting: Gastroenterology

## 2022-02-18 ENCOUNTER — Ambulatory Visit (INDEPENDENT_AMBULATORY_CARE_PROVIDER_SITE_OTHER): Payer: Medicare Other | Admitting: Gastroenterology

## 2022-02-18 VITALS — BP 110/56 | HR 60 | Ht 64.5 in | Wt 203.5 lb

## 2022-02-18 DIAGNOSIS — R634 Abnormal weight loss: Secondary | ICD-10-CM

## 2022-02-18 DIAGNOSIS — D509 Iron deficiency anemia, unspecified: Secondary | ICD-10-CM

## 2022-02-18 DIAGNOSIS — R1032 Left lower quadrant pain: Secondary | ICD-10-CM

## 2022-02-18 DIAGNOSIS — R194 Change in bowel habit: Secondary | ICD-10-CM

## 2022-02-18 LAB — CBC WITH DIFFERENTIAL/PLATELET
Basophils Absolute: 0.1 K/uL (ref 0.0–0.1)
Basophils Relative: 1.1 % (ref 0.0–3.0)
Eosinophils Absolute: 0.2 K/uL (ref 0.0–0.7)
Eosinophils Relative: 2.5 % (ref 0.0–5.0)
HCT: 35.9 % — ABNORMAL LOW (ref 36.0–46.0)
Hemoglobin: 11.6 g/dL — ABNORMAL LOW (ref 12.0–15.0)
Lymphocytes Relative: 22.9 % (ref 12.0–46.0)
Lymphs Abs: 2.3 K/uL (ref 0.7–4.0)
MCHC: 32.2 g/dL (ref 30.0–36.0)
MCV: 84.9 fl (ref 78.0–100.0)
Monocytes Absolute: 0.7 K/uL (ref 0.1–1.0)
Monocytes Relative: 7.2 % (ref 3.0–12.0)
Neutro Abs: 6.5 K/uL (ref 1.4–7.7)
Neutrophils Relative %: 66.3 % (ref 43.0–77.0)
Platelets: 318 K/uL (ref 150.0–400.0)
RBC: 4.23 Mil/uL (ref 3.87–5.11)
RDW: 16.1 % — ABNORMAL HIGH (ref 11.5–15.5)
WBC: 9.8 K/uL (ref 4.0–10.5)

## 2022-02-18 LAB — IBC + FERRITIN
Ferritin: 12.1 ng/mL (ref 10.0–291.0)
Iron: 44 ug/dL (ref 42–145)
Saturation Ratios: 9.5 % — ABNORMAL LOW (ref 20.0–50.0)
TIBC: 464.8 ug/dL — ABNORMAL HIGH (ref 250.0–450.0)
Transferrin: 332 mg/dL (ref 212.0–360.0)

## 2022-02-18 MED ORDER — RIFAXIMIN 550 MG PO TABS: 550.0000 mg | ORAL_TABLET | Freq: Two times a day (BID) | ORAL | 0 refills | Status: DC

## 2022-02-18 MED ORDER — DICYCLOMINE HCL 20 MG PO TABS: 20.0000 mg | ORAL_TABLET | Freq: Three times a day (TID) | ORAL | 1 refills | Status: DC

## 2022-02-18 NOTE — Patient Instructions (Addendum)
It was my pleasure to provide care to you today. Based on our discussion, I am providing you with my recommendations below:  RECOMMENDATION(S):   I recommended a 2-week trial of antibiotics to try to treat your diarrhea and pain.  If the diarrhea persist despite this treatment we should plan a colonoscopy with biopsies to look for amyloid involving the colon.  Please keep taking your iron supplements.  You can take these every day as long as they are tolerated.  Please stop in the lab today to have your iron levels repeated.  I am referring you to a blood specialist to consider IV iron.  LABS:   Please proceed to the basement level for lab work before leaving today. Press "B" on the elevator. The lab is located at the first door on the left as you exit the elevator.  REFERRAL:  A referral, your demographics, a copy of your insurance card and your records will be sent to [facility]. You will receive a call from their office regarding the date, time and location of your appointment.   FOLLOW UP:  I would like for you to follow up with me in 4-5 months. Please call the office at [phone] to schedule your appointment.   BMI:  If you are age 77 or older, your body mass index should be between 23-30. Your Body mass index is 34.39 kg/m. If this is out of the aforementioned range listed, please consider follow up with your Primary Care Provider.  If you are age 77 or younger, your body mass index should be between 19-25. Your Body mass index is 34.39 kg/m. If this is out of the aformentioned range listed, please consider follow up with your Primary Care Provider.   MY CHART:  The [facility] providers would like to encourage you to use [facility] to communicate with providers for non-urgent requests or questions.  Due to long hold times on the telephone, sending your provider a message by [facility] may be a faster and more efficient way to get a response.  Please allow 48  business hours for a response.  Please remember that this is for non-urgent requests.   Thank you for trusting me with your gastrointestinal care!    Thornton Park, MD, MPH

## 2022-02-18 NOTE — Progress Notes (Signed)
Referring Provider: Reynold Bowen, MD Primary Care Physician:  Reynold Bowen, MD  Chief complaint:  abdominal pain and diarrhea   IMPRESSION:  Diarrhea with associated LLQ abdominal pain Unexplained weight loss - now appearing to stabilize    - she attributes weight loss to adaptation of high fiber diet and recent illness    - last EGD and colonoscopy 12/2020    - no source on CT abd/pelvis 5/23    - she is ultimately pleased with her weight loss and would ultimately like to lose more Iron deficiency anemia without overt bleeding Recent ICH due to cerebral amyloid angiopathy History of H pylori gastritis    -  follow-up testing documented resolution History of reflux on pantoprazole 40 mg BID    - esophageal biopsies negative for EOE History of colon polyps    - 2 tubular adenomas 2015    - 1 tubular adenoma 2022    - surveillance colonoscopy recommended in 7 years Diverticulosis without history of diverticulitis  Altered bowel habits and weight loss not explained by recent CT scan. Now with concurrent abdominal pain and iron deficiency that is concerning. Symptoms not explained by recent CT.  Could be explained by amyloid. Must also consider post-infectious IBS, IBD-D or even long Covid.    PLAN: - Continue high fiber diet - Continue pantoprazole 40 mg BID and Pepcid 20 mg BID - Continue dicyclomine taken 20 mg QID prn with instructions to take prior to meals - CBC, iron, ferritin today - Continue ferrous sulfate - resuming daily dosing - Referral to hematology for consideration of IV iron - Stool for fecal protectin, pancreatic elastase, and GI pathogen panel to evaluate her diarrhea - Empiric trial of Xifaxan 550 mg TID x 14 days, may substitute with doxycycline 100 mg BID x 14 days of Xifaxan is cost-prohibitive - Colonoscopy with [place] Red biopsies if no response to Xifaxan - Low threshold to consider repeat EGD +/- capsule endoscopy if colon biopsies are negative -  Follow-up in 4-5 months, earlier as needed  HPI: Pamela [name] is a 76 y.o. female who returns in follow-up for reflux and weight loss although she has also recently been having left-sided abdominal pain. She was last seen 09/11/21. The interval history is obtained through the patient and her husband, who contributes to the history.  I've also reviewed her recent records in the electronic health record.   She has type 2 diabetes mellitus, GERD, hypertension, hyperlipidemia, chronic anemia with baseline hemoglobin 10-12, H pylori gastritis with follow-up negative testing, Covid in November 2022 with prolonged symptoms, and  ICH 12/22 attributed to cerebral amyloid angiopathy. She is currently under evaluation for possible amyloid angiopathy. The hospitalusts were concerned for possible GI bleeding was hospitalized, although her hemoglobin was always stable. She was not aware of any blood in the stool or melena during the hospitalization.  She continues to have intermittent LLQ pain and now more frequent diarrhea.  She is having 2 to more than 5 bowel movements daily.  Diarrhea is often postprandial and occurs within minutes of eating. The urgency is new and she has had some accidents. At times, the diarrhea awakes her from sleep although she has not had nocturnal accidents. Abdominal pain improves with defecation. She thinks she may have a sensitive stomach. She is not getting out of the house as much as she would like due to diarrhea despite using dicyclomine 3-4 times daily.  Rare blood on the toilet paper after multiple BMs  in one day but otherwise there is no overt bleeding.  No reflux.   She has had difficulty remember to take her iron supplements every other day.   Labs 08/18/21 Hemoglobin 11, MCV 87.9, RDW 17.5, platelets 278 Labs 09/11/2021 hemoglobin 11.7, iron 51, ferritin 24.5  CT of the abdomen and pelvis with contrast 01/13/2022 showed colonic diverticulosis, a small hiatal hernia, a  moderate-sized umbilical hernia containing fat, and aortic atherosclerosis.   Endoscopic history: - Colonoscopy 2005: large IC valve but no polyps - Colonoscopy 2015: pancolonic diverticulosis and 2 tubular adenomas - Colonoscopy 12/16/20: diverticulosis, 66m descending colon tubular adenoma - EGD 12/16/20: irregular Z-line, biopsies confirming reflux and H. pylori gastritis. No EOE.  Past Medical History:  Diagnosis Date   Age-related nuclear cataract of right eye 04/15/2020   Amyloidosis ([place]    organ limited   Arthritis    Cataract    Colon polyps    Diabetes mellitus ([place]    Diverticulosis    Gallstones    GERD (gastroesophageal reflux disease)    Hearing loss    right ear   Heart murmur    Helicobacter pylori gastritis    Hiatal hernia    Hyperlipidemia    Hypertension    Hypothyroidism    Tachycardia    Tubular adenoma of colon    Vitreomacular adhesion of right eye 04/15/2020    Past Surgical History:  Procedure Laterality Date   ABDOMINAL HYSTERECTOMY     x 2   APPENDECTOMY     CHOLECYSTECTOMY     with appendectomy   COLONOSCOPY  12/16/2020   TONSILLECTOMY      Current Outpatient Medications  Medication Sig Dispense Refill   amLODipine (NORVASC) 5 MG tablet Take 1 tablet (5 mg total) by mouth daily. 90 tablet 3   aspirin 81 MG EC tablet 1 tablet     calcium carbonate (TUMS EX) 750 MG chewable tablet Chew 1 tablet by mouth as needed for heartburn.     carvedilol (COREG) 12.5 MG tablet Take 1 tablet (12.5 mg total) by mouth 2 (two) times daily with a meal. 180 tablet 3   cholecalciferol (VITAMIN D) 25 MCG (1000 UNIT) tablet 1,000 Units daily.     Coenzyme Q10 (CO Q 10) 100 MG CAPS Take 300 mg by mouth daily.     dicyclomine (BENTYL) 20 MG tablet TAKE 1 TABLET(20 MG) BY MOUTH FOUR TIMES DAILY BEFORE MEALS AND AT BEDTIME 120 tablet 2   empagliflozin (JARDIANCE) 10 MG TABS tablet Take 5 mg by mouth daily.     ezetimibe (ZETIA) 10 MG tablet TAKE 1 TABLET DAILY  90 tablet 3   famotidine (PEPCID) 20 MG tablet Take 20 mg by mouth daily as needed for heartburn or indigestion.     ferrous sulfate 324 (65 Fe) MG TBEC Take 1 tablet (325 mg total) by mouth every other day. 30 tablet    glimepiride (AMARYL) 2 MG tablet Take 2 mg by mouth daily before breakfast.     hydrochlorothiazide (HYDRODIURIL) 25 MG tablet Take 1 tablet (25 mg total) by mouth daily. 90 tablet 3   levothyroxine (SYNTHROID, LEVOTHROID) 137 MCG tablet Take 137 mcg by mouth daily before breakfast. One daily, 1 and 1/2 on Sunday     linaGLIPtin-metFORMIN HCl ER 2.01-999 MG TB24 Take 2 tablets by mouth every morning.      lisinopril (ZESTRIL) 40 MG tablet Take 1 tablet (40 mg total) by mouth daily. 90 tablet 3   metroNIDAZOLE (METROCREAM)  0.75 % cream Apply 1 application topically daily as needed (rosacea).     pantoprazole (PROTONIX) 40 MG tablet Take 1 tablet (40 mg total) by mouth 2 (two) times daily. 180 tablet 3   Polyvinyl Alcohol-Povidone (REFRESH OP) Apply 1 drop to eye as needed.     No current facility-administered medications for this visit.    Allergies as of 02/18/2022 - Review Complete 02/18/2022  Allergen Reaction Noted   Demerol [meperidine] Other (See Comments) 12/07/2012   Macrobid [nitrofurantoin macrocrystal] Other (See Comments) 12/07/2012   Macrobid [nitrofurantoin]  03/10/2021   Meclizine Other (See Comments) 12/07/2012   Sulfa antibiotics Other (See Comments) 12/07/2012    Family History  Problem Relation Age of Onset   Hypertension Mother        also cerebral hemorrhage   Cerebral aneurysm Mother        also cerebral hemorrhage   Hypertension Father    Diabetes Father        also Alzhemiers   Alzheimer's disease Father    Breast cancer Paternal Grandmother    CAD Maternal Grandfather        Hardening of the arteries   Cerebral aneurysm Maternal Grandfather    Hypertension Sister    Anxiety disorder Sister    Hypertension Son    Colon cancer Neg Hx     Esophageal cancer Neg Hx    Stomach cancer Neg Hx    Rectal cancer Neg Hx    Colon polyps Neg Hx     Physical Exam: Weight 211 pounds 04/20/21 Weight  212 pounds 06/22/10 Weight 204 pounds 09/11/21 Weight 204 pounds 02/18/22 Vital signs reviewed General:   Alert,  well-nourished, pleasant and cooperative in NAD Head:  Normocephalic and atraumatic. Eyes:  Sclera clear, no icterus.   Conjunctiva pink. Abdomen:  Soft, nontender, nondistended, normal bowel sounds, no rebound or guarding. No hepatosplenomegaly.   Neurologic:  Alert and  oriented x4;  grossly nonfocal Skin:  Intact without significant lesions or rashes. Psych:  Alert and cooperative. Normal mood and affect.  I spent over 40 minutes, including in depth chart review, independent review of results, communicating results with the patient directly, face-to-face time with the patient, coordinating care, ordering studies and medications as appropriate, and documentation.   Quetzally Callas L. Tarri Glenn, MD, MPH 02/18/2022, 1:43 PM

## 2022-02-19 ENCOUNTER — Telehealth: Payer: Self-pay | Admitting: Physician Assistant

## 2022-02-19 ENCOUNTER — Telehealth: Payer: Self-pay | Admitting: Gastroenterology

## 2022-02-19 MED ORDER — RIFAXIMIN 550 MG PO TABS: 550.0000 mg | ORAL_TABLET | Freq: Three times a day (TID) | ORAL | 0 refills | Status: DC

## 2022-02-19 NOTE — Telephone Encounter (Signed)
Patient called states [facility] doe snot have her Rifaximin medication she asked to get a call back once done so she knows.

## 2022-02-19 NOTE — Telephone Encounter (Signed)
Scheduled appt per 6/8 referral. Pt is aware of appt date and time. Pt is aware to arrive 15 mins prior to appt time and to bring and updated insurance card. Pt is aware of appt location.   

## 2022-02-19 NOTE — Telephone Encounter (Signed)
Resent script to patient's pharmacy.

## 2022-02-22 NOTE — Telephone Encounter (Signed)
Spoke with [facility] patient's copay is $38. Patient informed Xifaxan approved and of her copay.

## 2022-02-22 NOTE — Telephone Encounter (Signed)
Left message for patient to call office.  

## 2022-02-22 NOTE — Telephone Encounter (Signed)
Spoke with patient she had not heard from [place]. Spoke with [employer] requires prior authorization.   PRIOR AUTHORIZATION  PA initiation date: 02/22/22  Medication: Xifaxan 550 mg Insurance Company: [place] completed electronically through [employer] My Meds: Yes  Will await insurance response re: approval/denial.  APPROVAL  Medication: Xifaxan 550 mg Insurance Company: [employer] PA response: Approved Approval dates: 01/21/22-02/22/23 Misc. Notes: CaseId:[id number];Status:Approved;Review Type:Prior Auth;Coverage Start Date:01/23/2022;Coverage End Date:02/22/2023

## 2022-02-23 ENCOUNTER — Encounter: Payer: Self-pay | Admitting: Podiatry

## 2022-02-23 ENCOUNTER — Ambulatory Visit (INDEPENDENT_AMBULATORY_CARE_PROVIDER_SITE_OTHER): Payer: Medicare Other | Admitting: Podiatry

## 2022-02-23 DIAGNOSIS — M79675 Pain in left toe(s): Secondary | ICD-10-CM

## 2022-02-23 DIAGNOSIS — B351 Tinea unguium: Secondary | ICD-10-CM

## 2022-02-23 DIAGNOSIS — E119 Type 2 diabetes mellitus without complications: Secondary | ICD-10-CM | POA: Diagnosis not present

## 2022-02-23 DIAGNOSIS — M79674 Pain in right toe(s): Secondary | ICD-10-CM | POA: Diagnosis not present

## 2022-03-01 NOTE — Progress Notes (Signed)
  Subjective:  Patient ID: Pamela [name], female    DOB: 1946-05-03,  MRN: [id number]  Pamela [name] presents to clinic today for preventative diabetic foot care and painful thick toenails that are difficult to trim. Pain interferes with ambulation. Aggravating factors include wearing enclosed shoe gear. Pain is relieved with periodic professional debridement.  New problem(s): None.   PCP is Reynold Bowen, MD , and last visit was December 08, 2021.  Allergies  Allergen Reactions   Demerol [Meperidine] Other (See Comments)    Unspecified    Macrobid [Nitrofurantoin Macrocrystal] Other (See Comments)    Unspecified    Macrobid [Nitrofurantoin]     Other reaction(s): hearing loss in R ear   Meclizine Other (See Comments)    Unspecified    Sulfa Antibiotics Other (See Comments)    Unspecified     Review of Systems: Negative except as noted in the HPI.  Objective: No changes noted in today's physical examination. Constitutional Pamela [name] is a pleasant 76 y.o. Caucasian female, WD, WN in NAD. AAO x 3.   Vascular Capillary refill time to digits immediate b/l. Palpable DP pulse(s) b/l LE. Palpable PT pulse(s) b/l LE. No pain with calf compression b/l. No edema noted b/l LE. No cyanosis or clubbing noted b/l LE. Raised venous nodule noted dorsal aspect right hallux along EHL tendon with blue hue, nonpulsatile and asymptomatic.  Neurologic Normal speech. Oriented to person, place, and time. Protective sensation intact 5/5 intact bilaterally with 10g monofilament b/l.  Dermatologic No open wounds b/l LE. No interdigital macerations noted b/l LE. Toenails 1-5 bilaterally elongated, discolored, dystrophic, thickened, and crumbly with subungual debris and tenderness to dorsal palpation. No hyperkeratotic nor porokeratotic lesions present on today's visit.  Orthopedic: Muscle strength 5/5 to all lower extremity muscle groups bilaterally. No gross bony deformities bilaterally. HAV with bunion  deformity noted b/l LE. Pes planus deformity noted bilateral LE. Patient ambulates independent of any assistive aids.   Radiographs: None    Latest Ref Rng & Units 08/18/2021    2:34 AM  Hemoglobin A1C  Hemoglobin-A1c 4.8 - 5.6 % 6.7    Assessment/Plan: 1. Pain due to onychomycosis of toenails of both feet   2. Diabetes mellitus without complication ([place])     -Patient was evaluated and treated. All patient's and/or POA's questions/concerns answered on today's visit. -Continue foot and shoe inspections daily. Monitor blood glucose per PCP/Endocrinologist's recommendations. -Toenails 1-5 b/l were debrided in length and girth with sterile nail nippers and dremel without iatrogenic bleeding.  -Patient/POA to call should there be question/concern in the interim.   Return in about 9 weeks (around 04/27/2022).  Marzetta Board, DPM

## 2022-03-07 ENCOUNTER — Other Ambulatory Visit (HOSPITAL_COMMUNITY): Payer: Self-pay

## 2022-03-10 ENCOUNTER — Other Ambulatory Visit: Payer: Self-pay | Admitting: Physician Assistant

## 2022-03-10 DIAGNOSIS — D509 Iron deficiency anemia, unspecified: Secondary | ICD-10-CM

## 2022-03-10 NOTE — Progress Notes (Signed)
[place] [phone]   [phone]  CONSULT NOTE  REFERRING PHYSICIAN: Dr. Tarri Glenn   REASON FOR CONSULTATION: Iron deficiency anemia  HPI Pamela [name] is a 76 y.o. female past medical history significant for hypertension, possible cerebral amyloid angiopathy, GERD, H. pylori, hyperlipidemia, diabetes, and obesity is referred to clinic for iron deficiency anemia.  The patient recently had a follow-up visit with her gastroenterologist, Dr. Tarri Glenn, on 02/18/2022.  She was seeing her for the chief complaint of weight loss, diarrhea, and abdominal pain in the lower left quadrant.  Her last EGD and colonoscopy was on 12/16/2020.  The colonoscopy was normal except for multiple small and large mouth diverticula without diverticulitis, a 2 mm polyp in the descending colon, and hypertrophied papillae. The EGD performed on the same day showed congestion at the gastroesophageal junction and diffuse mildly erythematous mucosa without bleeding in the gastric body.  There is also a small hiatal hernia present. The gastric biopsy showed H. pylori gastritis.  More recently, for the patient's weight loss and abdominal pain, she had a CT of the abdomen and pelvis on 01/13/2022 which showed no acute findings.  There was colonic diverticulosis and a small hiatal hernia and moderate size umbilical hernia containing fat.  Dr. Tarri Glenn arranged for repeated some lab work, empiric trial of Xifaxan, and arrange repeat colonoscopy if no response to Xifaxan.   Dr. Tarri Glenn recently saw the patient, she repeated a CBC, iron, and ferritin.  This showed mild anemia with a hemoglobin of 11.6, normal MCV 84.9, slightly elevated RDW of 16.1, low end and normal iron at 44, ferritin on the low end and normal at 12.1, and low saturation at 9.5.  Her trans ferritin was within normal limits and her TIBC was slightly elevated 464.8.  She was referred to clinic for consideration of IV iron as the  patient is already taking an oral iron supplement daily.  From discussion with the patient today, she states that she has not been taking her iron supplement routinely.  She states that she takes it randomly.  She takes an over-the-counter ferrous sulfate and tolerates it well without any GI upset when she remembers to take it.  The oldest records I have are from 2010.  She started having mild intermittent anemia on and off since 2018. The lowest Hbg I see is 10.5.  She denies any prior history of anemia to her knowledge.  She has never required a blood transfusion before.  She denies any prior iron infusions.  Otherwise, she denies any other vitamin deficiencies to her knowledge except for vitamin D deficiency.   Overall, the patient reports some fatigue.  She also reports some intermittent lightheadedness without syncope.  She reports she does not exert herself enough to know she has dyspnea on exertion.  She denies any blood thinner use except for an 81 mg aspirin.  She denies any known abnormal bleeding or bruising.  Denies any fever, chills, or night sweats.  She is unable to quantify how much weight she has lost but she reports it was gradual as opposed to a sudden weight loss.  She denies any particular dietary habits such as being a vegan or vegetarian.  She denies any history of bariatric surgery.  Denies any known kidney disease.  The patient denies any family history of any cancers, bone marrow disorders, or blood disorders.  The patient states her mother had high blood pressure and stroke.  The patient's father had  high blood pressure and diabetes.  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  She is married and has 1 child.  She denies any smoking, drug, or history of alcohol use.    HPI  Past Medical History:  Diagnosis Date   Age-related nuclear cataract of right eye 04/15/2020   Amyloidosis ([place])    organ limited   Arthritis    Cataract    Colon polyps     Diabetes mellitus ([place])    Diverticulosis    Gallstones    GERD (gastroesophageal reflux disease)    Hearing loss    right ear   Heart murmur    Helicobacter pylori gastritis    Hiatal hernia    Hyperlipidemia    Hypertension    Hypothyroidism    Tachycardia    Tubular adenoma of colon    Vitreomacular adhesion of right eye 04/15/2020    Past Surgical History:  Procedure Laterality Date   ABDOMINAL HYSTERECTOMY     x 2   APPENDECTOMY     CHOLECYSTECTOMY     with appendectomy   COLONOSCOPY  12/16/2020   TONSILLECTOMY      Family History  Problem Relation Age of Onset   Hypertension Mother        also cerebral hemorrhage   Cerebral aneurysm Mother        also cerebral hemorrhage   Hypertension Father    Diabetes Father        also Alzhemiers   Alzheimer's disease Father    Breast cancer Paternal Grandmother    CAD Maternal Grandfather        Hardening of the arteries   Cerebral aneurysm Maternal Grandfather    Hypertension Sister    Anxiety disorder Sister    Hypertension Son    Colon cancer Neg Hx    Esophageal cancer Neg Hx    Stomach cancer Neg Hx    Rectal cancer Neg Hx    Colon polyps Neg Hx     Social History Social History   Tobacco Use   Smoking status: Never   Smokeless tobacco: Never  Vaping Use   Vaping Use: Never used  Substance Use Topics   Alcohol use: No   Drug use: No    Allergies  Allergen Reactions   Demerol [Meperidine] Other (See Comments)    Unspecified    Macrobid [Nitrofurantoin Macrocrystal] Other (See Comments)    Unspecified    Macrobid [Nitrofurantoin]     Other reaction(s): hearing loss in R ear   Meclizine Other (See Comments)    Unspecified    Sulfa Antibiotics Other (See Comments)    Unspecified     Current Outpatient Medications  Medication Sig Dispense Refill   amLODipine (NORVASC) 5 MG tablet Take 1 tablet (5 mg total) by mouth daily. 90 tablet 3   aspirin 81 MG EC tablet 1 tablet     calcium  carbonate (TUMS EX) 750 MG chewable tablet Chew 1 tablet by mouth as needed for heartburn.     carvedilol (COREG) 12.5 MG tablet Take 1 tablet (12.5 mg total) by mouth 2 (two) times daily with a meal. 180 tablet 3   cholecalciferol (VITAMIN D) 25 MCG (1000 UNIT) tablet 1,000 Units daily.     Coenzyme Q10 (CO Q 10) 100 MG CAPS Take 300 mg by mouth daily.     dicyclomine (BENTYL) 20 MG tablet Take 1 tablet (20 mg total) by mouth 4 (four) times daily -  before meals and at bedtime. 360 tablet 1   empagliflozin (JARDIANCE) 10 MG TABS tablet Take 5 mg by mouth daily.     ezetimibe (ZETIA) 10 MG tablet TAKE 1 TABLET DAILY 90 tablet 3   famotidine (PEPCID) 20 MG tablet Take 20 mg by mouth daily as needed for heartburn or indigestion.     ferrous sulfate 324 (65 Fe) MG TBEC Take 1 tablet (325 mg total) by mouth every other day. 30 tablet    glimepiride (AMARYL) 2 MG tablet Take 2 mg by mouth daily before breakfast.     hydrochlorothiazide (HYDRODIURIL) 25 MG tablet Take 1 tablet (25 mg total) by mouth daily. 90 tablet 3   levothyroxine (SYNTHROID, LEVOTHROID) 137 MCG tablet Take 137 mcg by mouth daily before breakfast. One daily, 1 and 1/2 on Sunday     linaGLIPtin-metFORMIN HCl ER 2.01-999 MG TB24 Take 2 tablets by mouth every morning.      lisinopril (ZESTRIL) 40 MG tablet Take 1 tablet (40 mg total) by mouth daily. 90 tablet 3   metroNIDAZOLE (METROCREAM) 0.75 % cream Apply 1 application topically daily as needed (rosacea).     pantoprazole (PROTONIX) 40 MG tablet Take 1 tablet (40 mg total) by mouth 2 (two) times daily. 180 tablet 3   Polyvinyl Alcohol-Povidone (REFRESH OP) Apply 1 drop to eye as needed.     rifaximin (XIFAXAN) 550 MG TABS tablet Take 1 tablet (550 mg total) by mouth 3 (three) times daily. 42 tablet 0   No current facility-administered medications for this visit.    REVIEW OF SYSTEMS:   Review of Systems  Constitutional: Positive for fatigue.  Positive for gradual weight loss.   Negative for appetite change, chills and fever.   HENT: Negative for mouth sores, nosebleeds, sore throat and trouble swallowing.   Eyes: Negative for eye problems and icterus.  Respiratory: Negative for cough, hemoptysis, shortness of breath and wheezing.   Cardiovascular: Negative for chest pain and leg swelling.  Gastrointestinal: Positive for occasional lower left quadrant pain and diarrhea.  Negative for constipation, nausea and vomiting.  Genitourinary: Negative for bladder incontinence, difficulty urinating, dysuria, frequency and hematuria.   Musculoskeletal: Negative for back pain, gait problem, neck pain and neck stiffness.  Skin: Negative for itching and rash.  Neurological: Negative for dizziness, extremity weakness, gait problem, headaches, light-headedness and seizures.  Hematological: Negative for adenopathy. Does not bruise/bleed easily.  Psychiatric/Behavioral: Negative for confusion, depression and sleep disturbance. The patient is not nervous/anxious.     PHYSICAL EXAMINATION:  Blood pressure 133/66, pulse 71, temperature 97.6 F (36.4 C), temperature source Temporal, resp. rate 17, height 5' 4.5" (1.638 m), weight 202 lb 4.8 oz (91.8 kg), SpO2 100 %.  ECOG PERFORMANCE STATUS: 1  Physical Exam  Constitutional: Oriented to person, place, and time and well-developed, well-nourished, and in no distress.  HENT:  Head: Normocephalic and atraumatic.  Mouth/Throat: Oropharynx is clear and moist. No oropharyngeal exudate.  Eyes: Conjunctivae are normal. Right eye exhibits no discharge. Left eye exhibits no discharge. No scleral icterus.  Neck: Normal range of motion. Neck supple.  Cardiovascular: Normal rate, regular rhythm, normal heart sounds and intact distal pulses.   Pulmonary/Chest: Effort normal and breath sounds normal. No respiratory distress. No wheezes. No rales.  Abdominal: Soft. Bowel sounds are normal. Exhibits no distension and no mass. There is no tenderness.   Musculoskeletal: Normal range of motion. Exhibits no edema.  Lymphadenopathy:    No cervical adenopathy.  Neurological: Alert and oriented  to person, place, and time. Exhibits normal muscle tone.  Ambulates with a cane. Skin: Skin is warm and dry. No rash noted. Not diaphoretic. No erythema. No pallor.  Psychiatric: Mood, memory and judgment normal.  Vitals reviewed.  LABORATORY DATA: Lab Results  Component Value Date   WBC 8.9 03/11/2022   HGB 11.1 (L) 03/11/2022   HCT 34.8 (L) 03/11/2022   MCV 85.7 03/11/2022   PLT 310 03/11/2022      Chemistry      Component Value Date/Time   NA 140 03/11/2022 1315   NA 137 03/01/2017 1024   K 4.2 03/11/2022 1315   CL 106 03/11/2022 1315   CO2 27 03/11/2022 1315   BUN 19 03/11/2022 1315   BUN 17 03/01/2017 1024   CREATININE 1.02 (H) 03/11/2022 1315   CREATININE 0.69 09/26/2014 1607      Component Value Date/Time   CALCIUM 9.9 03/11/2022 1315   ALKPHOS 69 03/11/2022 1315   AST 14 (L) 03/11/2022 1315   ALT 10 03/11/2022 1315   BILITOT 0.3 03/11/2022 1315       RADIOGRAPHIC STUDIES: No results found.  ASSESSMENT: This is a very pleasant 76 year old Caucasian female referred to clinic for iron deficiency anemia.  The patient was seen with Dr. Julien Nordmann today.  The patient had several lab studies performed including a CBC, CMP, iron studies, ferritin, W29, folic acid, and SPEP with immunofixation.  The labs demonstrate mild normocytic anemia with a hemoglobin of 11.1 and MCV within normal limits at 85.7.  Her RDW is slightly elevated.  The patient's white blood cell count and platelet count is within normal limits.  Her CMP is fairly unremarkable.  Her iron is on the low end of normal at 45, saturation of 11%, TIBC 396.  Her ferritin is still pending.  Her vitamin B12 came back low at 135.  Dr. Julien Nordmann gave the patient the option of taking her over-the-counter iron supplement more routinely daily or every other day versus boosting up  her iron with IV iron.  If the patient decides to proceed with IV iron, we would still highly encourage her to take her over-the-counter iron supplement every day or every other day and to take with vitamin C to help maintain her iron levels from drifting downwards.  The patient decided to proceed with IV iron.  We will arrange for her to receive Venofer 300 mg weekly x3 starting next week.  Because her vitamin B12 levels are low, I will also arrange for her to receive B12 injections weekly x4 followed by monthly thereon after.   I initially discussed with the patient that I would arrange for IV iron at the [facility] infusion center; however, since her B12 is also low, I will arrange for her to receive IV iron and her B12 injections at the same day at the cancer center.  I have called her and left a message with this information and encouraged her to call us back to confirm that she received this message and to ensure she has no questions.  We will see the patient back for follow-up visit in 3 months for evaluation and repeat CBC, iron studies, b12 and ferritin.   The patient was encouraged to increase her dietary intake of iron rich food.  She was also encouraged to continue taking her iron supplement.  She was encouraged to take this with vitamin C as that helps with iron absorption.  She will continue to follow closely with Dr. Tarri Glenn  regarding her abdominal pain and changes in bowel habits.  The patient voices understanding of current disease status and treatment options and is in agreement with the current care plan.  All questions were answered. The patient knows to call the clinic with any problems, questions or concerns. We can certainly see the patient much sooner if necessary.  Thank you so much for allowing me to participate in the care of Pamela [name]. I will continue to follow up the patient with you and assist in her care.   Disclaimer: This note was dictated with voice  recognition software. Similar sounding words can inadvertently be transcribed and may not be corrected upon review.   Taeko Schaffer L Demontae Antunes March 11, 2022, 3:20 PM  ADDENDUM: Hematology/Oncology Attending: I had a face-to-face encounter with the patient today.  I reviewed her records, lab and recommended her care plan.  This is a very pleasant 76 years old white female with history of hypertension, GERD, cerebral amyloid angiopathy, H. pylori, dyslipidemia, diabetes mellitus who was seen by gastroenterology for evaluation of her persistent anemia, weight loss as well as abdominal pain and diarrhea.  The patient underwent EGD and colonoscopy in April 2022.  Her colonoscopy was normal except for multiple small and large mouth diverticula without diverticulitis in addition to 0.2 cm polyp in the descending colon and the EGD showed congestion at the gastroesophageal junction with diffuse mildly erythematous mucosa without bleeding in the gastric body.  There was also small hiatal hernia and the gastric biopsy was consistent with H. pylori gastritis. The patient was seen recently by her gastroenterologist and was noted to have persistent anemia.  She was supposed to start oral iron tablet but she did not do it because she was here to consider IV iron infusion. When seen today repeat CBC showed hemoglobin of 11.1 and hematocrit 34.8% with normal total white blood count as well as platelet count.  Her ferritin level was low normal at 14.  She also has low normal serum iron with iron saturation of 11%.  The patient was also noted to have low vitamin B12 level of 135 and normal serum folate. I discussed with the patient her treatment options including treatment with the oral iron tablet and vitamin B12 versus consideration of the iron infusion in addition to vitamin B12 injection.  She is interested in proceeding with the iron infusion and she will be treated with Venofer 300 Mg IV weekly for 3 weeks in  addition to vitamin B12 injection. We will see the patient back for follow-up visit in 3 months for evaluation and repeat blood work. She was advised to call immediately if she has any other concerning symptoms in the interval.  The total time spent in the appointment was 60 minutes. Disclaimer: This note was dictated with voice recognition software. Similar sounding words can inadvertently be transcribed and may be missed upon review. Eilleen Kempf, MD

## 2022-03-11 ENCOUNTER — Inpatient Hospital Stay: Payer: Medicare Other

## 2022-03-11 ENCOUNTER — Inpatient Hospital Stay: Payer: Medicare Other | Attending: Physician Assistant | Admitting: Physician Assistant

## 2022-03-11 ENCOUNTER — Other Ambulatory Visit: Payer: Self-pay

## 2022-03-11 VITALS — BP 133/66 | HR 71 | Temp 97.6°F | Resp 17 | Ht 64.5 in | Wt 202.3 lb

## 2022-03-11 DIAGNOSIS — Z885 Allergy status to narcotic agent status: Secondary | ICD-10-CM | POA: Diagnosis not present

## 2022-03-11 DIAGNOSIS — Z881 Allergy status to other antibiotic agents status: Secondary | ICD-10-CM | POA: Insufficient documentation

## 2022-03-11 DIAGNOSIS — I10 Essential (primary) hypertension: Secondary | ICD-10-CM | POA: Insufficient documentation

## 2022-03-11 DIAGNOSIS — K219 Gastro-esophageal reflux disease without esophagitis: Secondary | ICD-10-CM | POA: Diagnosis not present

## 2022-03-11 DIAGNOSIS — E559 Vitamin D deficiency, unspecified: Secondary | ICD-10-CM | POA: Insufficient documentation

## 2022-03-11 DIAGNOSIS — Z803 Family history of malignant neoplasm of breast: Secondary | ICD-10-CM | POA: Diagnosis not present

## 2022-03-11 DIAGNOSIS — Z833 Family history of diabetes mellitus: Secondary | ICD-10-CM

## 2022-03-11 DIAGNOSIS — D509 Iron deficiency anemia, unspecified: Secondary | ICD-10-CM

## 2022-03-11 DIAGNOSIS — R1032 Left lower quadrant pain: Secondary | ICD-10-CM | POA: Diagnosis not present

## 2022-03-11 DIAGNOSIS — Z823 Family history of stroke: Secondary | ICD-10-CM

## 2022-03-11 DIAGNOSIS — E119 Type 2 diabetes mellitus without complications: Secondary | ICD-10-CM | POA: Diagnosis not present

## 2022-03-11 DIAGNOSIS — K449 Diaphragmatic hernia without obstruction or gangrene: Secondary | ICD-10-CM | POA: Diagnosis not present

## 2022-03-11 DIAGNOSIS — Z8249 Family history of ischemic heart disease and other diseases of the circulatory system: Secondary | ICD-10-CM | POA: Diagnosis not present

## 2022-03-11 DIAGNOSIS — E785 Hyperlipidemia, unspecified: Secondary | ICD-10-CM | POA: Insufficient documentation

## 2022-03-11 DIAGNOSIS — Z8719 Personal history of other diseases of the digestive system: Secondary | ICD-10-CM | POA: Diagnosis not present

## 2022-03-11 DIAGNOSIS — E538 Deficiency of other specified B group vitamins: Secondary | ICD-10-CM | POA: Diagnosis not present

## 2022-03-11 DIAGNOSIS — R42 Dizziness and giddiness: Secondary | ICD-10-CM | POA: Insufficient documentation

## 2022-03-11 DIAGNOSIS — K429 Umbilical hernia without obstruction or gangrene: Secondary | ICD-10-CM | POA: Insufficient documentation

## 2022-03-11 DIAGNOSIS — R634 Abnormal weight loss: Secondary | ICD-10-CM | POA: Diagnosis not present

## 2022-03-11 DIAGNOSIS — Z86010 Personal history of colonic polyps: Secondary | ICD-10-CM | POA: Diagnosis not present

## 2022-03-11 DIAGNOSIS — Z79899 Other long term (current) drug therapy: Secondary | ICD-10-CM | POA: Insufficient documentation

## 2022-03-11 DIAGNOSIS — Z79890 Hormone replacement therapy (postmenopausal): Secondary | ICD-10-CM | POA: Diagnosis not present

## 2022-03-11 DIAGNOSIS — Z818 Family history of other mental and behavioral disorders: Secondary | ICD-10-CM | POA: Diagnosis not present

## 2022-03-11 DIAGNOSIS — Z882 Allergy status to sulfonamides status: Secondary | ICD-10-CM | POA: Insufficient documentation

## 2022-03-11 DIAGNOSIS — Z9049 Acquired absence of other specified parts of digestive tract: Secondary | ICD-10-CM | POA: Insufficient documentation

## 2022-03-11 DIAGNOSIS — R197 Diarrhea, unspecified: Secondary | ICD-10-CM | POA: Diagnosis not present

## 2022-03-11 DIAGNOSIS — E669 Obesity, unspecified: Secondary | ICD-10-CM | POA: Insufficient documentation

## 2022-03-11 DIAGNOSIS — R5383 Other fatigue: Secondary | ICD-10-CM | POA: Insufficient documentation

## 2022-03-11 LAB — VITAMIN B12: Vitamin B-12: 135 pg/mL — ABNORMAL LOW (ref 180–914)

## 2022-03-11 LAB — CMP (CANCER CENTER ONLY)
ALT: 10 U/L (ref 0–44)
AST: 14 U/L — ABNORMAL LOW (ref 15–41)
Albumin: 4 g/dL (ref 3.5–5.0)
Alkaline Phosphatase: 69 U/L (ref 38–126)
Anion gap: 7 (ref 5–15)
BUN: 19 mg/dL (ref 8–23)
CO2: 27 mmol/L (ref 22–32)
Calcium: 9.9 mg/dL (ref 8.9–10.3)
Chloride: 106 mmol/L (ref 98–111)
Creatinine: 1.02 mg/dL — ABNORMAL HIGH (ref 0.44–1.00)
GFR, Estimated: 57 mL/min — ABNORMAL LOW (ref >60)
Glucose, Bld: 152 mg/dL — ABNORMAL HIGH (ref 70–99)
Potassium: 4.2 mmol/L (ref 3.5–5.1)
Sodium: 140 mmol/L (ref 135–145)
Total Bilirubin: 0.3 mg/dL (ref 0.3–1.2)
Total Protein: 7 g/dL (ref 6.5–8.1)

## 2022-03-11 LAB — CBC WITH DIFFERENTIAL (CANCER CENTER ONLY)
Abs Immature Granulocytes: 0.03 10*3/uL (ref 0.00–0.07)
Basophils Absolute: 0.1 10*3/uL (ref 0.0–0.1)
Basophils Relative: 1 %
Eosinophils Absolute: 0.3 10*3/uL (ref 0.0–0.5)
Eosinophils Relative: 3 %
HCT: 34.8 % — ABNORMAL LOW (ref 36.0–46.0)
Hemoglobin: 11.1 g/dL — ABNORMAL LOW (ref 12.0–15.0)
Immature Granulocytes: 0 %
Lymphocytes Relative: 25 %
Lymphs Abs: 2.2 10*3/uL (ref 0.7–4.0)
MCH: 27.3 pg (ref 26.0–34.0)
MCHC: 31.9 g/dL (ref 30.0–36.0)
MCV: 85.7 fL (ref 80.0–100.0)
Monocytes Absolute: 0.7 10*3/uL (ref 0.1–1.0)
Monocytes Relative: 7 %
Neutro Abs: 5.6 10*3/uL (ref 1.7–7.7)
Neutrophils Relative %: 64 %
Platelet Count: 310 10*3/uL (ref 150–400)
RBC: 4.06 MIL/uL (ref 3.87–5.11)
RDW: 16 % — ABNORMAL HIGH (ref 11.5–15.5)
WBC Count: 8.9 10*3/uL (ref 4.0–10.5)
nRBC: 0 % (ref 0.0–0.2)

## 2022-03-11 LAB — FERRITIN: Ferritin: 14 ng/mL (ref 11–307)

## 2022-03-11 LAB — IRON AND IRON BINDING CAPACITY (CC-WL,HP ONLY)
Iron: 45 ug/dL (ref 28–170)
Saturation Ratios: 11 % (ref 10.4–31.8)
TIBC: 396 ug/dL (ref 250–450)
UIBC: 351 ug/dL (ref 148–442)

## 2022-03-11 LAB — FOLATE: Folate: 8.4 ng/mL (ref >5.9)

## 2022-03-12 ENCOUNTER — Telehealth: Payer: Self-pay

## 2022-03-12 ENCOUNTER — Telehealth: Payer: Self-pay | Admitting: Oncology

## 2022-03-12 ENCOUNTER — Encounter: Payer: Self-pay | Admitting: Physician Assistant

## 2022-03-12 NOTE — Telephone Encounter (Signed)
Scheduled per 06/29 los, patient has been called and notified of upcoming appointments.

## 2022-03-12 NOTE — Telephone Encounter (Signed)
Patient called to ask about her appointments and to get them scheduled for a day that will work best for her. I informed her that I will message scheduling and they will reach out to her to get those rescheduled. Patient had no further questions or concerns at this time.

## 2022-03-16 LAB — PROTEIN ELECTROPHORESIS, SERUM, WITH REFLEX
A/G Ratio: 1.1 (ref 0.7–1.7)
Albumin ELP: 3.5 g/dL (ref 2.9–4.4)
Alpha-1-Globulin: 0.2 g/dL (ref 0.0–0.4)
Alpha-2-Globulin: 0.8 g/dL (ref 0.4–1.0)
Beta Globulin: 1.1 g/dL (ref 0.7–1.3)
Gamma Globulin: 1 g/dL (ref 0.4–1.8)
Globulin, Total: 3.1 g/dL (ref 2.2–3.9)
Total Protein ELP: 6.6 g/dL (ref 6.0–8.5)

## 2022-03-17 ENCOUNTER — Telehealth: Payer: Self-pay | Admitting: Physician Assistant

## 2022-03-17 NOTE — Telephone Encounter (Signed)
I attempted to call the patient to review her lab work.  The patient asked when I saw her at her consultation last week to call her when I have the results of her SPEP.  I was unable to reach the patient and left a voicemail letting her know that her SPEP was normal.  If she would like a copy of her lab work she can ask the nurse in the infusion room to print it off for her tomorrow when she comes in for her B12 injection and iron infusion.  She has any further questions she could call us back or let us know at her appointments tomorrow.

## 2022-03-18 ENCOUNTER — Inpatient Hospital Stay: Payer: Medicare Other | Attending: Physician Assistant

## 2022-03-18 ENCOUNTER — Ambulatory Visit: Payer: Medicare Other

## 2022-03-18 ENCOUNTER — Other Ambulatory Visit: Payer: Self-pay

## 2022-03-18 VITALS — BP 126/64 | HR 64 | Temp 98.3°F | Resp 17

## 2022-03-18 DIAGNOSIS — Z79899 Other long term (current) drug therapy: Secondary | ICD-10-CM | POA: Diagnosis not present

## 2022-03-18 DIAGNOSIS — D509 Iron deficiency anemia, unspecified: Secondary | ICD-10-CM

## 2022-03-18 DIAGNOSIS — E538 Deficiency of other specified B group vitamins: Secondary | ICD-10-CM | POA: Insufficient documentation

## 2022-03-18 MED ORDER — CYANOCOBALAMIN 1000 MCG/ML IJ SOLN
1000.0000 ug | Freq: Once | INTRAMUSCULAR | Status: AC
  Administered 2022-03-18: 1000 ug via INTRAMUSCULAR
  Filled 2022-03-18: qty 1

## 2022-03-18 MED ORDER — SODIUM CHLORIDE 0.9 % IV SOLN
300.0000 mg | Freq: Once | INTRAVENOUS | Status: AC
  Administered 2022-03-18: 300 mg via INTRAVENOUS
  Filled 2022-03-18: qty 300

## 2022-03-18 MED ORDER — ACETAMINOPHEN 325 MG PO TABS
650.0000 mg | ORAL_TABLET | Freq: Once | ORAL | Status: AC
  Administered 2022-03-18: 650 mg via ORAL
  Filled 2022-03-18: qty 2

## 2022-03-18 MED ORDER — DIPHENHYDRAMINE HCL 25 MG PO CAPS
25.0000 mg | ORAL_CAPSULE | Freq: Once | ORAL | Status: AC
  Administered 2022-03-18: 25 mg via ORAL
  Filled 2022-03-18: qty 1

## 2022-03-18 MED ORDER — SODIUM CHLORIDE 0.9 % IV SOLN: Freq: Once | INTRAVENOUS | Status: AC

## 2022-03-18 NOTE — Patient Instructions (Signed)
Iron Sucrose Injection What is this medication? IRON SUCROSE (EYE ern SOO krose) treats low levels of iron (iron deficiency anemia) in people with kidney disease. Iron is a mineral that plays an important role in making red blood cells, which carry oxygen from your lungs to the rest of your body. This medicine may be used for other purposes; ask your health care provider or pharmacist if you have questions. COMMON BRAND NAME(S): Venofer What should I tell my care team before I take this medication? They need to know if you have any of these conditions: Anemia not caused by low iron levels Heart disease High levels of iron in the blood Kidney disease Liver disease An unusual or allergic reaction to iron, other medications, foods, dyes, or preservatives Pregnant or trying to get pregnant Breast-feeding How should I use this medication? This medication is for infusion into a vein. It is given in a hospital or clinic setting. Talk to your care team about the use of this medication in children. While this medication may be prescribed for children as young as 2 years for selected conditions, precautions do apply. Overdosage: If you think you have taken too much of this medicine contact a poison control center or emergency room at once. NOTE: This medicine is only for you. Do not share this medicine with others. What if I miss a dose? It is important not to miss your dose. Call your care team if you are unable to keep an appointment. What may interact with this medication? Do not take this medication with any of the following: Deferoxamine Dimercaprol Other iron products This medication may also interact with the following: Chloramphenicol Deferasirox This list may not describe all possible interactions. Give your health care provider a list of all the medicines, herbs, non-prescription drugs, or dietary supplements you use. Also tell them if you smoke, drink alcohol, or use illegal drugs.  Some items may interact with your medicine. What should I watch for while using this medication? Visit your care team regularly. Tell your care team if your symptoms do not start to get better or if they get worse. You may need blood work done while you are taking this medication. You may need to follow a special diet. Talk to your care team. Foods that contain iron include: whole grains/cereals, dried fruits, beans, or peas, leafy green vegetables, and organ meats (liver, kidney). What side effects may I notice from receiving this medication? Side effects that you should report to your care team as soon as possible: Allergic reactions--skin rash, itching, hives, swelling of the face, lips, tongue, or throat Low blood pressure--dizziness, feeling faint or lightheaded, blurry vision Shortness of breath Side effects that usually do not require medical attention (report to your care team if they continue or are bothersome): Flushing Headache Joint pain Muscle pain Nausea Pain, redness, or irritation at injection site This list may not describe all possible side effects. Call your doctor for medical advice about side effects. You may report side effects to FDA at 1-800-FDA-1088. Where should I keep my medication? This medication is given in a hospital or clinic and will not be stored at home. NOTE: This sheet is a summary. It may not cover all possible information. If you have questions about this medicine, talk to your doctor, pharmacist, or health care provider.  2023 Elsevier/Gold Standard (2021-01-23 00:00:00)   Vitamin B12 Injection What is this medication? Vitamin B12 (VAHY tuh min B12) prevents and treats low vitamin B12 levels in  your body. It is used in people who do not get enough vitamin B12 from their diet or when their digestive tract does not absorb enough. Vitamin B12 plays an important role in maintaining the health of your nervous system and red blood cells. This medicine may  be used for other purposes; ask your health care provider or pharmacist if you have questions. COMMON BRAND NAME(S): B-12 Compliance Kit, B-12 Injection Kit, Cyomin, Dodex, LA-12, Nutri-Twelve, Physicians EZ Use B-12, Primabalt What should I tell my care team before I take this medication? They need to know if you have any of these conditions: Kidney disease Leber's disease Megaloblastic anemia An unusual or allergic reaction to cyanocobalamin, cobalt, other medications, foods, dyes, or preservatives Pregnant or trying to get pregnant Breast-feeding How should I use this medication? This medication is injected into a muscle or deeply under the skin. It is usually given in a clinic or care team's office. However, your care team may teach you how to inject yourself. Follow all instructions. Talk to your care team about the use of this medication in children. Special care may be needed. Overdosage: If you think you have taken too much of this medicine contact a poison control center or emergency room at once. NOTE: This medicine is only for you. Do not share this medicine with others. What if I miss a dose? If you are given your dose at a clinic or care team's office, call to reschedule your appointment. If you give your own injections, and you miss a dose, take it as soon as you can. If it is almost time for your next dose, take only that dose. Do not take double or extra doses. What may interact with this medication? Alcohol Colchicine This list may not describe all possible interactions. Give your health care provider a list of all the medicines, herbs, non-prescription drugs, or dietary supplements you use. Also tell them if you smoke, drink alcohol, or use illegal drugs. Some items may interact with your medicine. What should I watch for while using this medication? Visit your care team regularly. You may need blood work done while you are taking this medication. You may need to follow a  special diet. Talk to your care team. Limit your alcohol intake and avoid smoking to get the best benefit. What side effects may I notice from receiving this medication? Side effects that you should report to your care team as soon as possible: Allergic reactions--skin rash, itching, hives, swelling of the face, lips, tongue, or throat Swelling of the ankles, hands, or feet Trouble breathing Side effects that usually do not require medical attention (report to your care team if they continue or are bothersome): Diarrhea This list may not describe all possible side effects. Call your doctor for medical advice about side effects. You may report side effects to FDA at 1-800-FDA-1088. Where should I keep my medication? Keep out of the reach of children. Store at room temperature between 15 and 30 degrees C (59 and 85 degrees F). Protect from light. Throw away any unused medication after the expiration date. NOTE: This sheet is a summary. It may not cover all possible information. If you have questions about this medicine, talk to your doctor, pharmacist, or health care provider.  2023 Elsevier/Gold Standard (2021-05-12 00:00:00)

## 2022-03-25 ENCOUNTER — Inpatient Hospital Stay: Payer: Medicare Other

## 2022-03-25 ENCOUNTER — Other Ambulatory Visit: Payer: Self-pay

## 2022-03-25 VITALS — BP 125/54 | HR 66 | Resp 18

## 2022-03-25 DIAGNOSIS — D509 Iron deficiency anemia, unspecified: Secondary | ICD-10-CM

## 2022-03-25 DIAGNOSIS — Z79899 Other long term (current) drug therapy: Secondary | ICD-10-CM | POA: Diagnosis not present

## 2022-03-25 DIAGNOSIS — E538 Deficiency of other specified B group vitamins: Secondary | ICD-10-CM | POA: Diagnosis not present

## 2022-03-25 MED ORDER — SODIUM CHLORIDE 0.9 % IV SOLN: Freq: Once | INTRAVENOUS | Status: AC

## 2022-03-25 MED ORDER — ACETAMINOPHEN 325 MG PO TABS
650.0000 mg | ORAL_TABLET | Freq: Once | ORAL | Status: AC
  Administered 2022-03-25: 650 mg via ORAL
  Filled 2022-03-25: qty 2

## 2022-03-25 MED ORDER — DIPHENHYDRAMINE HCL 25 MG PO CAPS
25.0000 mg | ORAL_CAPSULE | Freq: Once | ORAL | Status: AC
  Administered 2022-03-25: 25 mg via ORAL
  Filled 2022-03-25: qty 1

## 2022-03-25 MED ORDER — SODIUM CHLORIDE 0.9 % IV SOLN
300.0000 mg | Freq: Once | INTRAVENOUS | Status: AC
  Administered 2022-03-25: 300 mg via INTRAVENOUS
  Filled 2022-03-25: qty 300

## 2022-03-25 MED ORDER — CYANOCOBALAMIN 1000 MCG/ML IJ SOLN
1000.0000 ug | Freq: Once | INTRAMUSCULAR | Status: AC
  Administered 2022-03-25: 1000 ug via INTRAMUSCULAR
  Filled 2022-03-25: qty 1

## 2022-03-25 NOTE — Patient Instructions (Signed)
Iron Sucrose Injection What is this medication? IRON SUCROSE (EYE ern SOO krose) treats low levels of iron (iron deficiency anemia) in people with kidney disease. Iron is a mineral that plays an important role in making red blood cells, which carry oxygen from your lungs to the rest of your body. This medicine may be used for other purposes; ask your health care provider or pharmacist if you have questions. COMMON BRAND NAME(S): Venofer What should I tell my care team before I take this medication? They need to know if you have any of these conditions: Anemia not caused by low iron levels Heart disease High levels of iron in the blood Kidney disease Liver disease An unusual or allergic reaction to iron, other medications, foods, dyes, or preservatives Pregnant or trying to get pregnant Breast-feeding How should I use this medication? This medication is for infusion into a vein. It is given in a hospital or clinic setting. Talk to your care team about the use of this medication in children. While this medication may be prescribed for children as young as 2 years for selected conditions, precautions do apply. Overdosage: If you think you have taken too much of this medicine contact a poison control center or emergency room at once. NOTE: This medicine is only for you. Do not share this medicine with others. What if I miss a dose? It is important not to miss your dose. Call your care team if you are unable to keep an appointment. What may interact with this medication? Do not take this medication with any of the following: Deferoxamine Dimercaprol Other iron products This medication may also interact with the following: Chloramphenicol Deferasirox This list may not describe all possible interactions. Give your health care provider a list of all the medicines, herbs, non-prescription drugs, or dietary supplements you use. Also tell them if you smoke, drink alcohol, or use illegal drugs.  Some items may interact with your medicine. What should I watch for while using this medication? Visit your care team regularly. Tell your care team if your symptoms do not start to get better or if they get worse. You may need blood work done while you are taking this medication. You may need to follow a special diet. Talk to your care team. Foods that contain iron include: whole grains/cereals, dried fruits, beans, or peas, leafy green vegetables, and organ meats (liver, kidney). What side effects may I notice from receiving this medication? Side effects that you should report to your care team as soon as possible: Allergic reactions--skin rash, itching, hives, swelling of the face, lips, tongue, or throat Low blood pressure--dizziness, feeling faint or lightheaded, blurry vision Shortness of breath Side effects that usually do not require medical attention (report to your care team if they continue or are bothersome): Flushing Headache Joint pain Muscle pain Nausea Pain, redness, or irritation at injection site This list may not describe all possible side effects. Call your doctor for medical advice about side effects. You may report side effects to FDA at 1-800-FDA-1088. Where should I keep my medication? This medication is given in a hospital or clinic and will not be stored at home. NOTE: This sheet is a summary. It may not cover all possible information. If you have questions about this medicine, talk to your doctor, pharmacist, or health care provider.  2023 Elsevier/Gold Standard (2021-01-23 00:00:00)   Vitamin B12 Injection What is this medication? Vitamin B12 (VAHY tuh min B12) prevents and treats low vitamin B12 levels in   your body. It is used in people who do not get enough vitamin B12 from their diet or when their digestive tract does not absorb enough. Vitamin B12 plays an important role in maintaining the health of your nervous system and red blood cells. This medicine may  be used for other purposes; ask your health care provider or pharmacist if you have questions. COMMON BRAND NAME(S): B-12 Compliance Kit, B-12 Injection Kit, Cyomin, Dodex, LA-12, Nutri-Twelve, Physicians EZ Use B-12, Primabalt What should I tell my care team before I take this medication? They need to know if you have any of these conditions: Kidney disease Leber's disease Megaloblastic anemia An unusual or allergic reaction to cyanocobalamin, cobalt, other medications, foods, dyes, or preservatives Pregnant or trying to get pregnant Breast-feeding How should I use this medication? This medication is injected into a muscle or deeply under the skin. It is usually given in a clinic or care team's office. However, your care team may teach you how to inject yourself. Follow all instructions. Talk to your care team about the use of this medication in children. Special care may be needed. Overdosage: If you think you have taken too much of this medicine contact a poison control center or emergency room at once. NOTE: This medicine is only for you. Do not share this medicine with others. What if I miss a dose? If you are given your dose at a clinic or care team's office, call to reschedule your appointment. If you give your own injections, and you miss a dose, take it as soon as you can. If it is almost time for your next dose, take only that dose. Do not take double or extra doses. What may interact with this medication? Alcohol Colchicine This list may not describe all possible interactions. Give your health care provider a list of all the medicines, herbs, non-prescription drugs, or dietary supplements you use. Also tell them if you smoke, drink alcohol, or use illegal drugs. Some items may interact with your medicine. What should I watch for while using this medication? Visit your care team regularly. You may need blood work done while you are taking this medication. You may need to follow a  special diet. Talk to your care team. Limit your alcohol intake and avoid smoking to get the best benefit. What side effects may I notice from receiving this medication? Side effects that you should report to your care team as soon as possible: Allergic reactions--skin rash, itching, hives, swelling of the face, lips, tongue, or throat Swelling of the ankles, hands, or feet Trouble breathing Side effects that usually do not require medical attention (report to your care team if they continue or are bothersome): Diarrhea This list may not describe all possible side effects. Call your doctor for medical advice about side effects. You may report side effects to FDA at 1-800-FDA-1088. Where should I keep my medication? Keep out of the reach of children. Store at room temperature between 15 and 30 degrees C (59 and 85 degrees F). Protect from light. Throw away any unused medication after the expiration date. NOTE: This sheet is a summary. It may not cover all possible information. If you have questions about this medicine, talk to your doctor, pharmacist, or health care provider.  2023 Elsevier/Gold Standard (2021-05-12 00:00:00)  

## 2022-03-25 NOTE — Progress Notes (Signed)
Pt declined to stay for 30 min post obs, observed for 10 min, ambulatory to lobby with [facility] upon discharge

## 2022-04-02 ENCOUNTER — Inpatient Hospital Stay: Payer: Medicare Other

## 2022-04-02 ENCOUNTER — Other Ambulatory Visit: Payer: Self-pay

## 2022-04-02 VITALS — BP 130/54 | HR 63 | Temp 98.7°F | Resp 18

## 2022-04-02 DIAGNOSIS — D509 Iron deficiency anemia, unspecified: Secondary | ICD-10-CM

## 2022-04-02 DIAGNOSIS — E538 Deficiency of other specified B group vitamins: Secondary | ICD-10-CM | POA: Diagnosis not present

## 2022-04-02 DIAGNOSIS — Z79899 Other long term (current) drug therapy: Secondary | ICD-10-CM | POA: Diagnosis not present

## 2022-04-02 MED ORDER — SODIUM CHLORIDE 0.9 % IV SOLN
300.0000 mg | Freq: Once | INTRAVENOUS | Status: AC
  Administered 2022-04-02: 300 mg via INTRAVENOUS
  Filled 2022-04-02: qty 300

## 2022-04-02 MED ORDER — SODIUM CHLORIDE 0.9 % IV SOLN: Freq: Once | INTRAVENOUS | Status: AC

## 2022-04-02 MED ORDER — ACETAMINOPHEN 325 MG PO TABS
650.0000 mg | ORAL_TABLET | Freq: Once | ORAL | Status: AC
  Administered 2022-04-02: 650 mg via ORAL
  Filled 2022-04-02: qty 2

## 2022-04-02 MED ORDER — CYANOCOBALAMIN 1000 MCG/ML IJ SOLN
1000.0000 ug | Freq: Once | INTRAMUSCULAR | Status: AC
  Administered 2022-04-02: 1000 ug via INTRAMUSCULAR
  Filled 2022-04-02: qty 1

## 2022-04-02 MED ORDER — DIPHENHYDRAMINE HCL 25 MG PO CAPS
25.0000 mg | ORAL_CAPSULE | Freq: Once | ORAL | Status: AC
  Administered 2022-04-02: 25 mg via ORAL
  Filled 2022-04-02: qty 1

## 2022-04-02 NOTE — Patient Instructions (Signed)
Iron Sucrose Injection What is this medication? IRON SUCROSE (EYE ern SOO krose) treats low levels of iron (iron deficiency anemia) in people with kidney disease. Iron is a mineral that plays an important role in making red blood cells, which carry oxygen from your lungs to the rest of your body. This medicine may be used for other purposes; ask your health care provider or pharmacist if you have questions. COMMON BRAND NAME(S): Venofer What should I tell my care team before I take this medication? They need to know if you have any of these conditions: Anemia not caused by low iron levels Heart disease High levels of iron in the blood Kidney disease Liver disease An unusual or allergic reaction to iron, other medications, foods, dyes, or preservatives Pregnant or trying to get pregnant Breast-feeding How should I use this medication? This medication is for infusion into a vein. It is given in a hospital or clinic setting. Talk to your care team about the use of this medication in children. While this medication may be prescribed for children as young as 2 years for selected conditions, precautions do apply. Overdosage: If you think you have taken too much of this medicine contact a poison control center or emergency room at once. NOTE: This medicine is only for you. Do not share this medicine with others. What if I miss a dose? It is important not to miss your dose. Call your care team if you are unable to keep an appointment. What may interact with this medication? Do not take this medication with any of the following: Deferoxamine Dimercaprol Other iron products This medication may also interact with the following: Chloramphenicol Deferasirox This list may not describe all possible interactions. Give your health care provider a list of all the medicines, herbs, non-prescription drugs, or dietary supplements you use. Also tell them if you smoke, drink alcohol, or use illegal drugs.  Some items may interact with your medicine. What should I watch for while using this medication? Visit your care team regularly. Tell your care team if your symptoms do not start to get better or if they get worse. You may need blood work done while you are taking this medication. You may need to follow a special diet. Talk to your care team. Foods that contain iron include: whole grains/cereals, dried fruits, beans, or peas, leafy green vegetables, and organ meats (liver, kidney). What side effects may I notice from receiving this medication? Side effects that you should report to your care team as soon as possible: Allergic reactions--skin rash, itching, hives, swelling of the face, lips, tongue, or throat Low blood pressure--dizziness, feeling faint or lightheaded, blurry vision Shortness of breath Side effects that usually do not require medical attention (report to your care team if they continue or are bothersome): Flushing Headache Joint pain Muscle pain Nausea Pain, redness, or irritation at injection site This list may not describe all possible side effects. Call your doctor for medical advice about side effects. You may report side effects to FDA at 1-800-FDA-1088. Where should I keep my medication? This medication is given in a hospital or clinic and will not be stored at home. NOTE: This sheet is a summary. It may not cover all possible information. If you have questions about this medicine, talk to your doctor, pharmacist, or health care provider.  2023 Elsevier/Gold Standard (2021-01-23 00:00:00)   Vitamin B12 Injection What is this medication? Vitamin B12 (VAHY tuh min B12) prevents and treats low vitamin B12 levels in   your body. It is used in people who do not get enough vitamin B12 from their diet or when their digestive tract does not absorb enough. Vitamin B12 plays an important role in maintaining the health of your nervous system and red blood cells. This medicine may  be used for other purposes; ask your health care provider or pharmacist if you have questions. COMMON BRAND NAME(S): B-12 Compliance Kit, B-12 Injection Kit, Cyomin, Dodex, LA-12, Nutri-Twelve, Physicians EZ Use B-12, Primabalt What should I tell my care team before I take this medication? They need to know if you have any of these conditions: Kidney disease Leber's disease Megaloblastic anemia An unusual or allergic reaction to cyanocobalamin, cobalt, other medications, foods, dyes, or preservatives Pregnant or trying to get pregnant Breast-feeding How should I use this medication? This medication is injected into a muscle or deeply under the skin. It is usually given in a clinic or care team's office. However, your care team may teach you how to inject yourself. Follow all instructions. Talk to your care team about the use of this medication in children. Special care may be needed. Overdosage: If you think you have taken too much of this medicine contact a poison control center or emergency room at once. NOTE: This medicine is only for you. Do not share this medicine with others. What if I miss a dose? If you are given your dose at a clinic or care team's office, call to reschedule your appointment. If you give your own injections, and you miss a dose, take it as soon as you can. If it is almost time for your next dose, take only that dose. Do not take double or extra doses. What may interact with this medication? Alcohol Colchicine This list may not describe all possible interactions. Give your health care provider a list of all the medicines, herbs, non-prescription drugs, or dietary supplements you use. Also tell them if you smoke, drink alcohol, or use illegal drugs. Some items may interact with your medicine. What should I watch for while using this medication? Visit your care team regularly. You may need blood work done while you are taking this medication. You may need to follow a  special diet. Talk to your care team. Limit your alcohol intake and avoid smoking to get the best benefit. What side effects may I notice from receiving this medication? Side effects that you should report to your care team as soon as possible: Allergic reactions--skin rash, itching, hives, swelling of the face, lips, tongue, or throat Swelling of the ankles, hands, or feet Trouble breathing Side effects that usually do not require medical attention (report to your care team if they continue or are bothersome): Diarrhea This list may not describe all possible side effects. Call your doctor for medical advice about side effects. You may report side effects to FDA at 1-800-FDA-1088. Where should I keep my medication? Keep out of the reach of children. Store at room temperature between 15 and 30 degrees C (59 and 85 degrees F). Protect from light. Throw away any unused medication after the expiration date. NOTE: This sheet is a summary. It may not cover all possible information. If you have questions about this medicine, talk to your doctor, pharmacist, or health care provider.  2023 Elsevier/Gold Standard (2021-05-12 00:00:00)  

## 2022-04-06 DIAGNOSIS — E1142 Type 2 diabetes mellitus with diabetic polyneuropathy: Secondary | ICD-10-CM | POA: Diagnosis not present

## 2022-04-06 DIAGNOSIS — I680 Cerebral amyloid angiopathy: Secondary | ICD-10-CM | POA: Diagnosis not present

## 2022-04-06 DIAGNOSIS — E039 Hypothyroidism, unspecified: Secondary | ICD-10-CM | POA: Diagnosis not present

## 2022-04-06 DIAGNOSIS — N1831 Chronic kidney disease, stage 3a: Secondary | ICD-10-CM | POA: Diagnosis not present

## 2022-04-06 DIAGNOSIS — D126 Benign neoplasm of colon, unspecified: Secondary | ICD-10-CM | POA: Diagnosis not present

## 2022-04-06 DIAGNOSIS — R0789 Other chest pain: Secondary | ICD-10-CM | POA: Diagnosis not present

## 2022-04-06 DIAGNOSIS — H8110 Benign paroxysmal vertigo, unspecified ear: Secondary | ICD-10-CM | POA: Diagnosis not present

## 2022-04-06 DIAGNOSIS — E669 Obesity, unspecified: Secondary | ICD-10-CM | POA: Diagnosis not present

## 2022-04-06 DIAGNOSIS — R269 Unspecified abnormalities of gait and mobility: Secondary | ICD-10-CM | POA: Diagnosis not present

## 2022-04-06 DIAGNOSIS — F411 Generalized anxiety disorder: Secondary | ICD-10-CM | POA: Diagnosis not present

## 2022-04-06 DIAGNOSIS — E785 Hyperlipidemia, unspecified: Secondary | ICD-10-CM | POA: Diagnosis not present

## 2022-04-06 DIAGNOSIS — I10 Essential (primary) hypertension: Secondary | ICD-10-CM | POA: Diagnosis not present

## 2022-04-08 ENCOUNTER — Inpatient Hospital Stay: Payer: Medicare Other

## 2022-04-08 VITALS — BP 143/58 | HR 66 | Temp 98.4°F | Resp 18

## 2022-04-08 DIAGNOSIS — Z79899 Other long term (current) drug therapy: Secondary | ICD-10-CM | POA: Diagnosis not present

## 2022-04-08 DIAGNOSIS — D509 Iron deficiency anemia, unspecified: Secondary | ICD-10-CM

## 2022-04-08 DIAGNOSIS — E538 Deficiency of other specified B group vitamins: Secondary | ICD-10-CM | POA: Diagnosis not present

## 2022-04-08 MED ORDER — CYANOCOBALAMIN 1000 MCG/ML IJ SOLN
1000.0000 ug | Freq: Once | INTRAMUSCULAR | Status: AC
  Administered 2022-04-08: 1000 ug via INTRAMUSCULAR
  Filled 2022-04-08: qty 1

## 2022-04-08 NOTE — Progress Notes (Signed)
Per Cassie, PA no IV iron today, b12 injection only.

## 2022-04-08 NOTE — Patient Instructions (Signed)
Vitamin B12 Injection What is this medication? Vitamin B12 (VAHY tuh min B12) prevents and treats low vitamin B12 levels in your body. It is used in people who do not get enough vitamin B12 from their diet or when their digestive tract does not absorb enough. Vitamin B12 plays an important role in maintaining the health of your nervous system and red blood cells. This medicine may be used for other purposes; ask your health care provider or pharmacist if you have questions. COMMON BRAND NAME(S): B-12 Compliance Kit, B-12 Injection Kit, Cyomin, Dodex, LA-12, Nutri-Twelve, Physicians EZ Use B-12, Primabalt What should I tell my care team before I take this medication? They need to know if you have any of these conditions: Kidney disease Leber's disease Megaloblastic anemia An unusual or allergic reaction to cyanocobalamin, cobalt, other medications, foods, dyes, or preservatives Pregnant or trying to get pregnant Breast-feeding How should I use this medication? This medication is injected into a muscle or deeply under the skin. It is usually given in a clinic or care team's office. However, your care team may teach you how to inject yourself. Follow all instructions. Talk to your care team about the use of this medication in children. Special care may be needed. Overdosage: If you think you have taken too much of this medicine contact a poison control center or emergency room at once. NOTE: This medicine is only for you. Do not share this medicine with others. What if I miss a dose? If you are given your dose at a clinic or care team's office, call to reschedule your appointment. If you give your own injections, and you miss a dose, take it as soon as you can. If it is almost time for your next dose, take only that dose. Do not take double or extra doses. What may interact with this medication? Alcohol Colchicine This list may not describe all possible interactions. Give your health care  provider a list of all the medicines, herbs, non-prescription drugs, or dietary supplements you use. Also tell them if you smoke, drink alcohol, or use illegal drugs. Some items may interact with your medicine. What should I watch for while using this medication? Visit your care team regularly. You may need blood work done while you are taking this medication. You may need to follow a special diet. Talk to your care team. Limit your alcohol intake and avoid smoking to get the best benefit. What side effects may I notice from receiving this medication? Side effects that you should report to your care team as soon as possible: Allergic reactions--skin rash, itching, hives, swelling of the face, lips, tongue, or throat Swelling of the ankles, hands, or feet Trouble breathing Side effects that usually do not require medical attention (report to your care team if they continue or are bothersome): Diarrhea This list may not describe all possible side effects. Call your doctor for medical advice about side effects. You may report side effects to FDA at 1-800-FDA-1088. Where should I keep my medication? Keep out of the reach of children. Store at room temperature between 15 and 30 degrees C (59 and 85 degrees F). Protect from light. Throw away any unused medication after the expiration date. NOTE: This sheet is a summary. It may not cover all possible information. If you have questions about this medicine, talk to your doctor, pharmacist, or health care provider.  2023 Elsevier/Gold Standard (2021-05-12 00:00:00)

## 2022-04-30 ENCOUNTER — Ambulatory Visit: Payer: Medicare Other | Admitting: Podiatry

## 2022-05-13 ENCOUNTER — Inpatient Hospital Stay: Payer: Medicare Other | Attending: Physician Assistant

## 2022-05-13 ENCOUNTER — Other Ambulatory Visit: Payer: Self-pay

## 2022-05-13 DIAGNOSIS — D509 Iron deficiency anemia, unspecified: Secondary | ICD-10-CM

## 2022-05-13 DIAGNOSIS — Z79899 Other long term (current) drug therapy: Secondary | ICD-10-CM | POA: Insufficient documentation

## 2022-05-13 DIAGNOSIS — E538 Deficiency of other specified B group vitamins: Secondary | ICD-10-CM | POA: Insufficient documentation

## 2022-05-13 MED ORDER — CYANOCOBALAMIN 1000 MCG/ML IJ SOLN
1000.0000 ug | Freq: Once | INTRAMUSCULAR | Status: AC
  Administered 2022-05-13: 1000 ug via INTRAMUSCULAR
  Filled 2022-05-13: qty 1

## 2022-05-13 NOTE — Patient Instructions (Signed)
Vitamin B12 Deficiency Vitamin B12 deficiency means that your body does not have enough vitamin B12. The body needs this important vitamin: To make red blood cells. To make genes (DNA). To help the nerves work. If you do not have enough vitamin B12 in your body, you can have health problems, such as not having enough red blood cells in the blood (anemia). What are the causes? Not eating enough foods that contain vitamin B12. Not being able to take in (absorb) vitamin B12 from the food that you eat. Certain diseases. A condition in which the body does not make enough of a certain protein. This results in your body not taking in enough vitamin B12. Having a surgery in which part of the stomach or small intestine is taken out. Taking medicines that make it hard for the body to take in vitamin B12. These include: Heartburn medicines. Some medicines that are used to treat diabetes. What increases the risk? Being an older adult. Eating a vegetarian or vegan diet that does not include any foods that come from animals. Not eating enough foods that contain vitamin B12 while you are pregnant. Taking certain medicines. Having alcoholism. What are the signs or symptoms? In some cases, there are no symptoms. If the condition leads to too few blood cells or nerve damage, symptoms can occur, such as: Feeling weak or tired. Not being hungry. Losing feeling (numbness) or tingling in your hands and feet. Redness and burning of the tongue. Feeling sad (depressed). Confusion or memory problems. Trouble walking. If anemia is very bad, symptoms can include: Being short of breath. Being dizzy. Having a very fast heartbeat. How is this treated? Changing the way you eat and drink, such as: Eating more foods that contain vitamin B12. Drinking little or no alcohol. Getting vitamin B12 shots. Taking vitamin B12 supplements by mouth (orally). Your doctor will tell you the dose that is best for you. Follow  these instructions at home: Eating and drinking  Eat foods that come from animals and have a lot of vitamin B12 in them. These include: Meats and poultry. This includes beef, pork, chicken, turkey, and organ meats, such as liver. Seafood, such as clams, rainbow trout, salmon, tuna, and haddock. Eggs. Dairy foods such as milk, yogurt, and cheese. Eat breakfast cereals that have vitamin B12 added to them (are fortified). Check the label. The items listed above may not be a complete list of foods and beverages you can eat and drink. Contact a dietitian for more information. Alcohol use Do not drink alcohol if: Your doctor tells you not to drink. You are pregnant, may be pregnant, or are planning to become pregnant. If you drink alcohol: Limit how much you have to: 0-1 drink a day for women. 0-2 drinks a day for men. Know how much alcohol is in your drink. In the [place] one drink equals one 12 oz bottle of beer (355 mL), one 5 oz glass of wine (148 mL), or one 1 oz glass of hard liquor (44 mL). General instructions Get any vitamin B12 shots if told by your doctor. Take supplements only as told by your doctor. Follow the directions. Keep all follow-up visits. Contact a doctor if: Your symptoms come back. Your symptoms get worse or do not get better with treatment. Get help right away if: You have trouble breathing. You have a very fast heartbeat. You have chest pain. You get dizzy. You faint. These symptoms may be an emergency. Get help right away. Call 911.   Do not wait to see if the symptoms will go away. Do not drive yourself to the hospital. Summary Vitamin B12 deficiency means that your body is not getting enough of the vitamin. In some cases, there are no symptoms of this condition. Treatment may include making a change in the way you eat and drink, getting shots, or taking supplements. Eat foods that have vitamin B12 in them. This information is not intended to replace advice  given to you by your health care provider. Make sure you discuss any questions you have with your health care provider. Document Revised: 04/24/2021 Document Reviewed: 04/24/2021 Elsevier Patient Education  2023 Elsevier Inc.  

## 2022-05-14 ENCOUNTER — Ambulatory Visit (INDEPENDENT_AMBULATORY_CARE_PROVIDER_SITE_OTHER): Payer: Medicare Other | Admitting: Podiatry

## 2022-05-14 ENCOUNTER — Encounter: Payer: Self-pay | Admitting: Physician Assistant

## 2022-05-14 ENCOUNTER — Encounter: Payer: Self-pay | Admitting: Podiatry

## 2022-05-14 DIAGNOSIS — M79675 Pain in left toe(s): Secondary | ICD-10-CM

## 2022-05-14 DIAGNOSIS — B351 Tinea unguium: Secondary | ICD-10-CM | POA: Diagnosis not present

## 2022-05-14 DIAGNOSIS — E119 Type 2 diabetes mellitus without complications: Secondary | ICD-10-CM

## 2022-05-14 DIAGNOSIS — M79674 Pain in right toe(s): Secondary | ICD-10-CM | POA: Diagnosis not present

## 2022-05-14 NOTE — Progress Notes (Signed)
  Subjective:  Patient ID: Pamela [name], female    DOB: April 05, 1946,  MRN: [id number]  Pamela [name] presents to clinic today for preventative diabetic foot care and painful elongated mycotic toenails 1-5 bilaterally which are tender when wearing enclosed shoe gear. Pain is relieved with periodic professional debridement.  Patient did not check blood glucose this morning.  New problem(s): None.   PCP is Reynold Bowen, MD , and last visit was  April 06, 2022  Allergies  Allergen Reactions   Demerol [Meperidine] Other (See Comments)    Unspecified    Macrobid [Nitrofurantoin Macrocrystal] Other (See Comments)    Unspecified    Macrobid [Nitrofurantoin]     Other reaction(s): hearing loss in R ear   Meclizine Other (See Comments)    Unspecified    Sulfa Antibiotics Other (See Comments)    Unspecified    Review of Systems: Negative except as noted in the HPI.  Objective: No changes noted in today's physical examination. [name] is a pleasant 76 y.o. female in NAD. AAO x 3. Vascular Capillary refill time to digits immediate b/l. Palpable DP pulse(s) b/l LE. Palpable PT pulse(s) b/l LE. No pain with calf compression b/l. No edema noted b/l LE. No cyanosis or clubbing noted b/l LE. Raised venous nodule noted dorsal aspect right hallux along EHL tendon with blue hue, nonpulsatile and asymptomatic.  Neurologic Normal speech. Oriented to person, place, and time. Protective sensation intact 5/5 intact bilaterally with 10g monofilament b/l.  Dermatologic No open wounds b/l LE. No interdigital macerations noted b/l LE. Toenails 1-5 bilaterally elongated, discolored, dystrophic, thickened, and crumbly with subungual debris and tenderness to dorsal palpation. No hyperkeratotic nor porokeratotic lesions present on today's visit.  Orthopedic: Muscle strength 5/5 to all lower extremity muscle groups bilaterally. No gross bony deformities bilaterally. HAV with bunion deformity noted b/l LE.  Pes planus deformity noted bilateral LE. Patient ambulates independent of any assistive aids.   Radiographs: None  Assessment/Plan: 1. Pain due to onychomycosis of toenails of both feet   2. Diabetes mellitus without complication ([place])     -Examined patient. -Patient to continue soft, supportive shoe gear daily. -Mycotic toenails 1-5 bilaterally were debrided in length and girth with sterile nail nippers and dremel without incident. -Patient/POA to call should there be question/concern in the interim.   Return in about 9 weeks (around 07/16/2022).  Marzetta Board, DPM

## 2022-05-27 ENCOUNTER — Encounter (INDEPENDENT_AMBULATORY_CARE_PROVIDER_SITE_OTHER): Payer: Medicare Other | Admitting: Ophthalmology

## 2022-06-08 ENCOUNTER — Encounter (INDEPENDENT_AMBULATORY_CARE_PROVIDER_SITE_OTHER): Payer: Medicare Other | Admitting: Ophthalmology

## 2022-06-10 ENCOUNTER — Inpatient Hospital Stay: Payer: Medicare Other

## 2022-06-10 ENCOUNTER — Other Ambulatory Visit: Payer: Self-pay | Admitting: Medical Oncology

## 2022-06-10 ENCOUNTER — Other Ambulatory Visit: Payer: Self-pay

## 2022-06-10 ENCOUNTER — Encounter: Payer: Self-pay | Admitting: Internal Medicine

## 2022-06-10 ENCOUNTER — Inpatient Hospital Stay: Payer: Medicare Other | Attending: Physician Assistant

## 2022-06-10 ENCOUNTER — Inpatient Hospital Stay (HOSPITAL_BASED_OUTPATIENT_CLINIC_OR_DEPARTMENT_OTHER): Payer: Medicare Other | Admitting: Internal Medicine

## 2022-06-10 VITALS — BP 167/74 | HR 70 | Temp 98.1°F | Resp 16 | Wt 205.2 lb

## 2022-06-10 DIAGNOSIS — E538 Deficiency of other specified B group vitamins: Secondary | ICD-10-CM | POA: Insufficient documentation

## 2022-06-10 DIAGNOSIS — D509 Iron deficiency anemia, unspecified: Secondary | ICD-10-CM | POA: Insufficient documentation

## 2022-06-10 DIAGNOSIS — Z79899 Other long term (current) drug therapy: Secondary | ICD-10-CM | POA: Diagnosis not present

## 2022-06-10 DIAGNOSIS — D500 Iron deficiency anemia secondary to blood loss (chronic): Secondary | ICD-10-CM | POA: Diagnosis not present

## 2022-06-10 LAB — CBC WITH DIFFERENTIAL (CANCER CENTER ONLY)
Abs Immature Granulocytes: 0.02 10*3/uL (ref 0.00–0.07)
Basophils Absolute: 0.1 10*3/uL (ref 0.0–0.1)
Basophils Relative: 1 %
Eosinophils Absolute: 0.2 10*3/uL (ref 0.0–0.5)
Eosinophils Relative: 3 %
HCT: 35.2 % — ABNORMAL LOW (ref 36.0–46.0)
Hemoglobin: 11.7 g/dL — ABNORMAL LOW (ref 12.0–15.0)
Immature Granulocytes: 0 %
Lymphocytes Relative: 26 %
Lymphs Abs: 2 10*3/uL (ref 0.7–4.0)
MCH: 29 pg (ref 26.0–34.0)
MCHC: 33.2 g/dL (ref 30.0–36.0)
MCV: 87.3 fL (ref 80.0–100.0)
Monocytes Absolute: 0.6 10*3/uL (ref 0.1–1.0)
Monocytes Relative: 8 %
Neutro Abs: 4.8 10*3/uL (ref 1.7–7.7)
Neutrophils Relative %: 62 %
Platelet Count: 253 10*3/uL (ref 150–400)
RBC: 4.03 MIL/uL (ref 3.87–5.11)
RDW: 15.9 % — ABNORMAL HIGH (ref 11.5–15.5)
WBC Count: 7.6 10*3/uL (ref 4.0–10.5)
nRBC: 0 % (ref 0.0–0.2)

## 2022-06-10 LAB — IRON AND IRON BINDING CAPACITY (CC-WL,HP ONLY)
Iron: 49 ug/dL (ref 28–170)
Saturation Ratios: 15 % (ref 10.4–31.8)
TIBC: 319 ug/dL (ref 250–450)
UIBC: 270 ug/dL (ref 148–442)

## 2022-06-10 LAB — FERRITIN: Ferritin: 102 ng/mL (ref 11–307)

## 2022-06-10 LAB — VITAMIN B12: Vitamin B-12: 274 pg/mL (ref 180–914)

## 2022-06-10 MED ORDER — CYANOCOBALAMIN 1000 MCG/ML IJ SOLN
1000.0000 ug | Freq: Once | INTRAMUSCULAR | Status: AC
  Administered 2022-06-10: 1000 ug via INTRAMUSCULAR
  Filled 2022-06-10: qty 1

## 2022-06-10 NOTE — Progress Notes (Signed)
[place] [phone]   Fax:([phone] [place], MD [place] [address]  DIAGNOSIS: Iron deficiency anemia likely from gastrointestinal blood loss.  PRIOR THERAPY: Iron infusion with Venofer 300 Mg IV weekly for 3 weeks in addition to vitamin B12 injection.  CURRENT THERAPY: Ferrous sulfate 324 mg p.o. daily  INTERVAL HISTORY: Pamela [name] 76 y.o. female returns to the clinic today for returns to the clinic today for follow-up visit accompanied by her husband.  The patient is feeling fine today with no concerning complaints except for mild fatigue.  She denied having any current chest pain, shortness of breath, cough or hemoptysis.  She has no nausea, vomiting, but has few episodes of diarrhea.  She has no headache or visual changes.  She tolerated the iron infusion with Venofer fairly well.  She is also receiving vitamin B12 injection because of the deficiency.  She will continue on oral iron tablet with ferrous sulfate once daily.  She is here today for evaluation and repeat blood work.  MEDICAL HISTORY: Past Medical History:  Diagnosis Date   Age-related nuclear cataract of right eye 04/15/2020   Amyloidosis ([place])    organ limited   Arthritis    Cataract    Colon polyps    Diabetes mellitus ([place])    Diverticulosis    Gallstones    GERD (gastroesophageal reflux disease)    Hearing loss    right ear   Heart murmur    Helicobacter pylori gastritis    Hiatal hernia    Hyperlipidemia    Hypertension    Hypothyroidism    Tachycardia    Tubular adenoma of colon    Vitreomacular adhesion of right eye 04/15/2020    ALLERGIES:  is allergic to demerol [meperidine], macrobid [nitrofurantoin macrocrystal], macrobid [nitrofurantoin], meclizine, and sulfa antibiotics.  MEDICATIONS:  Current Outpatient Medications  Medication Sig Dispense Refill   amLODipine (NORVASC) 5 MG tablet Take 1 tablet  (5 mg total) by mouth daily. 90 tablet 3   aspirin 81 MG EC tablet 1 tablet     calcium carbonate (TUMS EX) 750 MG chewable tablet Chew 1 tablet by mouth as needed for heartburn.     carvedilol (COREG) 12.5 MG tablet Take 1 tablet (12.5 mg total) by mouth 2 (two) times daily with a meal. 180 tablet 3   cholecalciferol (VITAMIN D) 25 MCG (1000 UNIT) tablet 1,000 Units daily.     Coenzyme Q10 (CO Q 10) 100 MG CAPS Take 300 mg by mouth daily.     dicyclomine (BENTYL) 20 MG tablet Take 1 tablet (20 mg total) by mouth 4 (four) times daily -  before meals and at bedtime. 360 tablet 1   empagliflozin (JARDIANCE) 10 MG TABS tablet Take 5 mg by mouth daily.     ezetimibe (ZETIA) 10 MG tablet TAKE 1 TABLET DAILY 90 tablet 3   famotidine (PEPCID) 20 MG tablet Take 20 mg by mouth daily as needed for heartburn or indigestion.     ferrous sulfate 324 (65 Fe) MG TBEC Take 1 tablet (325 mg total) by mouth every other day. 30 tablet    glimepiride (AMARYL) 2 MG tablet Take 2 mg by mouth daily before breakfast.     hydrochlorothiazide (HYDRODIURIL) 25 MG tablet Take 1 tablet (25 mg total) by mouth daily. 90 tablet 3   levothyroxine (SYNTHROID, LEVOTHROID) 137 MCG tablet Take 137 mcg by mouth daily before  breakfast. One daily, 1 and 1/2 on Sunday     linaGLIPtin-metFORMIN HCl ER 2.01-999 MG TB24 Take 2 tablets by mouth every morning.      lisinopril (ZESTRIL) 40 MG tablet Take 1 tablet (40 mg total) by mouth daily. 90 tablet 3   metroNIDAZOLE (METROCREAM) 0.75 % cream Apply 1 application topically daily as needed (rosacea).     pantoprazole (PROTONIX) 40 MG tablet Take 1 tablet (40 mg total) by mouth 2 (two) times daily. 180 tablet 3   Polyvinyl Alcohol-Povidone (REFRESH OP) Apply 1 drop to eye as needed.     rifaximin (XIFAXAN) 550 MG TABS tablet Take 1 tablet (550 mg total) by mouth 3 (three) times daily. 42 tablet 0   No current facility-administered medications for this visit.    SURGICAL HISTORY:  Past  Surgical History:  Procedure Laterality Date   ABDOMINAL HYSTERECTOMY     x 2   APPENDECTOMY     CHOLECYSTECTOMY     with appendectomy   COLONOSCOPY  12/16/2020   TONSILLECTOMY      REVIEW OF SYSTEMS:  A comprehensive review of systems was negative except for: Constitutional: positive for fatigue   PHYSICAL EXAMINATION: General appearance: alert, cooperative, fatigued, and no distress Head: Normocephalic, without obvious abnormality, atraumatic Neck: no adenopathy, no JVD, supple, symmetrical, trachea midline, and thyroid not enlarged, symmetric, no tenderness/mass/nodules Lymph nodes: Cervical, supraclavicular, and axillary nodes normal. Resp: clear to auscultation bilaterally Back: symmetric, no curvature. ROM normal. No CVA tenderness. Cardio: regular rate and rhythm, S1, S2 normal, no murmur, click, rub or gallop GI: soft, non-tender; bowel sounds normal; no masses,  no organomegaly Extremities: extremities normal, atraumatic, no cyanosis or edema  ECOG PERFORMANCE STATUS: 1 - Symptomatic but completely ambulatory  Blood pressure (!) 167/74, pulse 70, temperature 98.1 F (36.7 C), temperature source Oral, resp. rate 16, weight 205 lb 3.2 oz (93.1 kg), SpO2 100 %.  LABORATORY DATA: Lab Results  Component Value Date   WBC 8.9 03/11/2022   HGB 11.1 (L) 03/11/2022   HCT 34.8 (L) 03/11/2022   MCV 85.7 03/11/2022   PLT 310 03/11/2022      Chemistry      Component Value Date/Time   NA 140 03/11/2022 1315   NA 137 03/01/2017 1024   K 4.2 03/11/2022 1315   CL 106 03/11/2022 1315   CO2 27 03/11/2022 1315   BUN 19 03/11/2022 1315   BUN 17 03/01/2017 1024   CREATININE 1.02 (H) 03/11/2022 1315   CREATININE 0.69 09/26/2014 1607      Component Value Date/Time   CALCIUM 9.9 03/11/2022 1315   ALKPHOS 69 03/11/2022 1315   AST 14 (L) 03/11/2022 1315   ALT 10 03/11/2022 1315   BILITOT 0.3 03/11/2022 1315       RADIOGRAPHIC STUDIES: No results found.  ASSESSMENT AND  PLAN: This is a very pleasant 76 years old white female with history of iron deficiency anemia from gastrointestinal blood loss.  She also has vitamin B12 deficiency. She was treated with iron infusion with Venofer 300 Mg IV weekly for 3 weeks and she is currently on vitamin B12 injection. Repeat CBC today showed mild improvement in her hemoglobin up to 11.7 and hematocrit 35.2%.  Iron study, ferritin as well as vitamin B12 level are still pending. I recommended for the patient to continue on the oral iron tablet as well as vitamin B12 injection for now and I will see her back for follow-up visit in 3 months for evaluation  and repeat blood work. If the pending iron studies showed significant deficiency, I will arrange for the patient to receive iron infusion again with Venofer for 3 doses. For the diarrhea, she is followed by gastroenterology, Dr. Modena Nunnery and she has an appointment with her next week. The patient was advised to call immediately if she has any other concerning symptoms in the interval. The patient voices understanding of current disease status and treatment options and is in agreement with the current care plan.  All questions were answered. The patient knows to call the clinic with any problems, questions or concerns. We can certainly see the patient much sooner if necessary. The total time spent in the appointment was 20 minutes.  Disclaimer: This note was dictated with voice recognition software. Similar sounding words can inadvertently be transcribed and may not be corrected upon review.

## 2022-06-15 ENCOUNTER — Encounter: Payer: Self-pay | Admitting: Gastroenterology

## 2022-06-15 ENCOUNTER — Ambulatory Visit (INDEPENDENT_AMBULATORY_CARE_PROVIDER_SITE_OTHER): Payer: Medicare Other | Admitting: Gastroenterology

## 2022-06-15 VITALS — BP 90/80 | HR 70 | Ht 64.96 in | Wt 204.4 lb

## 2022-06-15 DIAGNOSIS — K529 Noninfective gastroenteritis and colitis, unspecified: Secondary | ICD-10-CM

## 2022-06-15 DIAGNOSIS — R194 Change in bowel habit: Secondary | ICD-10-CM | POA: Diagnosis not present

## 2022-06-15 DIAGNOSIS — D509 Iron deficiency anemia, unspecified: Secondary | ICD-10-CM | POA: Diagnosis not present

## 2022-06-15 MED ORDER — LOPERAMIDE HCL 2 MG PO CAPS: 4.0000 mg | ORAL_CAPSULE | ORAL | 3 refills | Status: DC | PRN

## 2022-06-15 MED ORDER — PLENVU 140 G PO SOLR: 1.0000 | Freq: Once | ORAL | 0 refills | Status: AC

## 2022-06-15 NOTE — Progress Notes (Signed)
Referring Provider: Reynold Bowen, MD Primary Care Physician:  Reynold Bowen, MD  Chief complaint:  abdominal pain and diarrhea   IMPRESSION:  Diarrhea with associated LLQ abdominal pain Recent weight loss - weight has now stabilized    - she attributes weight loss to adaptation of high fiber diet and recent illness    - last EGD and colonoscopy 12/2020    - no source on CT abd/pelvis 5/23    - she is ultimately pleased with her weight loss and would ultimately like to lose more Iron deficiency anemia without overt bleeding Recent ICH due to cerebral amyloid angiopathy History of H pylori gastritis    -  follow-up testing documented resolution History of reflux on pantoprazole 40 mg BID    - esophageal biopsies negative for EOE History of colon polyps    - 2 tubular adenomas 2015    - 1 tubular adenoma 2022    - surveillance colonoscopy recommended in 7 years Diverticulosis without history of diverticulitis  Altered bowel habits and weight loss not explained by recent CT scan. Ongoing diarrhea, intermittent abdominal pain and iron deficiency are also not explained by recent CT. Colon and duodenal biopsies were not obtained at the time of last endoscopy because diarrhea was not a prominent symptom at that time. Unifying diagnosis could be amyloid. Must also consider post-infectious IBS, celiac, IBD-D or even long Covid.  She could also have concurrent diagnoses including microscopic colitis.   PLAN: - Continue high fiber diet - Continue pantoprazole 40 mg BID and Pepcid 20 mg BID - Continue dicyclomine taken 20 mg QID prn with instructions to take prior to meals - Colonoscopy with [place] biopsies - EGD with small bowel biopsies - Trial of Imodium - Trial of eluxadoline 100 mg BID if not responding to Imodium - Follow-up in 4-5 months, earlier as needed - Consider capsule endoscopy if EGD and colonoscopy are non-diagnostic  HPI: Pamela [name] is a 76 y.o. female who  returns in follow-up for diarrhea. The interval history is obtained through the patient and her husband, who accompanies her and contributes to the history.  I've also reviewed her recent records in the electronic health record.   She has type 2 diabetes mellitus, GERD, hypertension, hyperlipidemia, chronic anemia with baseline hemoglobin 10-12, H pylori gastritis with follow-up negative testing, Covid in November 2022 with prolonged symptoms, and  ICH 12/22 attributed to cerebral amyloid angiopathy. Neurology notes mention evaluation for possible amyloid angiopathy.   She continues to have intermittent LLQ pain and watery, explosive diarrhea despite 14 days of empiric Xifaxan.  She is having 2 to more than 5 bowel movements daily.  Diarrhea is often postprandial and occurs within minutes of eating. She has had some accidents and has passed some stool while urinating. At times, the diarrhea awakes her from sleep although she has not had nocturnal accidents. Abdominal pain improves with defecation. She thinks she may have a sensitive stomach. She is not getting out of the house as much as she would like due to diarrhea despite using dicyclomine 3-4 times daily.  Rare blood on the toilet paper after multiple BMs in one day but otherwise there is no overt bleeding.  She has started IV iron and B12 replacements with the hematologist thinking the anemia is due to GI blood loss. She has mild fatigue.  Labs 08/18/21 Hemoglobin 11, MCV 87.9, RDW 17.5, platelets 278 Labs 09/11/2021 hemoglobin 11.7, iron 51, ferritin 24.5 Labs 03/11/22: iron 45, hemoglobin  11.1, MCV 85.7, RDW 16, platelets 310 Labs 06/10/22: iron 49, ferritin 102, hemoglobin 11.7, MCV 87.3, RDW 15.9, platelets 253  CT of the abdomen and pelvis with contrast 01/13/2022 showed colonic diverticulosis, a small hiatal hernia, a moderate-sized umbilical hernia containing fat, and aortic atherosclerosis.   Endoscopic history: - Colonoscopy 2005: large IC  valve but no polyps - Colonoscopy 2015: pancolonic diverticulosis and 2 tubular adenomas - Colonoscopy 12/16/20: diverticulosis, 23m descending colon tubular adenoma - EGD 12/16/20: irregular Z-line, biopsies confirming reflux and H. pylori gastritis. No EOE.  Past Medical History:  Diagnosis Date   Age-related nuclear cataract of right eye 04/15/2020   [place]    organ limited   Arthritis    Cataract    Colon polyps    Diabetes mellitus ([place]    Diverticulosis    Gallstones    GERD (gastroesophageal reflux disease)    Hearing loss    right ear   Heart murmur    Helicobacter pylori gastritis    Hiatal hernia    Hyperlipidemia    Hypertension    Hypothyroidism    Tachycardia    Tubular adenoma of colon    Vitreomacular adhesion of right eye 04/15/2020    Past Surgical History:  Procedure Laterality Date   ABDOMINAL HYSTERECTOMY     x 2   APPENDECTOMY     CHOLECYSTECTOMY     with appendectomy   COLONOSCOPY  12/16/2020   TONSILLECTOMY      Current Outpatient Medications  Medication Sig Dispense Refill   amLODipine (NORVASC) 5 MG tablet Take 1 tablet (5 mg total) by mouth daily. 90 tablet 3   aspirin 81 MG EC tablet 1 tablet     calcium carbonate (TUMS EX) 750 MG chewable tablet Chew 1 tablet by mouth as needed for heartburn.     carvedilol (COREG) 12.5 MG tablet Take 1 tablet (12.5 mg total) by mouth 2 (two) times daily with a meal. 180 tablet 3   cholecalciferol (VITAMIN D) 25 MCG (1000 UNIT) tablet 1,000 Units daily.     Coenzyme Q10 (CO Q 10) 100 MG CAPS Take 300 mg by mouth daily.     dicyclomine (BENTYL) 20 MG tablet Take 1 tablet (20 mg total) by mouth 4 (four) times daily -  before meals and at bedtime. 360 tablet 1   empagliflozin (JARDIANCE) 10 MG TABS tablet Take 5 mg by mouth daily.     ezetimibe (ZETIA) 10 MG tablet TAKE 1 TABLET DAILY 90 tablet 3   famotidine (PEPCID) 20 MG tablet Take 20 mg by mouth daily as needed for heartburn or indigestion.      ferrous sulfate 324 (65 Fe) MG TBEC Take 1 tablet (325 mg total) by mouth every other day. 30 tablet    glimepiride (AMARYL) 2 MG tablet Take 2 mg by mouth daily before breakfast.     hydrochlorothiazide (HYDRODIURIL) 25 MG tablet Take 1 tablet (25 mg total) by mouth daily. 90 tablet 3   levothyroxine (SYNTHROID, LEVOTHROID) 137 MCG tablet Take 137 mcg by mouth daily before breakfast. One daily, 1 and 1/2 on Sunday     linaGLIPtin-metFORMIN HCl ER 2.01-999 MG TB24 Take 2 tablets by mouth every morning.      lisinopril (ZESTRIL) 40 MG tablet Take 1 tablet (40 mg total) by mouth daily. 90 tablet 3   metroNIDAZOLE (METROCREAM) 0.75 % cream Apply 1 application  topically daily as needed (rosacea).     pantoprazole (PROTONIX) 40 MG tablet Take  1 tablet (40 mg total) by mouth 2 (two) times daily. 180 tablet 3   Polyvinyl Alcohol-Povidone (REFRESH OP) Apply 1 drop to eye as needed.     No current facility-administered medications for this visit.    Allergies as of 06/15/2022 - Review Complete 06/15/2022  Allergen Reaction Noted   Demerol [meperidine] Other (See Comments) 12/07/2012   Macrobid [nitrofurantoin macrocrystal] Other (See Comments) 12/07/2012   Macrobid [nitrofurantoin]  03/10/2021   Meclizine Other (See Comments) 12/07/2012   Sulfa antibiotics Other (See Comments) 12/07/2012    Family History  Problem Relation Age of Onset   Hypertension Mother        also cerebral hemorrhage   Cerebral aneurysm Mother        also cerebral hemorrhage   Hypertension Father    Diabetes Father        also Alzhemiers   Alzheimer's disease Father    Breast cancer Paternal Grandmother    CAD Maternal Grandfather        Hardening of the arteries   Cerebral aneurysm Maternal Grandfather    Hypertension Sister    Anxiety disorder Sister    Hypertension Son    Colon cancer Neg Hx    Esophageal cancer Neg Hx    Stomach cancer Neg Hx    Rectal cancer Neg Hx    Colon polyps Neg Hx      Physical Exam: Weight 211 pounds 04/20/21 Weight  212 pounds 06/22/10 Weight 204 pounds 09/11/21 Weight 204 pounds 02/18/22 Weight 204 pounds 06/15/22 Vital signs reviewed General:   Alert,  well-nourished, pleasant and cooperative in NAD Head:  Normocephalic and atraumatic. Eyes:  Sclera clear, no icterus.   Conjunctiva pink. Abdomen:  Soft, nontender, nondistended, normal bowel sounds, no rebound or guarding. No hepatosplenomegaly.   Neurologic:  Alert and  oriented x4;  grossly nonfocal Skin:  Intact without significant lesions or rashes. Psych:  Alert and cooperative. Normal mood and affect.  I spent over 40 minutes, including in depth chart review, communicating results with the patient directly, face-to-face time with the patient, coordinating care, ordering studies and medications as appropriate, and documentation.   Davit Vassar L. Tarri Glenn, MD, MPH 06/15/2022, 2:26 PM

## 2022-06-15 NOTE — Patient Instructions (Addendum)
It was my pleasure to provide care to you today. Based on our discussion, I am providing you with my recommendations below:  RECOMMENDATION(S):   I recommend a trial of loperamide 4 mg initially, then 2 mg after each unformed stool for ?2 days, with a maximum of 16 mg/day.  We will see if your insurance will cover eluxadoline instead of the loperamine.  I have recommended a colonoscopy and EGD with biopsies for the colon and small bowel.    FOLLOW UP:  After your procedure, you will receive a call from my office staff regarding my recommendation for follow up.  BMI:  If you are age 107 or older, your body mass index should be between 23-30. Your Body mass index is 34.05 kg/m. If this is out of the aforementioned range listed, please consider follow up with your Primary Care Provider.  If you are age 104 or younger, your body mass index should be between 19-25. Your Body mass index is 34.05 kg/m. If this is out of the aformentioned range listed, please consider follow up with your Primary Care Provider.   MY CHART:  The [facility] providers would like to encourage you to use [facility] to communicate with providers for non-urgent requests or questions.  Due to long hold times on the telephone, sending your provider a message by [facility] may be a faster and more efficient way to get a response.  Please allow 48 business hours for a response.  Please remember that this is for non-urgent requests.   Thank you for trusting me with your gastrointestinal care!    Thornton Park, MD, MPH

## 2022-06-22 ENCOUNTER — Ambulatory Visit: Payer: Medicare Other | Admitting: Podiatry

## 2022-06-29 ENCOUNTER — Ambulatory Visit: Payer: Medicare Other | Admitting: Podiatry

## 2022-07-02 ENCOUNTER — Encounter: Payer: Self-pay | Admitting: Physician Assistant

## 2022-07-08 ENCOUNTER — Inpatient Hospital Stay: Payer: Medicare Other | Attending: Physician Assistant

## 2022-07-08 ENCOUNTER — Other Ambulatory Visit: Payer: Self-pay | Admitting: Medical Oncology

## 2022-07-08 ENCOUNTER — Other Ambulatory Visit: Payer: Self-pay | Admitting: Physician Assistant

## 2022-07-08 DIAGNOSIS — D509 Iron deficiency anemia, unspecified: Secondary | ICD-10-CM | POA: Diagnosis not present

## 2022-07-08 DIAGNOSIS — E538 Deficiency of other specified B group vitamins: Secondary | ICD-10-CM

## 2022-07-08 MED ORDER — CYANOCOBALAMIN 1000 MCG/ML IJ SOLN
1000.0000 ug | Freq: Once | INTRAMUSCULAR | Status: AC
  Administered 2022-07-08: 1000 ug via INTRAMUSCULAR
  Filled 2022-07-08: qty 1

## 2022-07-09 ENCOUNTER — Encounter: Payer: Self-pay | Admitting: Gastroenterology

## 2022-07-09 ENCOUNTER — Ambulatory Visit (AMBULATORY_SURGERY_CENTER): Payer: Medicare Other | Admitting: Gastroenterology

## 2022-07-09 VITALS — BP 125/72 | HR 77 | Temp 97.5°F | Resp 16 | Ht 64.0 in | Wt 204.0 lb

## 2022-07-09 DIAGNOSIS — K449 Diaphragmatic hernia without obstruction or gangrene: Secondary | ICD-10-CM

## 2022-07-09 DIAGNOSIS — D509 Iron deficiency anemia, unspecified: Secondary | ICD-10-CM

## 2022-07-09 DIAGNOSIS — R197 Diarrhea, unspecified: Secondary | ICD-10-CM | POA: Diagnosis not present

## 2022-07-09 DIAGNOSIS — K529 Noninfective gastroenteritis and colitis, unspecified: Secondary | ICD-10-CM

## 2022-07-09 DIAGNOSIS — R194 Change in bowel habit: Secondary | ICD-10-CM | POA: Diagnosis not present

## 2022-07-09 DIAGNOSIS — K6289 Other specified diseases of anus and rectum: Secondary | ICD-10-CM | POA: Diagnosis not present

## 2022-07-09 DIAGNOSIS — K5730 Diverticulosis of large intestine without perforation or abscess without bleeding: Secondary | ICD-10-CM

## 2022-07-09 DIAGNOSIS — K2970 Gastritis, unspecified, without bleeding: Secondary | ICD-10-CM

## 2022-07-09 MED ORDER — SODIUM CHLORIDE 0.9 % IV SOLN: 500.0000 mL | INTRAVENOUS | Status: DC

## 2022-07-09 NOTE — Patient Instructions (Addendum)
Handout on diverticulosis provided - Patient has a contact number available for emergencies. The signs and symptoms of potential delayed complications were discussed with the patient. Return to normal activities tomorrow. Written discharge instructions were provided to the patient. - High fiber diet. - Continue present medications. - Await pathology results. - Repeat colonoscopy is not recommended for colon cancer surveillance given age >9. - Follow a high fiber diet. Drink at least 64 ounces of water daily. Add a daily stool bulking agent such as psyllium (an exampled would be Metamucil). - Emerging evidence supports eating a diet of fruits, vegetables, grains, calcium, and yogurt while reducing red meat and alcohol may reduce the risk of colon cancer.  YOU HAD AN ENDOSCOPIC PROCEDURE TODAY AT [facility]:   Refer to the procedure report that was given to you for any specific questions about what was found during the examination.  If the procedure report does not answer your questions, please call your gastroenterologist to clarify.  If you requested that your care partner not be given the details of your procedure findings, then the procedure report has been included in a sealed envelope for you to review at your convenience later.  YOU SHOULD EXPECT: Some feelings of bloating in the abdomen. Passage of more gas than usual.  Walking can help get rid of the air that was put into your GI tract during the procedure and reduce the bloating. If you had a lower endoscopy (such as a colonoscopy or flexible sigmoidoscopy) you may notice spotting of blood in your stool or on the toilet paper. If you underwent a bowel prep for your procedure, you may not have a normal bowel movement for a few days.  Please Note:  You might notice some irritation and congestion in your nose or some drainage.  This is from the oxygen used during your procedure.  There is no need for concern and it should clear  up in a day or so.  SYMPTOMS TO REPORT IMMEDIATELY:  Following lower endoscopy (colonoscopy or flexible sigmoidoscopy):  Excessive amounts of blood in the stool  Significant tenderness or worsening of abdominal pains  Swelling of the abdomen that is new, acute  Fever of 100F or higher  Following upper endoscopy (EGD)  Vomiting of blood or coffee ground material  New chest pain or pain under the shoulder blades  Painful or persistently difficult swallowing  New shortness of breath  Fever of 100F or higher  Black, tarry-looking stools  For urgent or emergent issues, a gastroenterologist can be reached at any hour by calling [phone]. Do not use MyChart messaging for urgent concerns.    DIET:  We do recommend a small meal at first, but then you may proceed to your regular diet.  Drink plenty of fluids but you should avoid alcoholic beverages for 24 hours.  ACTIVITY:  You should plan to take it easy for the rest of today and you should NOT DRIVE or use heavy machinery until tomorrow (because of the sedation medicines used during the test).    FOLLOW UP: Our staff will call the number listed on your records the next business day following your procedure.  We will call around 7:15- 8:00 am to check on you and address any questions or concerns that you may have regarding the information given to you following your procedure. If we do not reach you, we will leave a message.     If any biopsies were taken you will be  contacted by phone or by letter within the next 1-3 weeks.  Please call us at [phone] if you have not heard about the biopsies in 3 weeks.    SIGNATURES/CONFIDENTIALITY: You and/or your care partner have signed paperwork which will be entered into your electronic medical record.  These signatures attest to the fact that that the information above on your After Visit Summary has been reviewed and is understood.  Full responsibility of the confidentiality of this  discharge information lies with you and/or your care-partner.

## 2022-07-09 NOTE — Progress Notes (Signed)
Pt's states no medical or surgical changes since previsit or office visit. 

## 2022-07-09 NOTE — Progress Notes (Signed)
Indication for EGD: Diarrhea, abdominal pain, history of H. pylori, weight loss Indication for colonoscopy: Diarrhea, abdominal pain, weight loss  Please see my 06/15/2022 office note for complete details.  There is been no change in history or physical exam since that time.  The patient remains an appropriate candidate for monitored anesthesia care in the endoscopy center.

## 2022-07-09 NOTE — Op Note (Signed)
[place] Patient Name: Dim Meisinger Procedure Date: 07/09/2022 2:49 PM MRN: [id number] Endoscopist: Thornton Park MD, MD, [id number] Age: 76 Referring MD:  Date of Birth: 02/24/1946 Gender: Female Account #: [id number] Procedure:                Upper GI endoscopy Indications:              Post-prandial Diarrhea Medicines:                Monitored Anesthesia Care Procedure:                Pre-Anesthesia Assessment:                           - Prior to the procedure, a History and Physical                            was performed, and patient medications and                            allergies were reviewed. The patient's tolerance of                            previous anesthesia was also reviewed. The risks                            and benefits of the procedure and the sedation                            options and risks were discussed with the patient.                            All questions were answered, and informed consent                            was obtained. Prior Anticoagulants: The patient has                            taken no anticoagulant or antiplatelet agents. ASA                            Grade Assessment: III - A patient with severe                            systemic disease. After reviewing the risks and                            benefits, the patient was deemed in satisfactory                            condition to undergo the procedure.                           After obtaining informed consent, the endoscope was  passed under direct vision. Throughout the                            procedure, the patient's blood pressure, pulse, and                            oxygen saturations were monitored continuously. The                            Endoscope was introduced through the mouth, and                            advanced to the third part of duodenum. The upper                            GI endoscopy was  accomplished without difficulty.                            The patient tolerated the procedure well. Scope In: Scope Out: Findings:                 The examined esophagus was normal.                           A hiatal hernia was present.                           Diffuse mild inflammation characterized by                            erythema, friability and granularity was found in                            the gastric body. Biopsies were taken with a cold                            forceps for histology. Estimated blood loss was                            minimal.                           The examined duodenum was normal. Biopsies were                            taken with a cold forceps for histology. Estimated                            blood loss was minimal. Complications:            No immediate complications. Estimated Blood Loss:     Estimated blood loss was minimal. Impression:               - Normal esophagus.                           -  Hiatal hernia.                           - Gastritis. Biopsied.                           - Normal examined duodenum. Biopsied. Recommendation:           - Patient has a contact number available for                            emergencies. The signs and symptoms of potential                            delayed complications were discussed with the                            patient. Return to normal activities tomorrow.                            Written discharge instructions were provided to the                            patient.                           - Resume previous diet.                           - Continue present medications.                           - Await pathology results. Thornton Park MD, MD 07/09/2022 3:47:36 PM This report has been signed electronically.

## 2022-07-09 NOTE — Progress Notes (Signed)
Called to room to assist during endoscopic procedure.  Patient ID and intended procedure confirmed with present staff. Received instructions for my participation in the procedure from the performing physician.  

## 2022-07-09 NOTE — Op Note (Signed)
[place] Patient Name: Pamela [name] Procedure Date: 07/09/2022 2:48 PM MRN: [id number] Endoscopist: Thornton Park MD, MD, [id number] Age: 76 Referring MD:  Date of Birth: 06/09/1946 Gender: Female Account #: [id number] Procedure:                Colonoscopy Indications:              Chronic diarrhea Medicines:                Monitored Anesthesia Care Procedure:                Pre-Anesthesia Assessment:                           - Prior to the procedure, a History and Physical                            was performed, and patient medications and                            allergies were reviewed. The patient's tolerance of                            previous anesthesia was also reviewed. The risks                            and benefits of the procedure and the sedation                            options and risks were discussed with the patient.                            All questions were answered, and informed consent                            was obtained. Prior Anticoagulants: The patient has                            taken no anticoagulant or antiplatelet agents. ASA                            Grade Assessment: III - A patient with severe                            systemic disease. After reviewing the risks and                            benefits, the patient was deemed in satisfactory                            condition to undergo the procedure.                           After obtaining informed consent, the colonoscope  was passed under direct vision. Throughout the                            procedure, the patient's blood pressure, pulse, and                            oxygen saturations were monitored continuously. The                            Colonoscope was introduced through the anus and                            advanced to the 5 cm into the ileum. The                            colonoscopy was performed without  difficulty. The                            patient tolerated the procedure well. The quality                            of the bowel preparation was good. The terminal                            ileum, ileocecal valve, appendiceal orifice, and                            rectum were photographed. Scope In: 3:26:54 PM Scope Out: 3:45:53 PM Scope Withdrawal Time: 0 hours 8 minutes 47 seconds  Total Procedure Duration: 0 hours 18 minutes 59 seconds  Findings:                 The perianal and digital rectal examinations were                            normal.                           The terminal ileum appeared normal. Biopsies were                            taken with a cold forceps for histology. Estimated                            blood loss was minimal.                           The colonic mucosa appeared normal. Biopsies were                            taken with a cold forceps for histology. Estimated                            blood loss was minimal.  Multiple large-mouthed, medium-mouthed and                            small-mouthed diverticula were found in the entire                            colon.                           Anal papilla(e) were hypertrophied.                           The exam was otherwise without abnormality on                            direct and retroflexion views. Complications:            No immediate complications. Estimated Blood Loss:     Estimated blood loss: none. Impression:               - The entire examined colon is normal. Biopsied.                           - Diverticulosis in the entire examined colon.                           - The examination was otherwise normal on direct                            and retroflexion views. Recommendation:           - Patient has a contact number available for                            emergencies. The signs and symptoms of potential                            delayed  complications were discussed with the                            patient. Return to normal activities tomorrow.                            Written discharge instructions were provided to the                            patient.                           - High fiber diet.                           - Continue present medications.                           - Await pathology results.                           -  Repeat colonoscopy is not recommended for colon                            cancer surveillance given age >65.                           - Follow a high fiber diet. Drink at least 64                            ounces of water daily. Add a daily stool bulking                            agent such as psyllium (an exampled would be                            Metamucil).                           - Emerging evidence supports eating a diet of                            fruits, vegetables, grains, calcium, and yogurt                            while reducing red meat and alcohol may reduce the                            risk of colon cancer. Thornton Park MD, MD 07/09/2022 3:54:04 PM This report has been signed electronically.

## 2022-07-09 NOTE — Progress Notes (Signed)
A and O x3. Report to RN. Tolerated MAC anesthesia well.Teeth unchanged after procedure. 

## 2022-07-12 ENCOUNTER — Telehealth: Payer: Self-pay | Admitting: *Deleted

## 2022-07-12 NOTE — Telephone Encounter (Signed)
Post procedure follow up call placed, no answer and left VM.  

## 2022-07-14 DIAGNOSIS — I10 Essential (primary) hypertension: Secondary | ICD-10-CM | POA: Diagnosis not present

## 2022-07-14 DIAGNOSIS — E1142 Type 2 diabetes mellitus with diabetic polyneuropathy: Secondary | ICD-10-CM | POA: Diagnosis not present

## 2022-07-14 DIAGNOSIS — E559 Vitamin D deficiency, unspecified: Secondary | ICD-10-CM | POA: Diagnosis not present

## 2022-07-14 DIAGNOSIS — R7989 Other specified abnormal findings of blood chemistry: Secondary | ICD-10-CM | POA: Diagnosis not present

## 2022-07-14 DIAGNOSIS — D649 Anemia, unspecified: Secondary | ICD-10-CM | POA: Diagnosis not present

## 2022-07-14 DIAGNOSIS — E039 Hypothyroidism, unspecified: Secondary | ICD-10-CM | POA: Diagnosis not present

## 2022-07-14 DIAGNOSIS — E785 Hyperlipidemia, unspecified: Secondary | ICD-10-CM | POA: Diagnosis not present

## 2022-07-16 ENCOUNTER — Ambulatory Visit (INDEPENDENT_AMBULATORY_CARE_PROVIDER_SITE_OTHER): Payer: Medicare Other | Admitting: Podiatry

## 2022-07-16 DIAGNOSIS — M79674 Pain in right toe(s): Secondary | ICD-10-CM

## 2022-07-16 DIAGNOSIS — E119 Type 2 diabetes mellitus without complications: Secondary | ICD-10-CM

## 2022-07-16 DIAGNOSIS — B351 Tinea unguium: Secondary | ICD-10-CM

## 2022-07-16 DIAGNOSIS — M79675 Pain in left toe(s): Secondary | ICD-10-CM

## 2022-07-19 ENCOUNTER — Other Ambulatory Visit: Payer: Self-pay | Admitting: Gastroenterology

## 2022-07-20 ENCOUNTER — Encounter: Payer: Self-pay | Admitting: Podiatry

## 2022-07-20 DIAGNOSIS — I10 Essential (primary) hypertension: Secondary | ICD-10-CM | POA: Diagnosis not present

## 2022-07-20 DIAGNOSIS — I129 Hypertensive chronic kidney disease with stage 1 through stage 4 chronic kidney disease, or unspecified chronic kidney disease: Secondary | ICD-10-CM | POA: Diagnosis not present

## 2022-07-20 DIAGNOSIS — R0789 Other chest pain: Secondary | ICD-10-CM | POA: Diagnosis not present

## 2022-07-20 DIAGNOSIS — K219 Gastro-esophageal reflux disease without esophagitis: Secondary | ICD-10-CM | POA: Diagnosis not present

## 2022-07-20 DIAGNOSIS — K529 Noninfective gastroenteritis and colitis, unspecified: Secondary | ICD-10-CM | POA: Diagnosis not present

## 2022-07-20 DIAGNOSIS — Z0000 Encounter for general adult medical examination without abnormal findings: Secondary | ICD-10-CM | POA: Diagnosis not present

## 2022-07-20 DIAGNOSIS — R269 Unspecified abnormalities of gait and mobility: Secondary | ICD-10-CM | POA: Diagnosis not present

## 2022-07-20 DIAGNOSIS — E785 Hyperlipidemia, unspecified: Secondary | ICD-10-CM | POA: Diagnosis not present

## 2022-07-20 DIAGNOSIS — D649 Anemia, unspecified: Secondary | ICD-10-CM | POA: Diagnosis not present

## 2022-07-20 DIAGNOSIS — I700 Atherosclerosis of aorta: Secondary | ICD-10-CM | POA: Diagnosis not present

## 2022-07-20 DIAGNOSIS — E039 Hypothyroidism, unspecified: Secondary | ICD-10-CM | POA: Diagnosis not present

## 2022-07-20 DIAGNOSIS — E1142 Type 2 diabetes mellitus with diabetic polyneuropathy: Secondary | ICD-10-CM | POA: Diagnosis not present

## 2022-07-20 DIAGNOSIS — N1831 Chronic kidney disease, stage 3a: Secondary | ICD-10-CM | POA: Diagnosis not present

## 2022-07-20 NOTE — Progress Notes (Signed)
  Subjective:  Patient ID: Pamela [name], female    DOB: Nov 24, 1945,  MRN: [id number]  Pamela [name] presents to clinic today for preventative diabetic foot care and painful elongated overgrown toenails which are tender when wearing enclosed shoe gear.  Chief Complaint  Patient presents with   routine foot care    New problem(s): None.   PCP is Reynold Bowen, MD , and last visit was July 14, 2022.  Allergies  Allergen Reactions   Demerol [Meperidine] Other (See Comments)    Unspecified    Macrobid [Nitrofurantoin Macrocrystal] Other (See Comments)    Unspecified    Macrobid [Nitrofurantoin]     Other reaction(s): hearing loss in R ear   Meclizine Other (See Comments)    Unspecified    Sulfa Antibiotics Other (See Comments)    Unspecified     Review of Systems: Negative except as noted in the HPI.  Objective: No changes noted in today's physical examination.  Pamela [name] is a pleasant 76 y.o. female in NAD. AAO x 3. Vascular Capillary refill time to digits immediate b/l. Palpable DP pulse(s) b/l LE. Palpable PT pulse(s) b/l LE. No pain with calf compression b/l. No edema noted b/l LE. No cyanosis or clubbing noted b/l LE. Raised venous nodule noted dorsal aspect right hallux along EHL tendon with blue hue, nonpulsatile and asymptomatic.  Neurologic Normal speech. Oriented to person, place, and time. Protective sensation intact 5/5 intact bilaterally with 10g monofilament b/l.  Dermatologic No open wounds b/l LE. No interdigital macerations noted b/l LE. Toenails 1-5 bilaterally elongated, discolored, dystrophic, thickened, and crumbly with subungual debris and tenderness to dorsal palpation. No hyperkeratotic nor porokeratotic lesions present on today's visit.  Orthopedic: Muscle strength 5/5 to all lower extremity muscle groups bilaterally. No gross bony deformities bilaterally. HAV with bunion deformity noted b/l LE. Pes planus deformity noted bilateral LE. Patient  ambulates independent of any assistive aids.   Radiographs: None Assessment/Plan: 1. Pain due to onychomycosis of toenails of both feet   2. Diabetes mellitus without complication ([place])     No orders of the defined types were placed in this encounter.   -Patient was evaluated and treated. All patient's and/or POA's questions/concerns answered on today's visit. -Continue diabetic foot care principles: inspect feet daily, monitor glucose as recommended by PCP and/or Endocrinologist, and follow prescribed diet per PCP, Endocrinologist and/or dietician. -Continue supportive shoe gear daily. -Toenails 1-5 b/l were debrided in length and girth with sterile nail nippers and dremel without iatrogenic bleeding.  -Patient/POA to call should there be question/concern in the interim.   Return in about 9 weeks (around 09/17/2022).  Marzetta Board, DPM

## 2022-07-27 ENCOUNTER — Encounter (INDEPENDENT_AMBULATORY_CARE_PROVIDER_SITE_OTHER): Payer: Medicare Other | Admitting: Ophthalmology

## 2022-08-12 ENCOUNTER — Other Ambulatory Visit: Payer: Self-pay

## 2022-08-12 ENCOUNTER — Inpatient Hospital Stay: Payer: Medicare Other | Attending: Physician Assistant

## 2022-08-12 DIAGNOSIS — D509 Iron deficiency anemia, unspecified: Secondary | ICD-10-CM | POA: Diagnosis not present

## 2022-08-12 DIAGNOSIS — E538 Deficiency of other specified B group vitamins: Secondary | ICD-10-CM | POA: Insufficient documentation

## 2022-08-12 MED ORDER — CYANOCOBALAMIN 1000 MCG/ML IJ SOLN
1000.0000 ug | Freq: Once | INTRAMUSCULAR | Status: AC
  Administered 2022-08-12: 1000 ug via INTRAMUSCULAR
  Filled 2022-08-12: qty 1

## 2022-08-25 DIAGNOSIS — H2512 Age-related nuclear cataract, left eye: Secondary | ICD-10-CM | POA: Diagnosis not present

## 2022-08-25 DIAGNOSIS — H35363 Drusen (degenerative) of macula, bilateral: Secondary | ICD-10-CM | POA: Diagnosis not present

## 2022-08-25 DIAGNOSIS — Z961 Presence of intraocular lens: Secondary | ICD-10-CM | POA: Diagnosis not present

## 2022-08-25 DIAGNOSIS — E113293 Type 2 diabetes mellitus with mild nonproliferative diabetic retinopathy without macular edema, bilateral: Secondary | ICD-10-CM | POA: Diagnosis not present

## 2022-09-09 ENCOUNTER — Inpatient Hospital Stay: Payer: Medicare Other

## 2022-09-15 ENCOUNTER — Telehealth: Payer: Self-pay

## 2022-09-15 NOTE — Telephone Encounter (Signed)
Left message for patient to call back  

## 2022-09-15 NOTE — Telephone Encounter (Signed)
Thornton Park, MD  Carl Best, RN  Please call the patient to see how she is feeling. Biopsies from endoscopic evaluation are essentially negative. If diarrhea has not improved, please start a trial of eluxadoline 100 mg BID and plan office visit with me in 3-4 weeks. Thanks.  KLB

## 2022-09-16 NOTE — Telephone Encounter (Signed)
Left message for patient to call back  

## 2022-09-17 NOTE — Telephone Encounter (Signed)
Unable to reach patient x 3. Left message for patient to call back.

## 2022-09-20 ENCOUNTER — Ambulatory Visit: Payer: Medicare Other | Admitting: Podiatry

## 2022-09-20 NOTE — Telephone Encounter (Signed)
Letter sent home

## 2022-09-29 NOTE — Telephone Encounter (Signed)
Patient returned call & has been given MD recommendations. Diarrhea has improved some, so she would prefer to hold off on medication and OV. Advised she call back if symptoms return.

## 2022-10-01 ENCOUNTER — Other Ambulatory Visit: Payer: Self-pay

## 2022-10-01 ENCOUNTER — Inpatient Hospital Stay: Payer: Medicare Other | Attending: Physician Assistant

## 2022-10-01 DIAGNOSIS — Z79899 Other long term (current) drug therapy: Secondary | ICD-10-CM | POA: Diagnosis not present

## 2022-10-01 DIAGNOSIS — E538 Deficiency of other specified B group vitamins: Secondary | ICD-10-CM | POA: Insufficient documentation

## 2022-10-01 DIAGNOSIS — D509 Iron deficiency anemia, unspecified: Secondary | ICD-10-CM | POA: Diagnosis not present

## 2022-10-01 MED ORDER — CYANOCOBALAMIN 1000 MCG/ML IJ SOLN
1000.0000 ug | Freq: Once | INTRAMUSCULAR | Status: AC
  Administered 2022-10-01: 1000 ug via INTRAMUSCULAR
  Filled 2022-10-01: qty 1

## 2022-10-18 ENCOUNTER — Ambulatory Visit (INDEPENDENT_AMBULATORY_CARE_PROVIDER_SITE_OTHER): Payer: Medicare Other | Admitting: Podiatry

## 2022-10-18 ENCOUNTER — Encounter: Payer: Self-pay | Admitting: Podiatry

## 2022-10-18 VITALS — BP 119/56

## 2022-10-18 DIAGNOSIS — B351 Tinea unguium: Secondary | ICD-10-CM

## 2022-10-18 DIAGNOSIS — M79674 Pain in right toe(s): Secondary | ICD-10-CM

## 2022-10-18 DIAGNOSIS — E119 Type 2 diabetes mellitus without complications: Secondary | ICD-10-CM

## 2022-10-18 DIAGNOSIS — M2141 Flat foot [pes planus] (acquired), right foot: Secondary | ICD-10-CM

## 2022-10-18 DIAGNOSIS — M2041 Other hammer toe(s) (acquired), right foot: Secondary | ICD-10-CM | POA: Diagnosis not present

## 2022-10-18 DIAGNOSIS — M2042 Other hammer toe(s) (acquired), left foot: Secondary | ICD-10-CM | POA: Diagnosis not present

## 2022-10-18 DIAGNOSIS — M2011 Hallux valgus (acquired), right foot: Secondary | ICD-10-CM | POA: Diagnosis not present

## 2022-10-18 DIAGNOSIS — C50919 Malignant neoplasm of unspecified site of unspecified female breast: Secondary | ICD-10-CM | POA: Insufficient documentation

## 2022-10-18 DIAGNOSIS — M79675 Pain in left toe(s): Secondary | ICD-10-CM | POA: Diagnosis not present

## 2022-10-18 DIAGNOSIS — M2012 Hallux valgus (acquired), left foot: Secondary | ICD-10-CM | POA: Diagnosis not present

## 2022-10-18 DIAGNOSIS — M2142 Flat foot [pes planus] (acquired), left foot: Secondary | ICD-10-CM | POA: Diagnosis not present

## 2022-10-18 NOTE — Progress Notes (Unsigned)
ANNUAL DIABETIC FOOT EXAM  Subjective: Pamela [name] presents today {jgcomplaint:23593}.  Chief Complaint  Patient presents with   Nail Problem    [facility] BS-Did not check today A1C-6.2 PCP-[facility] [place] PCP VST-07/2022    Patient confirms h/o diabetes.  Patient relates {Numbers; 0-100:15068} year h/o diabetes.  Patient denies any h/o foot wounds.  Patient has h/o foot ulcer of {jgPodToeLocator:23637}, which healed via help of ***.  Patient has h/o amputation(s): {jgamp:23617}.  Patient endorses symptoms of foot numbness.   Patient endorses symptoms of foot tingling.  Patient endorses symptoms of burning in feet.  Patient endorses symptoms of pins/needles sensation in feet.  Patient denies any numbness, tingling, burning, or pins/needle sensation in feet.  Patient has been diagnosed with neuropathy and it is managed with {JGNEUROPATHYMEDS:27053}.  Risk factors: {jgriskfactors:24044}.  Reynold Bowen, MD is patient's PCP. Last visit was {Time; dates multiple:15870}***.  Past Medical History:  Diagnosis Date   Age-related nuclear cataract of right eye 04/15/2020   Amyloidosis ([place])    organ limited   Arthritis    Cataract    Colon polyps    Diabetes mellitus ([place])    Diverticulosis    Gallstones    GERD (gastroesophageal reflux disease)    Hearing loss    right ear   Heart murmur    Helicobacter pylori gastritis    Hiatal hernia    Hyperlipidemia    Hypertension    Hypothyroidism    Tachycardia    Tubular adenoma of colon    Vitreomacular adhesion of right eye 04/15/2020   Patient Active Problem List   Diagnosis Date Noted   B12 deficiency 07/08/2022   Iron deficiency anemia 03/11/2022   Intraparenchymal hematoma of brain ([place]) 08/18/2021   Intracerebral hemorrhage 08/18/2021   Cerebral amyloid angiopathy (CODE) 08/18/2021   Atypical chest pain 08/18/2021   Chronic anemia 34/19/6222   Diastolic dysfunction 97/98/9211   Gastroesophageal reflux  disease without esophagitis 05/26/2021   Pseudophakia of right eye 05/21/2021   Age-related nuclear cataract of left eye 04/15/2020   Vitreomacular adhesion of left eye 04/15/2020   Chronic kidney disease, stage 3a ([place]) 10/30/2019   Encounter for general adult medical examination without abnormal findings 03/26/2019   Chronic kidney disease due to hypertension 03/08/2019   Abnormal gait 05/24/2018   Benign neoplasm of colon 01/24/2018   History of hypothyroidism 03/12/2015   Polyneuropathy due to type 2 diabetes mellitus ([place]) 02/04/2015   Callosity 06/29/2013   Obesity (BMI 30-39.9) 04/07/2013   Hypertension 04/07/2013   Diabetes mellitus without complication ([place]) 94/17/4081   Hyperlipidemia with target LDL less than 70 04/07/2013   Generalized anxiety disorder 01/29/2013   Raynaud's disease 05/05/2012   Benign paroxysmal positional vertigo 10/26/2011   Vitamin D deficiency 08/11/2009   Hypothyroidism 05/22/2009   Past Surgical History:  Procedure Laterality Date   ABDOMINAL HYSTERECTOMY     x 2   APPENDECTOMY     CHOLECYSTECTOMY     with appendectomy   COLONOSCOPY  12/16/2020   TONSILLECTOMY     Current Outpatient Medications on File Prior to Visit  Medication Sig Dispense Refill   amLODipine (NORVASC) 5 MG tablet Take 1 tablet (5 mg total) by mouth daily. 90 tablet 3   aspirin 81 MG EC tablet 1 tablet     calcium carbonate (TUMS EX) 750 MG chewable tablet Chew 1 tablet by mouth as needed for heartburn.     carvedilol (COREG) 12.5 MG tablet Take 1 tablet (12.5 mg total) by  mouth 2 (two) times daily with a meal. 180 tablet 3   cholecalciferol (VITAMIN D) 25 MCG (1000 UNIT) tablet 1,000 Units daily.     Coenzyme Q10 (CO Q 10) 100 MG CAPS Take 300 mg by mouth daily.     dicyclomine (BENTYL) 20 MG tablet Take 1 tablet (20 mg total) by mouth 4 (four) times daily -  before meals and at bedtime. 360 tablet 1   empagliflozin (JARDIANCE) 10 MG TABS tablet Take 5 mg by mouth daily.      ezetimibe (ZETIA) 10 MG tablet TAKE 1 TABLET DAILY 90 tablet 3   famotidine (PEPCID) 20 MG tablet Take 20 mg by mouth daily as needed for heartburn or indigestion.     ferrous sulfate 324 (65 Fe) MG TBEC Take 1 tablet (325 mg total) by mouth every other day. 30 tablet    glimepiride (AMARYL) 2 MG tablet Take 2 mg by mouth daily before breakfast.     hydrochlorothiazide (HYDRODIURIL) 25 MG tablet Take 1 tablet (25 mg total) by mouth daily. 90 tablet 3   levothyroxine (SYNTHROID, LEVOTHROID) 137 MCG tablet Take 137 mcg by mouth daily before breakfast. One daily, 1 and 1/2 on Sunday     linaGLIPtin-metFORMIN HCl ER 2.01-999 MG TB24 Take 2 tablets by mouth every morning.      lisinopril (ZESTRIL) 40 MG tablet Take 1 tablet (40 mg total) by mouth daily. 90 tablet 3   loperamide (IMODIUM) 2 MG capsule Take 2 capsules (4 mg total) by mouth as needed for diarrhea or loose stools. 90 capsule 3   metroNIDAZOLE (METROCREAM) 0.75 % cream Apply 1 application  topically daily as needed (rosacea).     pantoprazole (PROTONIX) 40 MG tablet TAKE 1 TABLET TWICE A DAY 180 tablet 3   Polyvinyl Alcohol-Povidone (REFRESH OP) Apply 1 drop to eye as needed.     No current facility-administered medications on file prior to visit.    Allergies  Allergen Reactions   Demerol [Meperidine] Other (See Comments)    Unspecified    Macrobid [Nitrofurantoin Macrocrystal] Other (See Comments)    Unspecified    Macrobid [Nitrofurantoin]     Other reaction(s): hearing loss in R ear   Meclizine Other (See Comments)    Unspecified    Sulfa Antibiotics Other (See Comments)    Unspecified    Social History   Occupational History   Occupation: retired  Tobacco Use   Smoking status: Never   Smokeless tobacco: Never  Vaping Use   Vaping Use: Never used  Substance and Sexual Activity   Alcohol use: No   Drug use: No   Sexual activity: Not Currently    Partners: Male    Comment: intercourse- 66, partners-1,  Married- 1 yrs   Family History  Problem Relation Age of Onset   Hypertension Mother        also cerebral hemorrhage   Cerebral aneurysm Mother        also cerebral hemorrhage   Hypertension Father    Diabetes Father        also Alzhemiers   Alzheimer's disease Father    Breast cancer Paternal Grandmother    CAD Maternal Grandfather        Hardening of the arteries   Cerebral aneurysm Maternal Grandfather    Hypertension Sister    Anxiety disorder Sister    Hypertension Son    Colon cancer Neg Hx    Esophageal cancer Neg Hx    Stomach cancer Neg  Hx    Rectal cancer Neg Hx    Colon polyps Neg Hx     There is no immunization history on file for this patient.   Review of Systems: Negative except as noted in the HPI.   Objective: Vitals:   10/18/22 1419  BP: (!) 119/56    Pamela [name] is a pleasant 77 y.o. female in NAD. AAO X 3.  Vascular Examination: {jgvascular:23595}  Dermatological Examination: {jgderm:23598}  Neurological Examination: {jgneuro:23601::"Protective sensation intact 5/5 intact bilaterally with 10g monofilament b/l.","Vibratory sensation intact b/l.","Proprioception intact bilaterally."}  Musculoskeletal Examination: {jgmsk:23600}  Footwear Assessment: Does the patient wear appropriate shoes? {Yes,No}. Does the patient need inserts/orthotics? {Yes,No}.  Lab Results  Component Value Date   HGBA1C 6.7 (H) 08/18/2021    No results found. ADA Risk Categorization: Low Risk :  Patient has all of the following: Intact protective sensation No prior foot ulcer  No severe deformity Pedal pulses present  High Risk  Patient has one or more of the following: Loss of protective sensation Absent pedal pulses Severe Foot deformity History of foot ulcer  Assessment: No diagnosis found.   Plan: No orders of the defined types were placed in this encounter.   No orders of the defined types were placed in this  encounter.   None  {jgplan:23602::"-Patient/POA to call should there be question/concern in the interim."} No follow-ups on file.  Marzetta Board, DPM

## 2022-10-19 ENCOUNTER — Ambulatory Visit: Payer: Medicare Other | Admitting: Cardiovascular Disease

## 2022-10-29 ENCOUNTER — Other Ambulatory Visit: Payer: Self-pay | Admitting: Cardiovascular Disease

## 2022-11-08 ENCOUNTER — Other Ambulatory Visit: Payer: Self-pay | Admitting: Student

## 2022-11-12 ENCOUNTER — Other Ambulatory Visit: Payer: Self-pay | Admitting: *Deleted

## 2022-11-23 ENCOUNTER — Ambulatory Visit: Payer: Medicare Other | Admitting: General Practice

## 2022-11-24 NOTE — Progress Notes (Deleted)
Cardiology Clinic Note   Patient Name: Pamela [name] Date of Encounter: 11/24/2022  Primary Care Provider:  Reynold Bowen, MD Primary Cardiologist:  Shelva Majestic, MD  Patient Profile    Pamela [name] 77 year old female presents the clinic today for follow-up evaluation of her chest discomfort and hyperlipidemia.  Past Medical History    Past Medical History:  Diagnosis Date   Age-related nuclear cataract of right eye 04/15/2020   Amyloidosis ([place])    organ limited   Arthritis    Cataract    Colon polyps    Diabetes mellitus ([place])    Diverticulosis    Gallstones    GERD (gastroesophageal reflux disease)    Hearing loss    right ear   Heart murmur    Helicobacter pylori gastritis    Hiatal hernia    Hyperlipidemia    Hypertension    Hypothyroidism    Tachycardia    Tubular adenoma of colon    Vitreomacular adhesion of right eye 04/15/2020   Past Surgical History:  Procedure Laterality Date   ABDOMINAL HYSTERECTOMY     x 2   APPENDECTOMY     CHOLECYSTECTOMY     with appendectomy   COLONOSCOPY  12/16/2020   TONSILLECTOMY      Allergies  Allergies  Allergen Reactions   Demerol [Meperidine] Other (See Comments)    Unspecified    Macrobid [Nitrofurantoin Macrocrystal] Other (See Comments)    Unspecified    Macrobid [Nitrofurantoin]     Other reaction(s): hearing loss in R ear   Meclizine Other (See Comments)    Unspecified    Sulfa Antibiotics Other (See Comments)    Unspecified     History of Present Illness    Pamela [name] has a PMH of chest pain with normal coronary anatomy on cardiac catheterization 2010.  She also had negative nuclear stress testing in 2014.  She has history of acute intraparenchymal hemorrhage, HTN, HLD, type 2 diabetes, hypothyroidism, and GERD.  Echocardiogram 2017 showed an LVEF of 60 to 65%, G1 DD, trace-mild MR.  She was admitted to the hospital on 08/17/2021 through 08/18/2021 with small acute intraparenchymal  hemorrhage of the right frontal lobe.  She presented with a sharp nonradiating substernal pain that occurred at rest and resolved on its own.  Her cardiac workup in the ED was unremarkable.  On admission she reported that her chest pain was associated with a frontal headache and numbness into her right extremities.  Head CT showed small focus of subarachnoid and intra parenchymal hemorrhage.  Her brain MRI showed chronic microhemorrhages and a mixed central and peripheral pattern consistent with amyloid angiopathy.  Neurology was consulted and there was strong suspicion for amyloid angiopathy versus hypertensive micro angiopathic disease.  Her aspirin was held for 1 week and then resumed.  Her blood pressure goal is less than 130/90.  She was seen as a posthospital follow-up by Sande Rives, PA-C on 08/24/2021.  During that time she denied recurrent chest pain.  She was stable from a cardiac standpoint.  No ischemic evaluation was ordered due to lack of recurrence of chest discomfort.  An echocardiogram was ordered and showed an LVEF of 60 to 65% with normal wall motion and normal diastolic parameters but no signs concerning for amyloid.  She was seen again in follow-up by Sande Rives, PA-C on 10/12/2021.  She presented with her husband.  She continues to do well from a cardiac standpoint.  She denied recurrent chest  pain.  She denies shortness of breath and orthopnea.  She had no lower extremity edema and denied palpitations.  She underwent head CT 10/05/2021 which showed resolution of her intra parenchymal hemorrhage.  Her blood pressure was well-controlled.  She presents to the clinic today for follow-up evaluation and states*** *** denies chest pain, shortness of breath, lower extremity edema, fatigue, palpitations, melena, hematuria, hemoptysis, diaphoresis, weakness, presyncope, syncope, orthopnea, and PND.  Primary hypertension-BP today***. Maintain heart healthy low-sodium diet Increase  physical activity as tolerated Continue current medical therapy  History of chest pain-denies episodes of arm neck back or chest discomfort.  Denies exertional chest discomfort. Increase physical activity as tolerated Heart healthy low-sodium diet  Hyperlipidemia-LDL***. Heart healthy low-sodium high-fiber diet Continue ezetimibe  Type 2 diabetes-A1c 6.7 on 08/18/2021 Carb modified diet-avoid simple carbohydrates Continue current medical therapy Follows with PCP  Disposition: Follow-up with Dr. Claiborne Billings or me in 12 months.  Home Medications    Prior to Admission medications   Medication Sig Start Date End Date Taking? Authorizing Provider  amLODipine (NORVASC) 5 MG tablet Take 1 tablet (5 mg total) by mouth daily. 11/10/21   Troy Sine, MD  aspirin 81 MG EC tablet 1 tablet 08/28/21   [provider]  calcium carbonate (TUMS EX) 750 MG chewable tablet Chew 1 tablet by mouth as needed for heartburn.    [provider]  carvedilol (COREG) 12.5 MG tablet Take 1 tablet (12.5 mg total) by mouth 2 (two) times daily with a meal. 11/10/21   Troy Sine, MD  cholecalciferol (VITAMIN D) 25 MCG (1000 UNIT) tablet 1,000 Units daily. 01/13/16   [provider]  Coenzyme Q10 (CO Q 10) 100 MG CAPS Take 300 mg by mouth daily.    [provider]  dicyclomine (BENTYL) 20 MG tablet Take 1 tablet (20 mg total) by mouth 4 (four) times daily -  before meals and at bedtime. 02/18/22   Thornton Park, MD  empagliflozin (JARDIANCE) 10 MG TABS tablet Take 5 mg by mouth daily.    [provider]  ezetimibe (ZETIA) 10 MG tablet TAKE 1 TABLET DAILY 10/29/22   Troy Sine, MD  famotidine (PEPCID) 20 MG tablet Take 20 mg by mouth daily as needed for heartburn or indigestion. 10/20/18   [provider]  ferrous sulfate 324 (65 Fe) MG TBEC Take 1 tablet (325 mg total) by mouth every other day. 09/11/21   Thornton Park, MD  glimepiride (AMARYL) 2 MG tablet  Take 2 mg by mouth daily before breakfast.    [provider]  hydrochlorothiazide (HYDRODIURIL) 25 MG tablet TAKE 1 TABLET DAILY 11/08/22   Sande Rives E, PA-C  levothyroxine (SYNTHROID, LEVOTHROID) 137 MCG tablet Take 137 mcg by mouth daily before breakfast. One daily, 1 and 1/2 on Sunday    [provider]  linaGLIPtin-metFORMIN HCl ER 2.01-999 MG TB24 Take 2 tablets by mouth every morning.     [provider]  lisinopril (ZESTRIL) 40 MG tablet TAKE 1 TABLET DAILY 11/08/22   Sande Rives E, PA-C  loperamide (IMODIUM) 2 MG capsule Take 2 capsules (4 mg total) by mouth as needed for diarrhea or loose stools. 06/15/22   Thornton Park, MD  metroNIDAZOLE (METROCREAM) 0.75 % cream Apply 1 application  topically daily as needed (rosacea). 01/16/20   [provider]  pantoprazole (PROTONIX) 40 MG tablet TAKE 1 TABLET TWICE A DAY 07/19/22   Thornton Park, MD  Polyvinyl Alcohol-Povidone (REFRESH OP) Apply 1 drop  to eye as needed.    [provider]    Family History    Family History  Problem Relation Age of Onset   Hypertension Mother        also cerebral hemorrhage   Cerebral aneurysm Mother        also cerebral hemorrhage   Hypertension Father    Diabetes Father        also Alzhemiers   Alzheimer's disease Father    Breast cancer Paternal Grandmother    CAD Maternal Grandfather        Hardening of the arteries   Cerebral aneurysm Maternal Grandfather    Hypertension Sister    Anxiety disorder Sister    Hypertension Son    Colon cancer Neg Hx    Esophageal cancer Neg Hx    Stomach cancer Neg Hx    Rectal cancer Neg Hx    Colon polyps Neg Hx    She indicated that her mother is deceased. She indicated that her father is deceased. She indicated that her sister is deceased. She indicated that her maternal grandmother is deceased. She indicated that her maternal grandfather is deceased. She indicated that her paternal grandmother is  deceased. She indicated that her paternal grandfather is deceased. She indicated that her son is alive. She indicated that the status of her neg hx is unknown.  Social History    Social History   Socioeconomic History   Marital status: Married    Spouse name: Not on file   Number of children: 1   Years of education: Not on file   Highest education level: Not on file  Occupational History   Occupation: retired  Tobacco Use   Smoking status: Never   Smokeless tobacco: Never  Vaping Use   Vaping Use: Never used  Substance and Sexual Activity   Alcohol use: No   Drug use: No   Sexual activity: Not Currently    Partners: Male    Comment: intercourse- 28, partners-1, Married- 70 yrs  Other Topics Concern   Not on file  Social History Narrative   Not on file   Social Determinants of Health   Financial Resource Strain: Not on file  Food Insecurity: Not on file  Transportation Needs: Not on file  Physical Activity: Not on file  Stress: Not on file  Social Connections: Not on file  Intimate Partner Violence: Not on file     Review of Systems    General:  No chills, fever, night sweats or weight changes.  Cardiovascular:  No chest pain, dyspnea on exertion, edema, orthopnea, palpitations, paroxysmal nocturnal dyspnea. Dermatological: No rash, lesions/masses Respiratory: No cough, dyspnea Urologic: No hematuria, dysuria Abdominal:   No nausea, vomiting, diarrhea, bright red blood per rectum, melena, or hematemesis Neurologic:  No visual changes, wkns, changes in mental status. All other systems reviewed and are otherwise negative except as noted above.  Physical Exam    VS:  There were no vitals taken for this visit. , BMI There is no height or weight on file to calculate BMI. GEN: Well nourished, well developed, in no acute distress. HEENT: normal. Neck: Supple, no JVD, carotid bruits, or masses. Cardiac: RRR, no murmurs, rubs, or gallops. No clubbing, cyanosis, edema.   Radials/DP/PT 2+ and equal bilaterally.  Respiratory:  Respirations regular and unlabored, clear to auscultation bilaterally. GI: Soft, nontender, nondistended, BS + x 4. MS: no deformity or atrophy. Skin: warm and dry, no rash. Neuro:  Strength and sensation are  intact. Psych: Normal affect.  Accessory Clinical Findings    Recent Labs: 03/11/2022: ALT 10; BUN 19; Creatinine 1.02; Potassium 4.2; Sodium 140 06/10/2022: Hemoglobin 11.7; Platelet Count 253   Recent Lipid Panel    Component Value Date/Time   CHOL 164 08/18/2021 0234   CHOL 177 03/01/2017 1024   CHOL 140 04/17/2013 0856   TRIG 79 08/18/2021 0234   TRIG 159 (H) 04/17/2013 0856   HDL 59 08/18/2021 0234   HDL 62 03/01/2017 1024   HDL 46 04/17/2013 0856   CHOLHDL 2.8 08/18/2021 0234   VLDL 16 08/18/2021 0234   LDLCALC 89 08/18/2021 0234   LDLCALC 89 03/01/2017 1024   LDLCALC 62 04/17/2013 0856    No BP recorded.  {Refresh Note OR Click here to enter BP  :1}***    ECG personally reviewed by me today- *** - No acute changes  Echocardiogram 09/10/2021  IMPRESSIONS     1. Left ventricular ejection fraction, by estimation, is 60 to 65%. The  left ventricle has normal function. The left ventricle has no regional  wall motion abnormalities. Left ventricular diastolic parameters were  normal. The average left ventricular  global longitudinal strain is -21.0 %. The global longitudinal strain is  normal.   2. Right ventricular systolic function is normal. The right ventricular  size is normal. Tricuspid regurgitation signal is inadequate for assessing  PA pressure.   3. The mitral valve is grossly normal. No evidence of mitral valve  regurgitation. No evidence of mitral stenosis.   4. The aortic valve is tricuspid. Aortic valve regurgitation is not  visualized. No aortic stenosis is present.   5. The inferior vena cava is normal in size with greater than 50%  respiratory variability, suggesting right atrial  pressure of 3 mmHg.   FINDINGS   Left Ventricle: Left ventricular ejection fraction, by estimation, is 60  to 65%. The left ventricle has normal function. The left ventricle has no  regional wall motion abnormalities. The average left ventricular global  longitudinal strain is -21.0 %.  The global longitudinal strain is normal. The left ventricular internal  cavity size was normal in size. There is no left ventricular hypertrophy.  Left ventricular diastolic parameters were normal.   Right Ventricle: The right ventricular size is normal. No increase in  right ventricular wall thickness. Right ventricular systolic function is  normal. Tricuspid regurgitation signal is inadequate for assessing PA  pressure.   Left Atrium: Left atrial size was normal in size.   Right Atrium: Right atrial size was normal in size.   Pericardium: There is no evidence of pericardial effusion. Presence of  epicardial fat layer.   Mitral Valve: The mitral valve is grossly normal. Mild to moderate mitral  annular calcification. No evidence of mitral valve regurgitation. No  evidence of mitral valve stenosis.   Tricuspid Valve: The tricuspid valve is grossly normal. Tricuspid valve  regurgitation is trivial. No evidence of tricuspid stenosis.   Aortic Valve: The aortic valve is tricuspid. Aortic valve regurgitation is  not visualized. No aortic stenosis is present.   Pulmonic Valve: The pulmonic valve was grossly normal. Pulmonic valve  regurgitation is not visualized. No evidence of pulmonic stenosis.   Aorta: The aortic root and ascending aorta are structurally normal, with  no evidence of dilitation.   Venous: The inferior vena cava is normal in size with greater than 50%  respiratory variability, suggesting right atrial pressure of 3 mmHg.   IAS/Shunts: The atrial septum is grossly  normal.     Assessment & Plan   1.  ***   Jossie Ng. Mozell Haber NP-C     11/24/2022, 7:23 AM [address] Office [phone] Fax [phone]    I spent***minutes examining this patient, reviewing medications, and using patient centered shared decision making involving her cardiac care.  Prior to her visit I spent greater than 20 minutes reviewing her past medical history,  medications, and prior cardiac tests.

## 2022-11-26 ENCOUNTER — Ambulatory Visit: Payer: Medicare Other | Admitting: General Practice

## 2022-11-29 ENCOUNTER — Other Ambulatory Visit: Payer: Self-pay | Admitting: Cardiovascular Disease

## 2022-11-29 ENCOUNTER — Ambulatory Visit: Payer: Medicare Other | Admitting: Podiatry

## 2022-11-30 DIAGNOSIS — N1831 Chronic kidney disease, stage 3a: Secondary | ICD-10-CM | POA: Diagnosis not present

## 2022-11-30 DIAGNOSIS — E039 Hypothyroidism, unspecified: Secondary | ICD-10-CM | POA: Diagnosis not present

## 2022-11-30 DIAGNOSIS — K219 Gastro-esophageal reflux disease without esophagitis: Secondary | ICD-10-CM | POA: Diagnosis not present

## 2022-11-30 DIAGNOSIS — K529 Noninfective gastroenteritis and colitis, unspecified: Secondary | ICD-10-CM | POA: Diagnosis not present

## 2022-11-30 DIAGNOSIS — E785 Hyperlipidemia, unspecified: Secondary | ICD-10-CM | POA: Diagnosis not present

## 2022-11-30 DIAGNOSIS — D126 Benign neoplasm of colon, unspecified: Secondary | ICD-10-CM | POA: Diagnosis not present

## 2022-11-30 DIAGNOSIS — I129 Hypertensive chronic kidney disease with stage 1 through stage 4 chronic kidney disease, or unspecified chronic kidney disease: Secondary | ICD-10-CM | POA: Diagnosis not present

## 2022-11-30 DIAGNOSIS — E669 Obesity, unspecified: Secondary | ICD-10-CM | POA: Diagnosis not present

## 2022-11-30 DIAGNOSIS — E1142 Type 2 diabetes mellitus with diabetic polyneuropathy: Secondary | ICD-10-CM | POA: Diagnosis not present

## 2022-11-30 DIAGNOSIS — I700 Atherosclerosis of aorta: Secondary | ICD-10-CM | POA: Diagnosis not present

## 2022-11-30 DIAGNOSIS — D649 Anemia, unspecified: Secondary | ICD-10-CM | POA: Diagnosis not present

## 2022-12-30 ENCOUNTER — Other Ambulatory Visit: Payer: Self-pay | Admitting: Gastroenterology

## 2022-12-30 NOTE — Assessment & Plan Note (Addendum)
Patient without lipid panel since 2022. Historically intolerant to statins. Will repeat fasting lipid panel. Continue Zetia  daily and CoQ10.

## 2022-12-30 NOTE — Progress Notes (Signed)
Cardiology Office Note:    Date:  12/31/2022   ID:  Pamela [name], DOB [birth year], MRN [id number]  PCP:  Adrian Prince, MD  [facility] Providers Cardiologist:  Nicki Guadalajara, MD    Referring MD: Adrian Prince, MD   Chief Complaint:  Follow-up    Patient Profile:  Chest pain Remote cardiac cath in 2010 showed normal coronaries. Myoview in 2014 showed no evidence of ischemia  Primary hypertension Hyperlipidemia DM type II Intraparenchymal hemorrhage  Cardiac Studies & Procedures       ECHOCARDIOGRAM  ECHOCARDIOGRAM COMPLETE 09/10/2021  Narrative ECHOCARDIOGRAM REPORT    Patient Name:   Pamela [name] Date of Exam: 09/10/2021 Medical Rec #:  [id number]      Height:       65.0 in Accession #:    [id number]     Weight:       205.0 lb Date of Birth:  07-17-1946      BSA:          1.999 m Patient Age:    75 years       BP:           128/86 mmHg Patient Gender: F              HR:           62 bpm. Exam Location:  [address]  Procedure: 2D Echo and Strain Analysis  Indications:    Chest Pain R07.9  History:        Patient has prior history of Echocardiogram examinations, most recent 03/30/2016. Risk Factors:Hypertension, Dyslipidemia and Diabetes.  Sonographer:    Thurman Coyer RDCS Referring Phys: [id number] CALLIE E GOODRICH  IMPRESSIONS   1. Left ventricular ejection fraction, by estimation, is 60 to 65%. The left ventricle has normal function. The left ventricle has no regional wall motion abnormalities. Left ventricular diastolic parameters were normal. The average left ventricular global longitudinal strain is -21.0 %. The global longitudinal strain is normal. 2. Right ventricular systolic function is normal. The right ventricular size is normal. Tricuspid regurgitation signal is inadequate for assessing PA pressure. 3. The mitral valve is grossly normal. No evidence of mitral valve regurgitation. No evidence of mitral stenosis. 4. The aortic  valve is tricuspid. Aortic valve regurgitation is not visualized. No aortic stenosis is present. 5. The inferior vena cava is normal in size with greater than 50% respiratory variability, suggesting right atrial pressure of 3 mmHg.  FINDINGS Left Ventricle: Left ventricular ejection fraction, by estimation, is 60 to 65%. The left ventricle has normal function. The left ventricle has no regional wall motion abnormalities. The average left ventricular global longitudinal strain is -21.0 %. The global longitudinal strain is normal. The left ventricular internal cavity size was normal in size. There is no left ventricular hypertrophy. Left ventricular diastolic parameters were normal.  Right Ventricle: The right ventricular size is normal. No increase in right ventricular wall thickness. Right ventricular systolic function is normal. Tricuspid regurgitation signal is inadequate for assessing PA pressure.  Left Atrium: Left atrial size was normal in size.  Right Atrium: Right atrial size was normal in size.  Pericardium: There is no evidence of pericardial effusion. Presence of epicardial fat layer.  Mitral Valve: The mitral valve is grossly normal. Mild to moderate mitral annular calcification. No evidence of mitral valve regurgitation. No evidence of mitral valve stenosis.  Tricuspid Valve: The tricuspid valve is grossly normal. Tricuspid valve regurgitation is trivial. No evidence of tricuspid stenosis.  Aortic Valve: The aortic valve is tricuspid. Aortic valve regurgitation is not visualized. No aortic stenosis is present.  Pulmonic Valve: The pulmonic valve was grossly normal. Pulmonic valve regurgitation is not visualized. No evidence of pulmonic stenosis.  Aorta: The aortic root and ascending aorta are structurally normal, with no evidence of dilitation.  Venous: The inferior vena cava is normal in size with greater than 50% respiratory variability, suggesting right atrial pressure of 3  mmHg.  IAS/Shunts: The atrial septum is grossly normal.   LEFT VENTRICLE PLAX 2D LVIDd:         4.60 cm   Diastology LVIDs:         3.00 cm   LV e' medial:    9.25 cm/s LV PW:         0.80 cm   LV E/e' medial:  9.8 LV IVS:        1.10 cm   LV e' lateral:   12.80 cm/s LVOT diam:     2.20 cm   LV E/e' lateral: 7.1 LV SV:         97 LV SV Index:   49        2D Longitudinal Strain LVOT Area:     3.80 cm  2D Strain GLS Avg:     -21.0 %   RIGHT VENTRICLE RV S prime:     14.10 cm/s TAPSE (M-mode): 2.2 cm  LEFT ATRIUM             Index        RIGHT ATRIUM          Index LA diam:        3.70 cm 1.85 cm/m   RA Area:     9.63 cm LA Vol (A2C):   52.6 ml 26.31 ml/m  RA Volume:   17.20 ml 8.60 ml/m LA Vol (A4C):   45.3 ml 22.66 ml/m LA Biplane Vol: 53.0 ml 26.51 ml/m AORTIC VALVE LVOT Vmax:   102.00 cm/s LVOT Vmean:  67.600 cm/s LVOT VTI:    0.256 m  AORTA Ao Root diam: 3.10 cm  MITRAL VALVE MV Area (PHT): 2.60 cm     SHUNTS MV Decel Time: 292 msec     Systemic VTI:  0.26 m MV E velocity: 90.70 cm/s   Systemic Diam: 2.20 cm MV A velocity: 114.00 cm/s MV E/A ratio:  0.80  Lennie Odor MD Electronically signed by Lennie Odor MD Signature Date/Time: 09/10/2021/5:24:54 PM    Final              History of Present Illness:   Pamela [name] is a 77 y.o. female with the above problem list.  She presents today for routine cardiology follow up with her husband of 58 years. She reports no cardiac concerns since her last office visit. She has been quite busy caring for her husband who was just discharged after a prolonged hospitalization. Denies chest pain, changes to exertional tolerance (functionally limited by orthopedic issues), exertional shortness of breath, PND, orthopnea, palpitations, lower extremity edema, lightheadedness/dizziness, headaches. Patient continues to worry about the number of medications she takes but is happy that her health has been stable. Discussed  repeating lipid panel since her last check was in 2022. Patient agreeable. Today her only health complaint is intermittent exacerbation of acid reflux symptoms.       Past Medical History:  Diagnosis Date   Age-related nuclear cataract of right eye 04/15/2020   Amyloidosis    organ  limited   Arthritis    Cataract    Colon polyps    Diabetes mellitus    Diverticulosis    Gallstones    GERD (gastroesophageal reflux disease)    Hearing loss    right ear   Heart murmur    Helicobacter pylori gastritis    Hiatal hernia    Hyperlipidemia    Hypertension    Hypothyroidism    Tachycardia    Tubular adenoma of colon    Vitreomacular adhesion of right eye 04/15/2020   Current Medications: Current Meds  Medication Sig   amLODipine (NORVASC) 5 MG tablet TAKE 1 TABLET DAILY   aspirin 81 MG EC tablet 1 tablet   calcium carbonate (TUMS EX) 750 MG chewable tablet Chew 1 tablet by mouth as needed for heartburn.   carvedilol (COREG) 12.5 MG tablet Take 1 tablet (12.5 mg total) by mouth 2 (two) times daily with a meal.   cholecalciferol (VITAMIN D) 25 MCG (1000 UNIT) tablet 1,000 Units daily.   Coenzyme Q10 (CO Q 10) 100 MG CAPS Take 300 mg by mouth daily.   dicyclomine (BENTYL) 20 MG tablet TAKE 1 TABLET FOUR TIMES A DAY BEFORE MEALS AND AT BEDTIME   empagliflozin (JARDIANCE) 10 MG TABS tablet Take 5 mg by mouth daily.   ezetimibe (ZETIA) 10 MG tablet TAKE 1 TABLET DAILY   famotidine (PEPCID) 20 MG tablet Take 20 mg by mouth daily as needed for heartburn or indigestion.   ferrous sulfate 324 (65 Fe) MG TBEC Take 1 tablet (325 mg total) by mouth every other day.   glimepiride (AMARYL) 2 MG tablet Take 2 mg by mouth daily before breakfast.   hydrochlorothiazide (HYDRODIURIL) 25 MG tablet TAKE 1 TABLET DAILY   levothyroxine (SYNTHROID, LEVOTHROID) 137 MCG tablet Take 137 mcg by mouth daily before breakfast. One daily, 1 and 1/2 on Sunday   linaGLIPtin-metFORMIN HCl ER 2.01-999 MG TB24 Take 2  tablets by mouth every morning.    lisinopril (ZESTRIL) 40 MG tablet TAKE 1 TABLET DAILY   loperamide (IMODIUM) 2 MG capsule Take 2 capsules (4 mg total) by mouth as needed for diarrhea or loose stools.   metroNIDAZOLE (METROCREAM) 0.75 % cream Apply 1 application  topically daily as needed (rosacea).   pantoprazole (PROTONIX) 40 MG tablet TAKE 1 TABLET TWICE A DAY   Polyvinyl Alcohol-Povidone (REFRESH OP) Apply 1 drop to eye as needed.    Allergies:   Demerol [meperidine], Macrobid [nitrofurantoin macrocrystal], Macrobid [nitrofurantoin], Meclizine, and Sulfa antibiotics   Social History   Occupational History   Occupation: retired  Tobacco Use   Smoking status: Never   Smokeless tobacco: Never  Vaping Use   Vaping Use: Never used  Substance and Sexual Activity   Alcohol use: No   Drug use: No   Sexual activity: Not Currently    Partners: Male    Comment: intercourse- 11, partners-1, Married- 55 yrs    Family Hx: The patient's family history includes Alzheimer's disease in her father; Anxiety disorder in her sister; Breast cancer in her paternal grandmother; CAD in her maternal grandfather; Cerebral aneurysm in her maternal grandfather and mother; Diabetes in her father; Hypertension in her father, mother, sister, and son. There is no history of Colon cancer, Esophageal cancer, Stomach cancer, Rectal cancer, or Colon polyps.  Review of Systems  Constitutional: Negative for weight gain and weight loss.  Cardiovascular:  Negative for chest pain, dyspnea on exertion, irregular heartbeat, leg swelling, orthopnea, palpitations and syncope.  Respiratory:  Negative for cough, shortness of breath and snoring.   All other systems reviewed and are negative.    EKGs/Labs/Other Test Reviewed:    EKG:  EKG is ordered today.  The ekg ordered today demonstrates sinus bradycardia with no focal ischemic changes.   Recent Labs: 03/11/2022: ALT 10; BUN 19; Creatinine 1.02; Potassium 4.2; Sodium  140 06/10/2022: Hemoglobin 11.7; Platelet Count 253   Recent Lipid Panel No results for input(s): "CHOL", "TRIG", "HDL", "VLDL", "LDLCALC", "LDLDIRECT" in the last 8760 hours.    Risk Assessment/Calculations/Metrics:              Physical Exam:    VS:  BP 118/60 (BP Location: Left Arm, Patient Position: Sitting, Cuff Size: Normal)   Pulse (!) 58   Ht 5\' 8"  (1.727 m)   Wt 202 lb 6.4 oz (91.8 kg)   SpO2 95%   BMI 30.77 kg/m     Wt Readings from Last 3 Encounters:  12/31/22 202 lb 6.4 oz (91.8 kg)  07/09/22 204 lb (92.5 kg)  06/15/22 204 lb 6 oz (92.7 kg)    Vitals reviewed.  Constitutional:      Appearance: Healthy appearance.  Eyes:     Pupils: Pupils are equal, round, and reactive to light.  Pulmonary:     Effort: Pulmonary effort is normal.  Cardiovascular:     PMI at left midclavicular line. Normal rate. Regular rhythm. Normal S1. Normal S2.      Murmurs: There is no murmur.     No gallop.  No click. No rub.  Pulses:    Intact distal pulses.  Edema:    Peripheral edema absent.  Abdominal:     General: Bowel sounds are normal.     Palpations: Abdomen is soft.  Musculoskeletal: Normal range of motion. Skin:    General: Skin is warm and dry.  Neurological:     Mental Status: Alert and oriented to person, place and time.         ASSESSMENT & PLAN:   Hypertension BP is excellent today. Continue current medications: Amlodipine 5mg  daily, Coreg 12.5mg  twice daily, HCTZ 25mg  daily, and Lisinopril 40mg  daily.   Hyperlipidemia LDL goal <100 Patient without lipid panel since 2022. Historically intolerant to statins. Will repeat fasting lipid panel. Continue Zetia 10mg  daily and CoQ10.  Gastroesophageal reflux disease without esophagitis Patient should follow up with PCP for further evaluation of GERD if she feels that symptoms are worsening.  Diabetes mellitus without complication (HCC) Continue follow up with PCP Lab Results  Component Value Date   HGBA1C 6.7  (H) 08/18/2021     Atypical chest pain Patient denies any anginal symptoms, exertional dyspnea in the last year. Will continue to monitor for new/progressing symptoms.            Dispo:  1 year follow up   Medication Adjustments/Labs and Tests Ordered: Current medicines are reviewed at length with the patient today.  Concerns regarding medicines are outlined above.  Tests Ordered: Orders Placed This Encounter  Procedures   Lipid panel   Basic Metabolic Panel (BMET)   EKG 12-Lead   Medication Changes: No orders of the defined types were placed in this encounter.  Con Memos, PA-C  12/31/2022 4:24 PM    [facility] [address] [place], [place], [place]  [address] Phone: [phone]; Fax: [phone]

## 2022-12-30 NOTE — Assessment & Plan Note (Addendum)
BP is excellent today. Continue current medications: Amlodipine  daily, Coreg 12.5mg  twice daily, HCTZ  daily, and Lisinopril  daily.

## 2022-12-31 ENCOUNTER — Ambulatory Visit: Payer: Medicare Other | Attending: Cardiovascular Disease | Admitting: Cardiology

## 2022-12-31 ENCOUNTER — Encounter: Payer: Self-pay | Admitting: General Practice

## 2022-12-31 VITALS — BP 118/60 | HR 58 | Ht 68.0 in | Wt 202.4 lb

## 2022-12-31 DIAGNOSIS — R0789 Other chest pain: Secondary | ICD-10-CM | POA: Diagnosis not present

## 2022-12-31 DIAGNOSIS — E119 Type 2 diabetes mellitus without complications: Secondary | ICD-10-CM

## 2022-12-31 DIAGNOSIS — K219 Gastro-esophageal reflux disease without esophagitis: Secondary | ICD-10-CM

## 2022-12-31 DIAGNOSIS — E785 Hyperlipidemia, unspecified: Secondary | ICD-10-CM | POA: Diagnosis not present

## 2022-12-31 DIAGNOSIS — I10 Essential (primary) hypertension: Secondary | ICD-10-CM

## 2022-12-31 NOTE — Assessment & Plan Note (Signed)
Continue follow up with PCP Lab Results  Component Value Date   HGBA1C 6.7 (H) 08/18/2021

## 2022-12-31 NOTE — Assessment & Plan Note (Signed)
Patient denies any anginal symptoms, exertional dyspnea in the last year. Will continue to monitor for new/progressing symptoms.

## 2022-12-31 NOTE — Patient Instructions (Signed)
Lab Work: Your physician recommends that you return for lab work FASTING  If you have labs (blood work) drawn today and your tests are completely normal, you will receive your results only by: MyChart Message (if you have MyChart) OR A paper copy in the mail If you have any lab test that is abnormal or we need to change your treatment, we will call you to review the results.   Follow-Up: At [facility], you and your health needs are our priority.  As part of our continuing mission to provide you with exceptional heart care, we have created designated Provider Care Teams.  These Care Teams include your primary Cardiologist (physician) and Advanced Practice Providers (APPs -  Physician Assistants and Nurse Practitioners) who all work together to provide you with the care you need, when you need it.  We recommend signing up for the patient portal called "MyChart".  Sign up information is provided on this After Visit Summary.  MyChart is used to connect with patients for Virtual Visits (Telemedicine).  Patients are able to view lab/test results, encounter notes, upcoming appointments, etc.  Non-urgent messages can be sent to your provider as well.   To learn more about what you can do with MyChart, go to [email or web address].    Your next appointment:   12 month(s)  Provider:   Nicki Guadalajara, MD

## 2022-12-31 NOTE — Assessment & Plan Note (Signed)
Patient should follow up with PCP for further evaluation of GERD if she feels that symptoms are worsening.

## 2023-01-03 ENCOUNTER — Encounter: Payer: Self-pay | Admitting: Podiatry

## 2023-01-03 ENCOUNTER — Ambulatory Visit (INDEPENDENT_AMBULATORY_CARE_PROVIDER_SITE_OTHER): Payer: Medicare Other | Admitting: Podiatry

## 2023-01-03 VITALS — BP 123/50

## 2023-01-03 DIAGNOSIS — B351 Tinea unguium: Secondary | ICD-10-CM

## 2023-01-03 DIAGNOSIS — E119 Type 2 diabetes mellitus without complications: Secondary | ICD-10-CM

## 2023-01-03 DIAGNOSIS — M79675 Pain in left toe(s): Secondary | ICD-10-CM | POA: Diagnosis not present

## 2023-01-03 DIAGNOSIS — M79674 Pain in right toe(s): Secondary | ICD-10-CM | POA: Diagnosis not present

## 2023-01-03 NOTE — Progress Notes (Unsigned)
  Subjective:  Patient ID: Pamela [name], female    DOB: 04/08/1946,  MRN: [id number]  Pamela [name] presents to clinic today for {jgcomplaint:23593}  Chief Complaint  Patient presents with   Nail Problem   New problem(s): None. {jgcomplaint:23593}  PCP is Adrian Prince, MD.  Allergies  Allergen Reactions   Demerol [Meperidine] Other (See Comments)    Unspecified    Macrobid [Nitrofurantoin Macrocrystal] Other (See Comments)    Unspecified    Macrobid [Nitrofurantoin]     Other reaction(s): hearing loss in R ear   Meclizine Other (See Comments)    Unspecified    Sulfa Antibiotics Other (See Comments)    Unspecified     Review of Systems: Negative except as noted in the HPI.  Objective: No changes noted in today's physical examination. There were no vitals filed for this visit. Pamela [name] is a pleasant 77 y.o. female {jgbodyhabitus:24098} AAO x 3.  Vascular Examination: Capillary refill time immediate b/l. Vascular status intact b/l with palpable pedal pulses. Pedal hair present b/l. No edema. No pain with calf compression b/l. Skin temperature gradient WNL b/l. No cyanosis or clubbing noted b/l LE. Raised venous nodule noted dorsal aspect right great toe along EHL tendon. Has blue hue, is nonpulsatile and asymptomatic.  Neurological Examination: Sensation grossly intact b/l with 10 gram monofilament. Vibratory sensation intact b/l.   Dermatological Examination: Pedal skin with normal turgor, texture and tone b/l.  No open wounds. No interdigital macerations.   Toenails 1-5 b/l thick, discolored, elongated with subungual debris and pain on dorsal palpation.   No hyperkeratotic nor porokeratotic lesions present on today's visit.  Musculoskeletal Examination: Muscle strength 5/5 to all lower extremity muscle groups bilaterally. No pain, crepitus or joint limitation noted with ROM bilateral LE. HAV with bunion deformity noted b/l LE. Pes planus deformity noted  bilateral LE. Utilizes cane for ambulation assistance.  Radiographs: None  Assessment/Plan: 1. Pain due to onychomycosis of toenails of both feet   2. Diabetes mellitus without complication     No orders of the defined types were placed in this encounter.   None {Jgplan:23602::"-Patient/POA to call should there be question/concern in the interim."}   No follow-ups on file.  Freddie Breech, DPM

## 2023-01-10 DIAGNOSIS — E669 Obesity, unspecified: Secondary | ICD-10-CM | POA: Diagnosis not present

## 2023-01-10 DIAGNOSIS — K219 Gastro-esophageal reflux disease without esophagitis: Secondary | ICD-10-CM | POA: Diagnosis not present

## 2023-01-10 DIAGNOSIS — H43821 Vitreomacular adhesion, right eye: Secondary | ICD-10-CM | POA: Diagnosis not present

## 2023-01-10 DIAGNOSIS — E559 Vitamin D deficiency, unspecified: Secondary | ICD-10-CM | POA: Diagnosis not present

## 2023-01-10 DIAGNOSIS — H8110 Benign paroxysmal vertigo, unspecified ear: Secondary | ICD-10-CM | POA: Diagnosis not present

## 2023-01-10 DIAGNOSIS — L84 Corns and callosities: Secondary | ICD-10-CM | POA: Diagnosis not present

## 2023-01-10 DIAGNOSIS — H9190 Unspecified hearing loss, unspecified ear: Secondary | ICD-10-CM | POA: Diagnosis not present

## 2023-01-10 DIAGNOSIS — I700 Atherosclerosis of aorta: Secondary | ICD-10-CM | POA: Diagnosis not present

## 2023-01-10 DIAGNOSIS — E871 Hypo-osmolality and hyponatremia: Secondary | ICD-10-CM | POA: Diagnosis not present

## 2023-01-10 DIAGNOSIS — E039 Hypothyroidism, unspecified: Secondary | ICD-10-CM | POA: Diagnosis not present

## 2023-01-10 DIAGNOSIS — F411 Generalized anxiety disorder: Secondary | ICD-10-CM | POA: Diagnosis not present

## 2023-01-10 DIAGNOSIS — D126 Benign neoplasm of colon, unspecified: Secondary | ICD-10-CM | POA: Diagnosis not present

## 2023-01-10 DIAGNOSIS — I680 Cerebral amyloid angiopathy: Secondary | ICD-10-CM | POA: Diagnosis not present

## 2023-01-10 DIAGNOSIS — K5790 Diverticulosis of intestine, part unspecified, without perforation or abscess without bleeding: Secondary | ICD-10-CM | POA: Diagnosis not present

## 2023-01-10 DIAGNOSIS — I1310 Hypertensive heart and chronic kidney disease without heart failure, with stage 1 through stage 4 chronic kidney disease, or unspecified chronic kidney disease: Secondary | ICD-10-CM | POA: Diagnosis not present

## 2023-01-10 DIAGNOSIS — D509 Iron deficiency anemia, unspecified: Secondary | ICD-10-CM | POA: Diagnosis not present

## 2023-01-10 DIAGNOSIS — E785 Hyperlipidemia, unspecified: Secondary | ICD-10-CM | POA: Diagnosis not present

## 2023-01-10 DIAGNOSIS — N1831 Chronic kidney disease, stage 3a: Secondary | ICD-10-CM | POA: Diagnosis not present

## 2023-01-10 DIAGNOSIS — E1142 Type 2 diabetes mellitus with diabetic polyneuropathy: Secondary | ICD-10-CM | POA: Diagnosis not present

## 2023-01-10 DIAGNOSIS — E1122 Type 2 diabetes mellitus with diabetic chronic kidney disease: Secondary | ICD-10-CM | POA: Diagnosis not present

## 2023-01-10 DIAGNOSIS — K449 Diaphragmatic hernia without obstruction or gangrene: Secondary | ICD-10-CM | POA: Diagnosis not present

## 2023-01-10 DIAGNOSIS — K591 Functional diarrhea: Secondary | ICD-10-CM | POA: Diagnosis not present

## 2023-01-10 DIAGNOSIS — D631 Anemia in chronic kidney disease: Secondary | ICD-10-CM | POA: Diagnosis not present

## 2023-01-10 DIAGNOSIS — Z6830 Body mass index (BMI) 30.0-30.9, adult: Secondary | ICD-10-CM | POA: Diagnosis not present

## 2023-01-10 DIAGNOSIS — M25561 Pain in right knee: Secondary | ICD-10-CM | POA: Diagnosis not present

## 2023-01-11 ENCOUNTER — Telehealth: Payer: Self-pay | Admitting: Cardiovascular Disease

## 2023-01-11 ENCOUNTER — Other Ambulatory Visit: Payer: Self-pay | Admitting: Cardiovascular Disease

## 2023-01-11 MED ORDER — CARVEDILOL 12.5 MG PO TABS: 12.5000 mg | ORAL_TABLET | Freq: Two times a day (BID) | ORAL | 0 refills | Status: DC

## 2023-01-11 NOTE — Telephone Encounter (Signed)
*  STAT* If patient is at the pharmacy, call can be transferred to refill team.   1. Which medications need to be refilled? (please list name of each medication and dose if known) amLODipine (NORVASC) 5 MG tablet carvedilol (COREG) 12.5 MG tablet  2. Which pharmacy/location (including street and city if local pharmacy) is medication to be sent to?  [facility] - [place], [place] - [address]    And needs a few days worth of Carvedilol sent to:  [id number] - [place], [place] - [address] AT [facility] OF [address]    3. Do they need a 30 day or 90 day supply? 90

## 2023-01-13 DIAGNOSIS — I680 Cerebral amyloid angiopathy: Secondary | ICD-10-CM | POA: Diagnosis not present

## 2023-01-13 DIAGNOSIS — E1122 Type 2 diabetes mellitus with diabetic chronic kidney disease: Secondary | ICD-10-CM | POA: Diagnosis not present

## 2023-01-13 DIAGNOSIS — N1831 Chronic kidney disease, stage 3a: Secondary | ICD-10-CM | POA: Diagnosis not present

## 2023-01-13 DIAGNOSIS — E1142 Type 2 diabetes mellitus with diabetic polyneuropathy: Secondary | ICD-10-CM | POA: Diagnosis not present

## 2023-01-13 DIAGNOSIS — I1310 Hypertensive heart and chronic kidney disease without heart failure, with stage 1 through stage 4 chronic kidney disease, or unspecified chronic kidney disease: Secondary | ICD-10-CM | POA: Diagnosis not present

## 2023-01-13 DIAGNOSIS — I700 Atherosclerosis of aorta: Secondary | ICD-10-CM | POA: Diagnosis not present

## 2023-01-17 DIAGNOSIS — I1310 Hypertensive heart and chronic kidney disease without heart failure, with stage 1 through stage 4 chronic kidney disease, or unspecified chronic kidney disease: Secondary | ICD-10-CM | POA: Diagnosis not present

## 2023-01-17 DIAGNOSIS — N1831 Chronic kidney disease, stage 3a: Secondary | ICD-10-CM | POA: Diagnosis not present

## 2023-01-17 DIAGNOSIS — E1122 Type 2 diabetes mellitus with diabetic chronic kidney disease: Secondary | ICD-10-CM | POA: Diagnosis not present

## 2023-01-17 DIAGNOSIS — E1142 Type 2 diabetes mellitus with diabetic polyneuropathy: Secondary | ICD-10-CM | POA: Diagnosis not present

## 2023-01-17 DIAGNOSIS — I700 Atherosclerosis of aorta: Secondary | ICD-10-CM | POA: Diagnosis not present

## 2023-01-17 DIAGNOSIS — I680 Cerebral amyloid angiopathy: Secondary | ICD-10-CM | POA: Diagnosis not present

## 2023-01-20 DIAGNOSIS — E1142 Type 2 diabetes mellitus with diabetic polyneuropathy: Secondary | ICD-10-CM | POA: Diagnosis not present

## 2023-01-20 DIAGNOSIS — I680 Cerebral amyloid angiopathy: Secondary | ICD-10-CM | POA: Diagnosis not present

## 2023-01-20 DIAGNOSIS — N1831 Chronic kidney disease, stage 3a: Secondary | ICD-10-CM | POA: Diagnosis not present

## 2023-01-20 DIAGNOSIS — I700 Atherosclerosis of aorta: Secondary | ICD-10-CM | POA: Diagnosis not present

## 2023-01-20 DIAGNOSIS — E1122 Type 2 diabetes mellitus with diabetic chronic kidney disease: Secondary | ICD-10-CM | POA: Diagnosis not present

## 2023-01-20 DIAGNOSIS — I1310 Hypertensive heart and chronic kidney disease without heart failure, with stage 1 through stage 4 chronic kidney disease, or unspecified chronic kidney disease: Secondary | ICD-10-CM | POA: Diagnosis not present

## 2023-01-25 DIAGNOSIS — E1122 Type 2 diabetes mellitus with diabetic chronic kidney disease: Secondary | ICD-10-CM | POA: Diagnosis not present

## 2023-01-25 DIAGNOSIS — I680 Cerebral amyloid angiopathy: Secondary | ICD-10-CM | POA: Diagnosis not present

## 2023-01-25 DIAGNOSIS — N1831 Chronic kidney disease, stage 3a: Secondary | ICD-10-CM | POA: Diagnosis not present

## 2023-01-25 DIAGNOSIS — I700 Atherosclerosis of aorta: Secondary | ICD-10-CM | POA: Diagnosis not present

## 2023-01-25 DIAGNOSIS — I1310 Hypertensive heart and chronic kidney disease without heart failure, with stage 1 through stage 4 chronic kidney disease, or unspecified chronic kidney disease: Secondary | ICD-10-CM | POA: Diagnosis not present

## 2023-01-25 DIAGNOSIS — E1142 Type 2 diabetes mellitus with diabetic polyneuropathy: Secondary | ICD-10-CM | POA: Diagnosis not present

## 2023-01-27 DIAGNOSIS — E1142 Type 2 diabetes mellitus with diabetic polyneuropathy: Secondary | ICD-10-CM | POA: Diagnosis not present

## 2023-01-27 DIAGNOSIS — I700 Atherosclerosis of aorta: Secondary | ICD-10-CM | POA: Diagnosis not present

## 2023-01-27 DIAGNOSIS — E1122 Type 2 diabetes mellitus with diabetic chronic kidney disease: Secondary | ICD-10-CM | POA: Diagnosis not present

## 2023-01-27 DIAGNOSIS — N1831 Chronic kidney disease, stage 3a: Secondary | ICD-10-CM | POA: Diagnosis not present

## 2023-01-27 DIAGNOSIS — I680 Cerebral amyloid angiopathy: Secondary | ICD-10-CM | POA: Diagnosis not present

## 2023-01-27 DIAGNOSIS — I1310 Hypertensive heart and chronic kidney disease without heart failure, with stage 1 through stage 4 chronic kidney disease, or unspecified chronic kidney disease: Secondary | ICD-10-CM | POA: Diagnosis not present

## 2023-01-28 DIAGNOSIS — N1831 Chronic kidney disease, stage 3a: Secondary | ICD-10-CM | POA: Diagnosis not present

## 2023-01-28 DIAGNOSIS — E1122 Type 2 diabetes mellitus with diabetic chronic kidney disease: Secondary | ICD-10-CM | POA: Diagnosis not present

## 2023-01-28 DIAGNOSIS — I680 Cerebral amyloid angiopathy: Secondary | ICD-10-CM | POA: Diagnosis not present

## 2023-01-28 DIAGNOSIS — I700 Atherosclerosis of aorta: Secondary | ICD-10-CM | POA: Diagnosis not present

## 2023-01-28 DIAGNOSIS — I1310 Hypertensive heart and chronic kidney disease without heart failure, with stage 1 through stage 4 chronic kidney disease, or unspecified chronic kidney disease: Secondary | ICD-10-CM | POA: Diagnosis not present

## 2023-01-28 DIAGNOSIS — E1142 Type 2 diabetes mellitus with diabetic polyneuropathy: Secondary | ICD-10-CM | POA: Diagnosis not present

## 2023-01-31 DIAGNOSIS — R413 Other amnesia: Secondary | ICD-10-CM | POA: Diagnosis not present

## 2023-01-31 DIAGNOSIS — H0016 Chalazion left eye, unspecified eyelid: Secondary | ICD-10-CM | POA: Diagnosis not present

## 2023-01-31 DIAGNOSIS — H0013 Chalazion right eye, unspecified eyelid: Secondary | ICD-10-CM | POA: Diagnosis not present

## 2023-02-01 DIAGNOSIS — E1142 Type 2 diabetes mellitus with diabetic polyneuropathy: Secondary | ICD-10-CM | POA: Diagnosis not present

## 2023-02-01 DIAGNOSIS — N1831 Chronic kidney disease, stage 3a: Secondary | ICD-10-CM | POA: Diagnosis not present

## 2023-02-01 DIAGNOSIS — I700 Atherosclerosis of aorta: Secondary | ICD-10-CM | POA: Diagnosis not present

## 2023-02-01 DIAGNOSIS — E1122 Type 2 diabetes mellitus with diabetic chronic kidney disease: Secondary | ICD-10-CM | POA: Diagnosis not present

## 2023-02-01 DIAGNOSIS — I1310 Hypertensive heart and chronic kidney disease without heart failure, with stage 1 through stage 4 chronic kidney disease, or unspecified chronic kidney disease: Secondary | ICD-10-CM | POA: Diagnosis not present

## 2023-02-01 DIAGNOSIS — I680 Cerebral amyloid angiopathy: Secondary | ICD-10-CM | POA: Diagnosis not present

## 2023-02-04 DIAGNOSIS — E1142 Type 2 diabetes mellitus with diabetic polyneuropathy: Secondary | ICD-10-CM | POA: Diagnosis not present

## 2023-02-04 DIAGNOSIS — I680 Cerebral amyloid angiopathy: Secondary | ICD-10-CM | POA: Diagnosis not present

## 2023-02-04 DIAGNOSIS — N1831 Chronic kidney disease, stage 3a: Secondary | ICD-10-CM | POA: Diagnosis not present

## 2023-02-04 DIAGNOSIS — E1122 Type 2 diabetes mellitus with diabetic chronic kidney disease: Secondary | ICD-10-CM | POA: Diagnosis not present

## 2023-02-04 DIAGNOSIS — I700 Atherosclerosis of aorta: Secondary | ICD-10-CM | POA: Diagnosis not present

## 2023-02-04 DIAGNOSIS — I1310 Hypertensive heart and chronic kidney disease without heart failure, with stage 1 through stage 4 chronic kidney disease, or unspecified chronic kidney disease: Secondary | ICD-10-CM | POA: Diagnosis not present

## 2023-02-09 DIAGNOSIS — K449 Diaphragmatic hernia without obstruction or gangrene: Secondary | ICD-10-CM | POA: Diagnosis not present

## 2023-02-09 DIAGNOSIS — K219 Gastro-esophageal reflux disease without esophagitis: Secondary | ICD-10-CM | POA: Diagnosis not present

## 2023-02-09 DIAGNOSIS — E559 Vitamin D deficiency, unspecified: Secondary | ICD-10-CM | POA: Diagnosis not present

## 2023-02-09 DIAGNOSIS — E1142 Type 2 diabetes mellitus with diabetic polyneuropathy: Secondary | ICD-10-CM | POA: Diagnosis not present

## 2023-02-09 DIAGNOSIS — E871 Hypo-osmolality and hyponatremia: Secondary | ICD-10-CM | POA: Diagnosis not present

## 2023-02-09 DIAGNOSIS — E1122 Type 2 diabetes mellitus with diabetic chronic kidney disease: Secondary | ICD-10-CM | POA: Diagnosis not present

## 2023-02-09 DIAGNOSIS — K5790 Diverticulosis of intestine, part unspecified, without perforation or abscess without bleeding: Secondary | ICD-10-CM | POA: Diagnosis not present

## 2023-02-09 DIAGNOSIS — D631 Anemia in chronic kidney disease: Secondary | ICD-10-CM | POA: Diagnosis not present

## 2023-02-09 DIAGNOSIS — I1310 Hypertensive heart and chronic kidney disease without heart failure, with stage 1 through stage 4 chronic kidney disease, or unspecified chronic kidney disease: Secondary | ICD-10-CM | POA: Diagnosis not present

## 2023-02-09 DIAGNOSIS — D509 Iron deficiency anemia, unspecified: Secondary | ICD-10-CM | POA: Diagnosis not present

## 2023-02-09 DIAGNOSIS — L84 Corns and callosities: Secondary | ICD-10-CM | POA: Diagnosis not present

## 2023-02-09 DIAGNOSIS — F411 Generalized anxiety disorder: Secondary | ICD-10-CM | POA: Diagnosis not present

## 2023-02-09 DIAGNOSIS — D126 Benign neoplasm of colon, unspecified: Secondary | ICD-10-CM | POA: Diagnosis not present

## 2023-02-09 DIAGNOSIS — Z6830 Body mass index (BMI) 30.0-30.9, adult: Secondary | ICD-10-CM | POA: Diagnosis not present

## 2023-02-09 DIAGNOSIS — E669 Obesity, unspecified: Secondary | ICD-10-CM | POA: Diagnosis not present

## 2023-02-09 DIAGNOSIS — I700 Atherosclerosis of aorta: Secondary | ICD-10-CM | POA: Diagnosis not present

## 2023-02-09 DIAGNOSIS — I680 Cerebral amyloid angiopathy: Secondary | ICD-10-CM | POA: Diagnosis not present

## 2023-02-09 DIAGNOSIS — N1831 Chronic kidney disease, stage 3a: Secondary | ICD-10-CM | POA: Diagnosis not present

## 2023-02-09 DIAGNOSIS — M25561 Pain in right knee: Secondary | ICD-10-CM | POA: Diagnosis not present

## 2023-02-09 DIAGNOSIS — K591 Functional diarrhea: Secondary | ICD-10-CM | POA: Diagnosis not present

## 2023-02-09 DIAGNOSIS — H9190 Unspecified hearing loss, unspecified ear: Secondary | ICD-10-CM | POA: Diagnosis not present

## 2023-02-09 DIAGNOSIS — E785 Hyperlipidemia, unspecified: Secondary | ICD-10-CM | POA: Diagnosis not present

## 2023-02-09 DIAGNOSIS — H43821 Vitreomacular adhesion, right eye: Secondary | ICD-10-CM | POA: Diagnosis not present

## 2023-02-09 DIAGNOSIS — H8110 Benign paroxysmal vertigo, unspecified ear: Secondary | ICD-10-CM | POA: Diagnosis not present

## 2023-02-09 DIAGNOSIS — E039 Hypothyroidism, unspecified: Secondary | ICD-10-CM | POA: Diagnosis not present

## 2023-02-14 DIAGNOSIS — I700 Atherosclerosis of aorta: Secondary | ICD-10-CM | POA: Diagnosis not present

## 2023-02-14 DIAGNOSIS — E1122 Type 2 diabetes mellitus with diabetic chronic kidney disease: Secondary | ICD-10-CM | POA: Diagnosis not present

## 2023-02-14 DIAGNOSIS — E1142 Type 2 diabetes mellitus with diabetic polyneuropathy: Secondary | ICD-10-CM | POA: Diagnosis not present

## 2023-02-14 DIAGNOSIS — I680 Cerebral amyloid angiopathy: Secondary | ICD-10-CM | POA: Diagnosis not present

## 2023-02-14 DIAGNOSIS — I1310 Hypertensive heart and chronic kidney disease without heart failure, with stage 1 through stage 4 chronic kidney disease, or unspecified chronic kidney disease: Secondary | ICD-10-CM | POA: Diagnosis not present

## 2023-02-14 DIAGNOSIS — N1831 Chronic kidney disease, stage 3a: Secondary | ICD-10-CM | POA: Diagnosis not present

## 2023-02-15 ENCOUNTER — Ambulatory Visit: Payer: Medicare Other | Admitting: Podiatry

## 2023-02-22 DIAGNOSIS — M1711 Unilateral primary osteoarthritis, right knee: Secondary | ICD-10-CM | POA: Diagnosis not present

## 2023-02-23 DIAGNOSIS — I700 Atherosclerosis of aorta: Secondary | ICD-10-CM | POA: Diagnosis not present

## 2023-02-23 DIAGNOSIS — E1142 Type 2 diabetes mellitus with diabetic polyneuropathy: Secondary | ICD-10-CM | POA: Diagnosis not present

## 2023-02-23 DIAGNOSIS — N1831 Chronic kidney disease, stage 3a: Secondary | ICD-10-CM | POA: Diagnosis not present

## 2023-02-23 DIAGNOSIS — I1310 Hypertensive heart and chronic kidney disease without heart failure, with stage 1 through stage 4 chronic kidney disease, or unspecified chronic kidney disease: Secondary | ICD-10-CM | POA: Diagnosis not present

## 2023-02-23 DIAGNOSIS — I680 Cerebral amyloid angiopathy: Secondary | ICD-10-CM | POA: Diagnosis not present

## 2023-02-23 DIAGNOSIS — E1122 Type 2 diabetes mellitus with diabetic chronic kidney disease: Secondary | ICD-10-CM | POA: Diagnosis not present

## 2023-03-01 DIAGNOSIS — N1831 Chronic kidney disease, stage 3a: Secondary | ICD-10-CM | POA: Diagnosis not present

## 2023-03-01 DIAGNOSIS — I680 Cerebral amyloid angiopathy: Secondary | ICD-10-CM | POA: Diagnosis not present

## 2023-03-01 DIAGNOSIS — E1122 Type 2 diabetes mellitus with diabetic chronic kidney disease: Secondary | ICD-10-CM | POA: Diagnosis not present

## 2023-03-01 DIAGNOSIS — I700 Atherosclerosis of aorta: Secondary | ICD-10-CM | POA: Diagnosis not present

## 2023-03-01 DIAGNOSIS — I1310 Hypertensive heart and chronic kidney disease without heart failure, with stage 1 through stage 4 chronic kidney disease, or unspecified chronic kidney disease: Secondary | ICD-10-CM | POA: Diagnosis not present

## 2023-03-01 DIAGNOSIS — E1142 Type 2 diabetes mellitus with diabetic polyneuropathy: Secondary | ICD-10-CM | POA: Diagnosis not present

## 2023-03-04 DIAGNOSIS — I700 Atherosclerosis of aorta: Secondary | ICD-10-CM | POA: Diagnosis not present

## 2023-03-04 DIAGNOSIS — E1142 Type 2 diabetes mellitus with diabetic polyneuropathy: Secondary | ICD-10-CM | POA: Diagnosis not present

## 2023-03-04 DIAGNOSIS — E1122 Type 2 diabetes mellitus with diabetic chronic kidney disease: Secondary | ICD-10-CM | POA: Diagnosis not present

## 2023-03-04 DIAGNOSIS — I680 Cerebral amyloid angiopathy: Secondary | ICD-10-CM | POA: Diagnosis not present

## 2023-03-04 DIAGNOSIS — N1831 Chronic kidney disease, stage 3a: Secondary | ICD-10-CM | POA: Diagnosis not present

## 2023-03-04 DIAGNOSIS — I1310 Hypertensive heart and chronic kidney disease without heart failure, with stage 1 through stage 4 chronic kidney disease, or unspecified chronic kidney disease: Secondary | ICD-10-CM | POA: Diagnosis not present

## 2023-03-14 ENCOUNTER — Ambulatory Visit: Payer: Medicare Other | Admitting: Podiatry

## 2023-03-14 ENCOUNTER — Ambulatory Visit (INDEPENDENT_AMBULATORY_CARE_PROVIDER_SITE_OTHER): Payer: Medicare Other | Admitting: Podiatry

## 2023-03-14 DIAGNOSIS — E119 Type 2 diabetes mellitus without complications: Secondary | ICD-10-CM

## 2023-03-14 DIAGNOSIS — M2041 Other hammer toe(s) (acquired), right foot: Secondary | ICD-10-CM

## 2023-03-14 DIAGNOSIS — M2012 Hallux valgus (acquired), left foot: Secondary | ICD-10-CM

## 2023-03-14 DIAGNOSIS — M2011 Hallux valgus (acquired), right foot: Secondary | ICD-10-CM

## 2023-03-14 DIAGNOSIS — M2042 Other hammer toe(s) (acquired), left foot: Secondary | ICD-10-CM

## 2023-03-14 DIAGNOSIS — M2142 Flat foot [pes planus] (acquired), left foot: Secondary | ICD-10-CM

## 2023-03-14 DIAGNOSIS — M2141 Flat foot [pes planus] (acquired), right foot: Secondary | ICD-10-CM

## 2023-03-14 NOTE — Progress Notes (Unsigned)
Patient presents today to be casted for diabetic shoes and insoles.  Patient was measured for  1 pair of diabetic shoes and 3 pairs of foam casted diabetic insoles.   Ht 5'6 Wt 199  Shoe size 12 mens Shoe type  new balance all black or apex 801  Treating physician is Adrian Prince Re-appointment for regularly scheduled diabetic foot care visits or if they should experience any trouble with the shoes or insoles.   Financial documents signed

## 2023-03-29 DIAGNOSIS — D649 Anemia, unspecified: Secondary | ICD-10-CM | POA: Diagnosis not present

## 2023-03-29 DIAGNOSIS — I680 Cerebral amyloid angiopathy: Secondary | ICD-10-CM | POA: Diagnosis not present

## 2023-03-29 DIAGNOSIS — E039 Hypothyroidism, unspecified: Secondary | ICD-10-CM | POA: Diagnosis not present

## 2023-03-29 DIAGNOSIS — N1831 Chronic kidney disease, stage 3a: Secondary | ICD-10-CM | POA: Diagnosis not present

## 2023-03-29 DIAGNOSIS — E669 Obesity, unspecified: Secondary | ICD-10-CM | POA: Diagnosis not present

## 2023-03-29 DIAGNOSIS — I700 Atherosclerosis of aorta: Secondary | ICD-10-CM | POA: Diagnosis not present

## 2023-03-29 DIAGNOSIS — K219 Gastro-esophageal reflux disease without esophagitis: Secondary | ICD-10-CM | POA: Diagnosis not present

## 2023-03-29 DIAGNOSIS — L84 Corns and callosities: Secondary | ICD-10-CM | POA: Diagnosis not present

## 2023-03-29 DIAGNOSIS — I129 Hypertensive chronic kidney disease with stage 1 through stage 4 chronic kidney disease, or unspecified chronic kidney disease: Secondary | ICD-10-CM | POA: Diagnosis not present

## 2023-03-29 DIAGNOSIS — I5189 Other ill-defined heart diseases: Secondary | ICD-10-CM | POA: Diagnosis not present

## 2023-03-29 DIAGNOSIS — E1142 Type 2 diabetes mellitus with diabetic polyneuropathy: Secondary | ICD-10-CM | POA: Diagnosis not present

## 2023-03-29 DIAGNOSIS — E785 Hyperlipidemia, unspecified: Secondary | ICD-10-CM | POA: Diagnosis not present

## 2023-04-18 DIAGNOSIS — E113293 Type 2 diabetes mellitus with mild nonproliferative diabetic retinopathy without macular edema, bilateral: Secondary | ICD-10-CM | POA: Diagnosis not present

## 2023-04-19 ENCOUNTER — Ambulatory Visit: Payer: Medicare Other | Admitting: Podiatry

## 2023-05-26 ENCOUNTER — Telehealth: Payer: Self-pay

## 2023-05-26 NOTE — Telephone Encounter (Signed)
Received refill request for Pamela [name]'s dicyclomine 20mg  tablets. Will route to Dr Jenel Lucks to advise as Dr of the day, former Dr Orvan Falconer patient.

## 2023-05-27 ENCOUNTER — Other Ambulatory Visit: Payer: Self-pay

## 2023-05-27 MED ORDER — DICYCLOMINE HCL 20 MG PO TABS: 20.0000 mg | ORAL_TABLET | Freq: Four times a day (QID) | ORAL | 3 refills | Status: DC | PRN

## 2023-05-27 MED ORDER — DICYCLOMINE HCL 20 MG PO TABS: 20.0000 mg | ORAL_TABLET | Freq: Three times a day (TID) | ORAL | 3 refills | Status: DC

## 2023-05-27 NOTE — Telephone Encounter (Signed)
Dr Tomasa Rand approved the dicyclomine 20mg  QID prn #120, with 3 RF's. Sent in to [facility].

## 2023-05-27 NOTE — Telephone Encounter (Signed)
I have resent the [facility] to [facility] with the instructions QID prn not QID per Dr Tomasa Rand.

## 2023-05-31 ENCOUNTER — Ambulatory Visit (INDEPENDENT_AMBULATORY_CARE_PROVIDER_SITE_OTHER): Payer: Medicare Other | Admitting: Podiatry

## 2023-05-31 ENCOUNTER — Encounter: Payer: Self-pay | Admitting: Podiatry

## 2023-05-31 DIAGNOSIS — M79675 Pain in left toe(s): Secondary | ICD-10-CM

## 2023-05-31 DIAGNOSIS — M79674 Pain in right toe(s): Secondary | ICD-10-CM

## 2023-05-31 DIAGNOSIS — E1142 Type 2 diabetes mellitus with diabetic polyneuropathy: Secondary | ICD-10-CM

## 2023-05-31 DIAGNOSIS — B351 Tinea unguium: Secondary | ICD-10-CM | POA: Diagnosis not present

## 2023-05-31 DIAGNOSIS — E119 Type 2 diabetes mellitus without complications: Secondary | ICD-10-CM

## 2023-06-05 NOTE — Progress Notes (Signed)
Subjective:  Patient ID: Pamela [name], female    DOB: 09/05/1946,  MRN: [id number]  Pamela [name] presents to clinic today for preventative diabetic foot care, at risk foot care with history of diabetic neuropathy, and painful thick toenails that are difficult to trim. Pain interferes with ambulation. Aggravating factors include wearing enclosed shoe gear. Pain is relieved with periodic professional debridement.  Chief Complaint  Patient presents with   Diabetes    DFC BS - DIDN'T CHECK IT  A1C - 6.1   New problem(s): None.   PCP is Adrian Prince, MD.  Allergies  Allergen Reactions   Demerol [Meperidine] Other (See Comments)    Unspecified    Macrobid [Nitrofurantoin Macrocrystal] Other (See Comments)    Unspecified    Macrobid [Nitrofurantoin]     Other reaction(s): hearing loss in R ear   Meclizine Other (See Comments)    Unspecified    Sulfa Antibiotics Other (See Comments)    Unspecified     Review of Systems: Negative except as noted in the HPI.  Objective: No changes noted in today's physical examination. There were no vitals filed for this visit. Pamela [name] is a pleasant 77 y.o. female in NAD. AAO x 3.  Vascular Examination: Capillary refill time immediate b/l. Vascular status intact b/l with palpable pedal pulses. Pedal hair present b/l. No edema. No pain with calf compression b/l. Skin temperature gradient WNL b/l. No cyanosis or clubbing noted b/l LE.   Raised venous nodule, freely movable noted dorsal aspect right great toe along EHL tendon. Has blue hue, is nonpulsatile and asymptomatic.  Neurological Examination: Sensation grossly intact b/l with 10 gram monofilament. Vibratory sensation intact b/l.   Dermatological Examination: Pedal skin with normal turgor, texture and tone b/l.  No open wounds. No interdigital macerations.   Toenails 1-5 b/l thick, discolored, elongated with subungual debris and pain on dorsal palpation.   No hyperkeratotic  nor porokeratotic lesions present on today's visit.  Musculoskeletal Examination: Muscle strength 5/5 to all lower extremity muscle groups bilaterally. No pain, crepitus or joint limitation noted with ROM bilateral LE. HAV with bunion deformity noted b/l LE. Pes planus deformity noted bilateral LE. Utilizes cane for ambulation assistance.  Radiographs: None  Assessment/Plan: 1. Pain due to onychomycosis of toenails of both feet   2. Polyneuropathy due to type 2 diabetes mellitus (HCC)     -Patient's family member present. All questions/concerns addressed on today's visit. -No new findings. No new orders. -Patient has received her new diabetic shoes. -Continue diabetic shoes daily. -Toenails 1-5 b/l were debrided in length and girth with sterile nail nippers and dremel without iatrogenic bleeding.  -Patient/POA to call should there be question/concern in the interim.   Return in about 3 months (around 08/30/2023).  Freddie Breech, DPM

## 2023-07-04 ENCOUNTER — Telehealth: Payer: Self-pay | Admitting: Podiatry

## 2023-07-04 NOTE — Telephone Encounter (Signed)
Pts husband called checking on diabetic shoes.

## 2023-07-10 ENCOUNTER — Emergency Department (HOSPITAL_BASED_OUTPATIENT_CLINIC_OR_DEPARTMENT_OTHER): Payer: Medicare Other

## 2023-07-10 ENCOUNTER — Emergency Department (HOSPITAL_BASED_OUTPATIENT_CLINIC_OR_DEPARTMENT_OTHER)
Admission: EM | Admit: 2023-07-10 | Discharge: 2023-07-10 | Disposition: A | Discharge status: Home / Self Care | Payer: Medicare Other | Attending: Emergency Medicine | Admitting: Emergency Medicine

## 2023-07-10 ENCOUNTER — Other Ambulatory Visit: Payer: Self-pay

## 2023-07-10 ENCOUNTER — Encounter (HOSPITAL_BASED_OUTPATIENT_CLINIC_OR_DEPARTMENT_OTHER): Payer: Self-pay

## 2023-07-10 DIAGNOSIS — M1711 Unilateral primary osteoarthritis, right knee: Secondary | ICD-10-CM | POA: Diagnosis not present

## 2023-07-10 DIAGNOSIS — S3992XA Unspecified injury of lower back, initial encounter: Secondary | ICD-10-CM | POA: Diagnosis not present

## 2023-07-10 DIAGNOSIS — M8580 Other specified disorders of bone density and structure, unspecified site: Secondary | ICD-10-CM | POA: Diagnosis not present

## 2023-07-10 DIAGNOSIS — I700 Atherosclerosis of aorta: Secondary | ICD-10-CM | POA: Diagnosis not present

## 2023-07-10 DIAGNOSIS — W19XXXA Unspecified fall, initial encounter: Secondary | ICD-10-CM

## 2023-07-10 DIAGNOSIS — Z7982 Long term (current) use of aspirin: Secondary | ICD-10-CM | POA: Insufficient documentation

## 2023-07-10 DIAGNOSIS — M25561 Pain in right knee: Secondary | ICD-10-CM | POA: Insufficient documentation

## 2023-07-10 DIAGNOSIS — S0990XA Unspecified injury of head, initial encounter: Secondary | ICD-10-CM | POA: Diagnosis not present

## 2023-07-10 DIAGNOSIS — E119 Type 2 diabetes mellitus without complications: Secondary | ICD-10-CM | POA: Insufficient documentation

## 2023-07-10 DIAGNOSIS — M85841 Other specified disorders of bone density and structure, right hand: Secondary | ICD-10-CM | POA: Diagnosis not present

## 2023-07-10 DIAGNOSIS — Y9301 Activity, walking, marching and hiking: Secondary | ICD-10-CM | POA: Diagnosis not present

## 2023-07-10 DIAGNOSIS — Y92002 Bathroom of unspecified non-institutional (private) residence single-family (private) house as the place of occurrence of the external cause: Secondary | ICD-10-CM | POA: Diagnosis not present

## 2023-07-10 DIAGNOSIS — K5730 Diverticulosis of large intestine without perforation or abscess without bleeding: Secondary | ICD-10-CM | POA: Diagnosis not present

## 2023-07-10 DIAGNOSIS — K449 Diaphragmatic hernia without obstruction or gangrene: Secondary | ICD-10-CM | POA: Diagnosis not present

## 2023-07-10 DIAGNOSIS — Z79899 Other long term (current) drug therapy: Secondary | ICD-10-CM | POA: Insufficient documentation

## 2023-07-10 DIAGNOSIS — R410 Disorientation, unspecified: Secondary | ICD-10-CM | POA: Insufficient documentation

## 2023-07-10 DIAGNOSIS — W010XXA Fall on same level from slipping, tripping and stumbling without subsequent striking against object, initial encounter: Secondary | ICD-10-CM | POA: Insufficient documentation

## 2023-07-10 DIAGNOSIS — S0083XA Contusion of other part of head, initial encounter: Secondary | ICD-10-CM | POA: Insufficient documentation

## 2023-07-10 DIAGNOSIS — S63104A Unspecified dislocation of right thumb, initial encounter: Secondary | ICD-10-CM

## 2023-07-10 DIAGNOSIS — M25512 Pain in left shoulder: Secondary | ICD-10-CM | POA: Diagnosis not present

## 2023-07-10 DIAGNOSIS — S63114A Dislocation of metacarpophalangeal joint of right thumb, initial encounter: Secondary | ICD-10-CM | POA: Diagnosis not present

## 2023-07-10 DIAGNOSIS — Z7984 Long term (current) use of oral hypoglycemic drugs: Secondary | ICD-10-CM | POA: Insufficient documentation

## 2023-07-10 DIAGNOSIS — M19011 Primary osteoarthritis, right shoulder: Secondary | ICD-10-CM | POA: Diagnosis not present

## 2023-07-10 DIAGNOSIS — M79652 Pain in left thigh: Secondary | ICD-10-CM | POA: Diagnosis not present

## 2023-07-10 DIAGNOSIS — M19041 Primary osteoarthritis, right hand: Secondary | ICD-10-CM | POA: Diagnosis not present

## 2023-07-10 DIAGNOSIS — S63124A Dislocation of unspecified interphalangeal joint of right thumb, initial encounter: Secondary | ICD-10-CM | POA: Diagnosis not present

## 2023-07-10 DIAGNOSIS — S01511A Laceration without foreign body of lip, initial encounter: Secondary | ICD-10-CM | POA: Diagnosis not present

## 2023-07-10 DIAGNOSIS — R109 Unspecified abdominal pain: Secondary | ICD-10-CM | POA: Insufficient documentation

## 2023-07-10 DIAGNOSIS — S60931A Unspecified superficial injury of right thumb, initial encounter: Secondary | ICD-10-CM | POA: Diagnosis present

## 2023-07-10 DIAGNOSIS — M799 Soft tissue disorder, unspecified: Secondary | ICD-10-CM | POA: Diagnosis not present

## 2023-07-10 DIAGNOSIS — M25562 Pain in left knee: Secondary | ICD-10-CM | POA: Diagnosis not present

## 2023-07-10 DIAGNOSIS — M25559 Pain in unspecified hip: Secondary | ICD-10-CM | POA: Diagnosis not present

## 2023-07-10 DIAGNOSIS — T797XXA Traumatic subcutaneous emphysema, initial encounter: Secondary | ICD-10-CM | POA: Diagnosis not present

## 2023-07-10 DIAGNOSIS — S299XXA Unspecified injury of thorax, initial encounter: Secondary | ICD-10-CM | POA: Diagnosis not present

## 2023-07-10 DIAGNOSIS — M79621 Pain in right upper arm: Secondary | ICD-10-CM | POA: Diagnosis not present

## 2023-07-10 DIAGNOSIS — S3991XA Unspecified injury of abdomen, initial encounter: Secondary | ICD-10-CM | POA: Diagnosis not present

## 2023-07-10 DIAGNOSIS — I10 Essential (primary) hypertension: Secondary | ICD-10-CM | POA: Diagnosis not present

## 2023-07-10 DIAGNOSIS — S199XXA Unspecified injury of neck, initial encounter: Secondary | ICD-10-CM | POA: Diagnosis not present

## 2023-07-10 DIAGNOSIS — R9082 White matter disease, unspecified: Secondary | ICD-10-CM | POA: Diagnosis not present

## 2023-07-10 DIAGNOSIS — M7989 Other specified soft tissue disorders: Secondary | ICD-10-CM | POA: Diagnosis not present

## 2023-07-10 DIAGNOSIS — M47816 Spondylosis without myelopathy or radiculopathy, lumbar region: Secondary | ICD-10-CM | POA: Diagnosis not present

## 2023-07-10 DIAGNOSIS — M25461 Effusion, right knee: Secondary | ICD-10-CM | POA: Diagnosis not present

## 2023-07-10 DIAGNOSIS — M1712 Unilateral primary osteoarthritis, left knee: Secondary | ICD-10-CM | POA: Diagnosis not present

## 2023-07-10 DIAGNOSIS — T148XXA Other injury of unspecified body region, initial encounter: Secondary | ICD-10-CM

## 2023-07-10 LAB — URINALYSIS, ROUTINE W REFLEX MICROSCOPIC
Bilirubin Urine: NEGATIVE
Glucose, UA: >=500 mg/dL — AB
Hgb urine dipstick: NEGATIVE
Ketones, ur: 15 mg/dL — AB
Leukocytes,Ua: NEGATIVE
Nitrite: NEGATIVE
Protein, ur: NEGATIVE mg/dL
Specific Gravity, Urine: 1.015 (ref 1.005–1.030)
pH: 5.5 (ref 5.0–8.0)

## 2023-07-10 LAB — CBC WITH DIFFERENTIAL/PLATELET
Abs Immature Granulocytes: 0.04 10*3/uL (ref 0.00–0.07)
Basophils Absolute: 0.1 10*3/uL (ref 0.0–0.1)
Basophils Relative: 0 %
Eosinophils Absolute: 0.1 10*3/uL (ref 0.0–0.5)
Eosinophils Relative: 1 %
HCT: 36 % (ref 36.0–46.0)
Hemoglobin: 11.3 g/dL — ABNORMAL LOW (ref 12.0–15.0)
Immature Granulocytes: 0 %
Lymphocytes Relative: 11 %
Lymphs Abs: 1.4 10*3/uL (ref 0.7–4.0)
MCH: 28.2 pg (ref 26.0–34.0)
MCHC: 31.4 g/dL (ref 30.0–36.0)
MCV: 89.8 fL (ref 80.0–100.0)
Monocytes Absolute: 0.8 10*3/uL (ref 0.1–1.0)
Monocytes Relative: 7 %
Neutro Abs: 10 10*3/uL — ABNORMAL HIGH (ref 1.7–7.7)
Neutrophils Relative %: 81 %
Platelets: 328 10*3/uL (ref 150–400)
RBC: 4.01 MIL/uL (ref 3.87–5.11)
RDW: 15.1 % (ref 11.5–15.5)
WBC: 12.4 10*3/uL — ABNORMAL HIGH (ref 4.0–10.5)
nRBC: 0 % (ref 0.0–0.2)

## 2023-07-10 LAB — COMPREHENSIVE METABOLIC PANEL
ALT: 14 U/L (ref 0–44)
AST: 18 U/L (ref 15–41)
Albumin: 3.4 g/dL — ABNORMAL LOW (ref 3.5–5.0)
Alkaline Phosphatase: 71 U/L (ref 38–126)
Anion gap: 11 (ref 5–15)
BUN: 17 mg/dL (ref 8–23)
CO2: 21 mmol/L — ABNORMAL LOW (ref 22–32)
Calcium: 8.7 mg/dL — ABNORMAL LOW (ref 8.9–10.3)
Chloride: 107 mmol/L (ref 98–111)
Creatinine, Ser: 0.82 mg/dL (ref 0.44–1.00)
GFR, Estimated: >60 mL/min (ref >60)
Glucose, Bld: 128 mg/dL — ABNORMAL HIGH (ref 70–99)
Potassium: 4.1 mmol/L (ref 3.5–5.1)
Sodium: 139 mmol/L (ref 135–145)
Total Bilirubin: 0.6 mg/dL (ref 0.3–1.2)
Total Protein: 6.7 g/dL (ref 6.5–8.1)

## 2023-07-10 LAB — URINALYSIS, MICROSCOPIC (REFLEX)

## 2023-07-10 MED ORDER — IBUPROFEN 400 MG PO TABS
600.0000 mg | ORAL_TABLET | Freq: Once | ORAL | Status: AC
  Administered 2023-07-10: 600 mg via ORAL
  Filled 2023-07-10: qty 1

## 2023-07-10 MED ORDER — IOHEXOL 300 MG/ML  SOLN
100.0000 mL | Freq: Once | INTRAMUSCULAR | Status: AC | PRN
  Administered 2023-07-10: 100 mL via INTRAVENOUS

## 2023-07-10 MED ORDER — ACETAMINOPHEN 500 MG PO TABS
1000.0000 mg | ORAL_TABLET | Freq: Once | ORAL | Status: AC
  Administered 2023-07-10: 1000 mg via ORAL
  Filled 2023-07-10: qty 2

## 2023-07-10 MED ORDER — MORPHINE SULFATE (PF) 4 MG/ML IV SOLN
4.0000 mg | Freq: Once | INTRAVENOUS | Status: AC
  Administered 2023-07-10: 4 mg via INTRAVENOUS
  Filled 2023-07-10: qty 1

## 2023-07-10 MED ORDER — LIDOCAINE HCL (PF) 1 % IJ SOLN
10.0000 mL | Freq: Once | INTRAMUSCULAR | Status: AC
  Administered 2023-07-10: 10 mL
  Filled 2023-07-10: qty 10

## 2023-07-10 NOTE — Discharge Instructions (Addendum)
You were seen in the ER today for evaluation of after your fall. You dislocated your thumb and we were able to reduce this for you.  You will need to follow-up with a hand specialist for reevaluation of this given that you likely have some tendon or ligament damage.  You will need to leave the thumb spica splint on that was given to you.  Please do not take off.  I have included the information for the hand specialist into the discharge paperwork.  Please make sure you call them to schedule an appointment.  Additionally, I would like for you to be reevaluated by your primary care provider in the next week for reevaluation.  For your facial bruising, I do recommend applying ice for 15 minutes every few hours.  He can also do this to your knees as well.  For pain, I recommend taking 1000 mg of Tylenol and/or 600 mg of ibuprofen every 6 hours as needed for pain.  You will likely experience some aches and pains over the next few days.  If you have any concerns, new or worsening symptoms, please return to your nearest emergency department for reevaluation.  Contact a doctor if: These symptoms do not go away: Headaches. Dizziness. Double vision or vision changes. Trouble sleeping. Changes in mood. You have new symptoms. Get help right away if: You have sudden: Headache that is very bad. Vomiting that does not stop. Changes in the size of one of your pupils. Pupils are the black centers of your eyes. Changes in how you see (vision). More confusion or more grumpy moods. You have a seizure. Your symptoms get worse. You have a clear or bloody fluid coming from your nose or ears. These symptoms may be an emergency. Get help right away. Call 911. Do not wait to see if the symptoms will go away. Do not drive yourself to the hospital.

## 2023-07-10 NOTE — ED Provider Notes (Cosign Needed Addendum)
[facility] Provider Note   CSN: [id number] Arrival date & time: 07/10/23  1140     History Chief Complaint  Patient presents with   Fall   Head Injury   Finger Injury    Pamela [name] is a 77 y.o. female with history of hypertension and type 2 diabetes presents emerged from today for evaluation of mechanical fall.  Patient reports that she was walking to the bathroom today and accidentally urinated on herself.  She reports that she slipped in the urine and fell forward in her bathroom.  She denies any loss of consciousness.  She reports that she hit her face, right hand, left shoulder, and landed on her knees.  She was ambulatory afterwards.  She has been acting at her baseline per husband.  She does appear to be slightly confused sometimes however still oriented x 3.  Husband reports that this has been her baseline and they been talking to the PCP about this but she is at her normal mental state.  She has an obvious deformity to her right thumb which is causing her pain.  She denies any headache or chest pain or shortness of breath.  Denies any belly pain, nausea, vomiting.  She denies any visual changes.  Her husband said that he was there almost immediately and did not see the patient lose any consciousness and was able to help her up.  She is on a baby aspirin daily but no other anticoagulant use.  She denies any smoking, EtOH, illicit drug use.   Fall Pertinent negatives include no chest pain, no abdominal pain, no headaches and no shortness of breath.  Head Injury Associated symptoms: no headaches, no nausea, no neck pain and no vomiting        Home Medications Prior to Admission medications   Medication Sig Start Date End Date Taking? Authorizing Provider  amLODipine (NORVASC) 5 MG tablet TAKE 1 TABLET DAILY 12/02/22   Lennette Bihari, MD  aspirin 81 MG EC tablet 1 tablet 08/28/21   [provider]  calcium carbonate  (TUMS EX) 750 MG chewable tablet Chew 1 tablet by mouth as needed for heartburn.    [provider]  carvedilol (COREG) 12.5 MG tablet Take 1 tablet (12.5 mg total) by mouth 2 (two) times daily with a meal. 01/11/23   Lennette Bihari, MD  cholecalciferol (VITAMIN D) 25 MCG (1000 UNIT) tablet 1,000 Units daily. 01/13/16   [provider]  Coenzyme Q10 (CO Q 10) 100 MG CAPS Take 300 mg by mouth daily.    [provider]  dicyclomine (BENTYL) 20 MG tablet Take 1 tablet (20 mg total) by mouth 4 (four) times daily as needed for spasms. 05/27/23   Jenel Lucks, MD  empagliflozin (JARDIANCE) 10 MG TABS tablet Take 5 mg by mouth daily.    [provider]  ezetimibe (ZETIA) 10 MG tablet TAKE 1 TABLET DAILY 10/29/22   Lennette Bihari, MD  famotidine (PEPCID) 20 MG tablet Take 20 mg by mouth daily as needed for heartburn or indigestion. 10/20/18   [provider]  ferrous sulfate 324 (65 Fe) MG TBEC Take 1 tablet (325 mg total) by mouth every other day. 09/11/21   Tressia Danas, MD  glimepiride (AMARYL) 2 MG tablet Take 2 mg by mouth daily before breakfast.    [provider]  hydrochlorothiazide (HYDRODIURIL) 25 MG tablet TAKE 1 TABLET DAILY 11/08/22   Corrin Parker,  PA-C  levothyroxine (SYNTHROID, LEVOTHROID) 137 MCG tablet Take 137 mcg by mouth daily before breakfast. One daily, 1 and 1/2 on Sunday    [provider]  linaGLIPtin-metFORMIN HCl ER 2.01-999 MG TB24 Take 2 tablets by mouth every morning.     [provider]  lisinopril (ZESTRIL) 40 MG tablet TAKE 1 TABLET DAILY 11/08/22   Marjie Skiff E, PA-C  loperamide (IMODIUM) 2 MG capsule Take 2 capsules (4 mg total) by mouth as needed for diarrhea or loose stools. 06/15/22   Tressia Danas, MD  metroNIDAZOLE (METROCREAM) 0.75 % cream Apply 1 application  topically daily as needed (rosacea). 01/16/20   [provider]  pantoprazole (PROTONIX) 40 MG tablet TAKE 1  TABLET TWICE A DAY 07/19/22   Tressia Danas, MD  Polyvinyl Alcohol-Povidone (REFRESH OP) Apply 1 drop to eye as needed.    [provider]      Allergies    Demerol [meperidine], Macrobid [nitrofurantoin macrocrystal], Macrobid [nitrofurantoin], Meclizine, and Sulfa antibiotics    Review of Systems   Review of Systems  Constitutional:  Negative for chills and fever.  HENT:         Reprots facial pain  Respiratory:  Negative for shortness of breath.   Cardiovascular:  Negative for chest pain.  Gastrointestinal:  Negative for abdominal pain, nausea and vomiting.  Genitourinary:  Negative for dysuria.  Musculoskeletal:  Positive for arthralgias, back pain and myalgias. Negative for neck pain and neck stiffness.  Neurological:  Negative for dizziness, syncope, light-headedness and headaches.    Physical Exam Updated Vital Signs BP (!) 126/55   Pulse 72   Temp 98.2 F (36.8 C)   Resp 18   Ht 5\' 8"  (1.727 m)   Wt 92 kg   SpO2 95%   BMI 30.84 kg/m  Physical Exam Vitals and nursing note reviewed.  Constitutional:      General: She is not in acute distress.    Appearance: She is not toxic-appearing.  HENT:     Head:     Comments: No tenderness to the forehead or maxillary area.  No pain on opening closing the jaw.  No battle signs or raccoon eyes.  Scalp is nontender to palpation.    Right Ear: Tympanic membrane, ear canal and external ear normal.     Left Ear: Tympanic membrane, ear canal and external ear normal.     Nose:     Comments: Does have some bruising noted to the philtrum, without any septal hematomas.  There is small amount of dried blood present in the right nare.    Mouth/Throat:     Mouth: Mucous membranes are moist.     Comments: Dentition appears to be intact.  Nontender upon palpation.  She does have significant swelling and bruising to the upper lip however frenulum is intact and the inner mucosa where the inner mucosa of the lip and dentition gum  meet there is no laceration or open space.  There is no abrasion or laceration that needs repair, just swollen tissue. Eyes:     General: No scleral icterus.    Extraocular Movements: Extraocular movements intact.     Pupils: Pupils are equal, round, and reactive to light.  Neck:     Comments: No step-offs or deformities.  No overlying skin changes.  No nontender to palpation. Cardiovascular:     Rate and Rhythm: Normal rate.     Pulses: Normal pulses.  Pulmonary:     Effort: Pulmonary effort is  normal. No respiratory distress.     Breath sounds: Normal breath sounds.  Abdominal:     Palpations: Abdomen is soft.     Tenderness: There is no abdominal tenderness. There is no guarding or rebound.     Comments: Does have reducible and soft umbilical hernia.  No overlying skin changes or signs of trauma.  Musculoskeletal:        General: Tenderness present.     Cervical back: Normal range of motion. No tenderness.     Comments: BACK -no midline cervical, thoracic, lumbar tenderness palpation.  The patient does have some bruising seen to the more left paraspinal final lower cervical, thoracic.  Approximate this as a bar of soap.  No hematoma or significant bruising seen otherwise to the flanks.  No step-offs or deformities.  LUE -nontender to palpation throughout the entire left upper extremity.  Compartments are soft.  No step-offs tenderness.  Brisk up refill present in all 5 fingers.  Palpable pulses that are symmetric bilaterally.  RUE -obvious deformity seen to the right thumb.  No laceration or abrasion.  Skin is intact however there is some bruising and swelling present.  Brisk refill present on all 5 fingers.  No snuffbox tenderness to palpation.  Compartments are soft.  She does have some tenderness to the humerus however no tenderness of the right shoulder or elbow.  No pain with flexion or extension of the elbow.  No pain with flexion extension of the wrist.  LLE -some bruising seen to  the anterior knee but still has flexion extension present.  There is some tenderness more to the medial aspect of the thigh.  She does have a mass in the posterior thigh which she reports has been there for years.  It is nontender.  No overlying erythema or warmth to the area.  No tenderness to the lateral aspect of the hip.  Palpable DP and PT pulses bilaterally.  Reported sensations intact and symmetric throughout.  Compartments are soft.  Strength is 5 of 5 in patient's lower extremities  RLE -some bruising seen to the anterior knee. no tenderness into the lower or upper leg.  No tenderness to the lateral aspect of the hip flexion extension still present but does have some pain. Palpable DP and PT pulses bilaterally.  Reported sensations intact and symmetric throughout.  Compartments are soft. Strength is 5 of 5 in patient's lower extremities   Skin:    General: Skin is warm and dry.  Neurological:     General: No focal deficit present.     Mental Status: She is alert and oriented to person, place, and time. Mental status is at baseline.     Cranial Nerves: No cranial nerve deficit.     Sensory: No sensory deficit.     Motor: No weakness.     Comments: Does appear to be mildly confused at times, however at baseline per patient's husband.  Husband reports that they have been seeing the PCP about her occasionally being some confusion or having to memory problems.  This is been going on for months.     ED Results / Procedures / Treatments   Labs (all labs ordered are listed, but only abnormal results are displayed) Labs Reviewed  CBC WITH DIFFERENTIAL/PLATELET - Abnormal; Notable for the following components:      Result Value   WBC 12.4 (*)    Hemoglobin 11.3 (*)    Neutro Abs 10.0 (*)    All other components within  normal limits  URINALYSIS, ROUTINE W REFLEX MICROSCOPIC - Abnormal; Notable for the following components:   Glucose, UA >=500 (*)    Ketones, ur 15 (*)    All other  components within normal limits  COMPREHENSIVE METABOLIC PANEL - Abnormal; Notable for the following components:   CO2 21 (*)    Glucose, Bld 128 (*)    Calcium 8.7 (*)    Albumin 3.4 (*)    All other components within normal limits  URINALYSIS, MICROSCOPIC (REFLEX) - Abnormal; Notable for the following components:   Bacteria, UA MANY (*)    All other components within normal limits  URINE CULTURE    EKG None  Radiology DG Humerus Right  Result Date: 07/10/2023 CLINICAL DATA:  Larey Seat, right upper arm pain EXAM: RIGHT HUMERUS - 2+ VIEW COMPARISON:  None Available. FINDINGS: Frontal and lateral views of the right humerus are obtained on 3 images. No acute displaced fracture. Alignment of the right shoulder and elbow is anatomic. There is moderate acromioclavicular and glenohumeral joint osteoarthritis. Well corticated ossific density along the medial humeral epicondyle likely degenerative or sequela of prior healed trauma. Soft tissues are unremarkable. IMPRESSION: 1. No acute displaced fracture. 2. Degenerative changes about the right shoulder. Electronically Signed   By: Sharlet Salina M.D.   On: 07/10/2023 19:45   CT CHEST ABDOMEN PELVIS W CONTRAST  Result Date: 07/10/2023 CLINICAL DATA:  Trauma, fall, left-sided flank and back pain, bruising EXAM: CT CHEST, ABDOMEN, AND PELVIS WITH CONTRAST CT THORACIC AND LUMBAR SPINE WITH CONTRAST TECHNIQUE: Multidetector CT imaging of the chest, abdomen and pelvis was performed following the standard protocol during bolus administration of intravenous contrast. Multidetector CT imaging of the thoracic and lumbar spine was performed following the standard protocol during bolus administration of intravenous contrast. RADIATION DOSE REDUCTION: This exam was performed according to the departmental dose-optimization program which includes automated exposure control, adjustment of the mA and/or kV according to patient size and/or use of iterative reconstruction  technique. CONTRAST:  OMNIPAQUE IOHEXOL 300 MG/ML  SOLN COMPARISON:  CT abdomen pelvis, 01/13/2022 FINDINGS: CT CHEST FINDINGS Cardiovascular: Scattered aortic atherosclerosis. Normal heart size. Left coronary artery calcifications. No pericardial effusion. Mediastinum/Nodes: No enlarged mediastinal, hilar, or axillary lymph nodes. Small hiatal hernia. Thyroid gland, trachea, and esophagus demonstrate no significant findings. Lungs/Pleura: Lungs are clear. No pleural effusion or pneumothorax. Musculoskeletal: No chest wall mass or suspicious osseous lesions identified. CT ABDOMEN PELVIS FINDINGS Hepatobiliary: No focal liver abnormality is seen. Status post cholecystectomy. No biliary dilatation. Pancreas: Unremarkable. No pancreatic ductal dilatation or surrounding inflammatory changes. Spleen: Normal in size without significant abnormality. Adrenals/Urinary Tract: Adrenal glands are unremarkable. Kidneys are normal, without renal calculi, solid lesion, or hydronephrosis. Bladder is unremarkable. Stomach/Bowel: Stomach is within normal limits. Appendix not clearly visualized. No evidence of bowel wall thickening, distention, or inflammatory changes. Sigmoid diverticulosis. Vascular/Lymphatic: Aortic atherosclerosis. No enlarged abdominal or pelvic lymph nodes. Reproductive: Status post hysterectomy. Other: Fat containing midline ventral hernia (series 301, image 93). No ascites. Musculoskeletal: No acute osseous findings. CT THORACIC AND LUMBAR SPINE FINDINGS Alignment: Normal thoracic kyphosis. Degenerative straightening of the normal lumbar lordosis. Vertebral bodies: Intact. No fracture or dislocation. Disc spaces: Moderate disc space height loss and osteophytosis throughout the thoracic and lumbar spine, with bridging osteophytosis throughout the mid to lower thoracic spine in keeping with DISH. Focally severe disc degenerative change at L3-L4. Paraspinous soft tissues: Unremarkable. IMPRESSION: 1. No CT  evidence of acute traumatic injury to the chest, abdomen, or  pelvis. 2. No fracture or dislocation of the thoracic or lumbar spine. 3. Sigmoid diverticulosis without evidence of acute diverticulitis. 4. Coronary artery disease. Aortic Atherosclerosis (ICD10-I70.0). Electronically Signed   By: Jearld Lesch M.D.   On: 07/10/2023 16:17   CT T-SPINE NO CHARGE  Result Date: 07/10/2023 CLINICAL DATA:  Trauma, fall, left-sided flank and back pain, bruising EXAM: CT CHEST, ABDOMEN, AND PELVIS WITH CONTRAST CT THORACIC AND LUMBAR SPINE WITH CONTRAST TECHNIQUE: Multidetector CT imaging of the chest, abdomen and pelvis was performed following the standard protocol during bolus administration of intravenous contrast. Multidetector CT imaging of the thoracic and lumbar spine was performed following the standard protocol during bolus administration of intravenous contrast. RADIATION DOSE REDUCTION: This exam was performed according to the departmental dose-optimization program which includes automated exposure control, adjustment of the mA and/or kV according to patient size and/or use of iterative reconstruction technique. CONTRAST:  OMNIPAQUE IOHEXOL 300 MG/ML  SOLN COMPARISON:  CT abdomen pelvis, 01/13/2022 FINDINGS: CT CHEST FINDINGS Cardiovascular: Scattered aortic atherosclerosis. Normal heart size. Left coronary artery calcifications. No pericardial effusion. Mediastinum/Nodes: No enlarged mediastinal, hilar, or axillary lymph nodes. Small hiatal hernia. Thyroid gland, trachea, and esophagus demonstrate no significant findings. Lungs/Pleura: Lungs are clear. No pleural effusion or pneumothorax. Musculoskeletal: No chest wall mass or suspicious osseous lesions identified. CT ABDOMEN PELVIS FINDINGS Hepatobiliary: No focal liver abnormality is seen. Status post cholecystectomy. No biliary dilatation. Pancreas: Unremarkable. No pancreatic ductal dilatation or surrounding inflammatory changes. Spleen: Normal in  size without significant abnormality. Adrenals/Urinary Tract: Adrenal glands are unremarkable. Kidneys are normal, without renal calculi, solid lesion, or hydronephrosis. Bladder is unremarkable. Stomach/Bowel: Stomach is within normal limits. Appendix not clearly visualized. No evidence of bowel wall thickening, distention, or inflammatory changes. Sigmoid diverticulosis. Vascular/Lymphatic: Aortic atherosclerosis. No enlarged abdominal or pelvic lymph nodes. Reproductive: Status post hysterectomy. Other: Fat containing midline ventral hernia (series 301, image 93). No ascites. Musculoskeletal: No acute osseous findings. CT THORACIC AND LUMBAR SPINE FINDINGS Alignment: Normal thoracic kyphosis. Degenerative straightening of the normal lumbar lordosis. Vertebral bodies: Intact. No fracture or dislocation. Disc spaces: Moderate disc space height loss and osteophytosis throughout the thoracic and lumbar spine, with bridging osteophytosis throughout the mid to lower thoracic spine in keeping with DISH. Focally severe disc degenerative change at L3-L4. Paraspinous soft tissues: Unremarkable. IMPRESSION: 1. No CT evidence of acute traumatic injury to the chest, abdomen, or pelvis. 2. No fracture or dislocation of the thoracic or lumbar spine. 3. Sigmoid diverticulosis without evidence of acute diverticulitis. 4. Coronary artery disease. Aortic Atherosclerosis (ICD10-I70.0). Electronically Signed   By: Jearld Lesch M.D.   On: 07/10/2023 16:17   CT L-SPINE NO CHARGE  Result Date: 07/10/2023 CLINICAL DATA:  Trauma, fall, left-sided flank and back pain, bruising EXAM: CT CHEST, ABDOMEN, AND PELVIS WITH CONTRAST CT THORACIC AND LUMBAR SPINE WITH CONTRAST TECHNIQUE: Multidetector CT imaging of the chest, abdomen and pelvis was performed following the standard protocol during bolus administration of intravenous contrast. Multidetector CT imaging of the thoracic and lumbar spine was performed following the standard  protocol during bolus administration of intravenous contrast. RADIATION DOSE REDUCTION: This exam was performed according to the departmental dose-optimization program which includes automated exposure control, adjustment of the mA and/or kV according to patient size and/or use of iterative reconstruction technique. CONTRAST:  OMNIPAQUE IOHEXOL 300 MG/ML  SOLN COMPARISON:  CT abdomen pelvis, 01/13/2022 FINDINGS: CT CHEST FINDINGS Cardiovascular: Scattered aortic atherosclerosis. Normal heart size. Left coronary artery calcifications. No pericardial effusion.  Mediastinum/Nodes: No enlarged mediastinal, hilar, or axillary lymph nodes. Small hiatal hernia. Thyroid gland, trachea, and esophagus demonstrate no significant findings. Lungs/Pleura: Lungs are clear. No pleural effusion or pneumothorax. Musculoskeletal: No chest wall mass or suspicious osseous lesions identified. CT ABDOMEN PELVIS FINDINGS Hepatobiliary: No focal liver abnormality is seen. Status post cholecystectomy. No biliary dilatation. Pancreas: Unremarkable. No pancreatic ductal dilatation or surrounding inflammatory changes. Spleen: Normal in size without significant abnormality. Adrenals/Urinary Tract: Adrenal glands are unremarkable. Kidneys are normal, without renal calculi, solid lesion, or hydronephrosis. Bladder is unremarkable. Stomach/Bowel: Stomach is within normal limits. Appendix not clearly visualized. No evidence of bowel wall thickening, distention, or inflammatory changes. Sigmoid diverticulosis. Vascular/Lymphatic: Aortic atherosclerosis. No enlarged abdominal or pelvic lymph nodes. Reproductive: Status post hysterectomy. Other: Fat containing midline ventral hernia (series 301, image 93). No ascites. Musculoskeletal: No acute osseous findings. CT THORACIC AND LUMBAR SPINE FINDINGS Alignment: Normal thoracic kyphosis. Degenerative straightening of the normal lumbar lordosis. Vertebral bodies: Intact. No fracture or dislocation.  Disc spaces: Moderate disc space height loss and osteophytosis throughout the thoracic and lumbar spine, with bridging osteophytosis throughout the mid to lower thoracic spine in keeping with DISH. Focally severe disc degenerative change at L3-L4. Paraspinous soft tissues: Unremarkable. IMPRESSION: 1. No CT evidence of acute traumatic injury to the chest, abdomen, or pelvis. 2. No fracture or dislocation of the thoracic or lumbar spine. 3. Sigmoid diverticulosis without evidence of acute diverticulitis. 4. Coronary artery disease. Aortic Atherosclerosis (ICD10-I70.0). Electronically Signed   By: Jearld Lesch M.D.   On: 07/10/2023 16:17   CT Head Wo Contrast  Result Date: 07/10/2023 CLINICAL DATA:  Slip and fall, facial injury pain EXAM: CT HEAD WITHOUT CONTRAST CT MAXILLOFACIAL WITHOUT CONTRAST CT CERVICAL SPINE WITHOUT CONTRAST TECHNIQUE: Multidetector CT imaging of the head, cervical spine, and maxillofacial structures were performed using the standard protocol without intravenous contrast. Multiplanar CT image reconstructions of the cervical spine and maxillofacial structures were also generated. RADIATION DOSE REDUCTION: This exam was performed according to the departmental dose-optimization program which includes automated exposure control, adjustment of the mA and/or kV according to patient size and/or use of iterative reconstruction technique. COMPARISON:  10/05/2021 FINDINGS: CT HEAD FINDINGS Brain: No evidence of acute infarction, hemorrhage, hydrocephalus, extra-axial collection or mass lesion/mass effect. Periventricular white matter hypodensity. Vascular: No hyperdense vessel or unexpected calcification. CT FACIAL BONES FINDINGS Skull: Normal. Negative for fracture or focal lesion. Facial bones: No displaced fractures or dislocations. Sinuses/Orbits: No acute finding. Other: Laceration of the upper lip with subcutaneous emphysema (series 305, image 37) CT CERVICAL SPINE FINDINGS Alignment: Normal.  Skull base and vertebrae: No acute fracture. No primary bone lesion or focal pathologic process. Soft tissues and spinal canal: No prevertebral fluid or swelling. No visible canal hematoma. Disc levels: Moderate multilevel disc space height loss and osteophytosis, worst from C4-C7. Upper chest: Negative. Other: None. IMPRESSION: 1. No acute intracranial pathology. 2. No displaced fractures or dislocations of the facial bones. 3. Laceration of the upper lip with subcutaneous emphysema. 4. No fracture or static subluxation of the cervical spine. 5. Moderate multilevel cervical disc degenerative disease. Electronically Signed   By: Jearld Lesch M.D.   On: 07/10/2023 16:07   CT Cervical Spine Wo Contrast  Result Date: 07/10/2023 CLINICAL DATA:  Slip and fall, facial injury pain EXAM: CT HEAD WITHOUT CONTRAST CT MAXILLOFACIAL WITHOUT CONTRAST CT CERVICAL SPINE WITHOUT CONTRAST TECHNIQUE: Multidetector CT imaging of the head, cervical spine, and maxillofacial structures were performed using the standard protocol without intravenous  contrast. Multiplanar CT image reconstructions of the cervical spine and maxillofacial structures were also generated. RADIATION DOSE REDUCTION: This exam was performed according to the departmental dose-optimization program which includes automated exposure control, adjustment of the mA and/or kV according to patient size and/or use of iterative reconstruction technique. COMPARISON:  10/05/2021 FINDINGS: CT HEAD FINDINGS Brain: No evidence of acute infarction, hemorrhage, hydrocephalus, extra-axial collection or mass lesion/mass effect. Periventricular white matter hypodensity. Vascular: No hyperdense vessel or unexpected calcification. CT FACIAL BONES FINDINGS Skull: Normal. Negative for fracture or focal lesion. Facial bones: No displaced fractures or dislocations. Sinuses/Orbits: No acute finding. Other: Laceration of the upper lip with subcutaneous emphysema (series 305, image 37) CT  CERVICAL SPINE FINDINGS Alignment: Normal. Skull base and vertebrae: No acute fracture. No primary bone lesion or focal pathologic process. Soft tissues and spinal canal: No prevertebral fluid or swelling. No visible canal hematoma. Disc levels: Moderate multilevel disc space height loss and osteophytosis, worst from C4-C7. Upper chest: Negative. Other: None. IMPRESSION: 1. No acute intracranial pathology. 2. No displaced fractures or dislocations of the facial bones. 3. Laceration of the upper lip with subcutaneous emphysema. 4. No fracture or static subluxation of the cervical spine. 5. Moderate multilevel cervical disc degenerative disease. Electronically Signed   By: Jearld Lesch M.D.   On: 07/10/2023 16:07   CT Maxillofacial Wo Contrast  Result Date: 07/10/2023 CLINICAL DATA:  Slip and fall, facial injury pain EXAM: CT HEAD WITHOUT CONTRAST CT MAXILLOFACIAL WITHOUT CONTRAST CT CERVICAL SPINE WITHOUT CONTRAST TECHNIQUE: Multidetector CT imaging of the head, cervical spine, and maxillofacial structures were performed using the standard protocol without intravenous contrast. Multiplanar CT image reconstructions of the cervical spine and maxillofacial structures were also generated. RADIATION DOSE REDUCTION: This exam was performed according to the departmental dose-optimization program which includes automated exposure control, adjustment of the mA and/or kV according to patient size and/or use of iterative reconstruction technique. COMPARISON:  10/05/2021 FINDINGS: CT HEAD FINDINGS Brain: No evidence of acute infarction, hemorrhage, hydrocephalus, extra-axial collection or mass lesion/mass effect. Periventricular white matter hypodensity. Vascular: No hyperdense vessel or unexpected calcification. CT FACIAL BONES FINDINGS Skull: Normal. Negative for fracture or focal lesion. Facial bones: No displaced fractures or dislocations. Sinuses/Orbits: No acute finding. Other: Laceration of the upper lip with  subcutaneous emphysema (series 305, image 37) CT CERVICAL SPINE FINDINGS Alignment: Normal. Skull base and vertebrae: No acute fracture. No primary bone lesion or focal pathologic process. Soft tissues and spinal canal: No prevertebral fluid or swelling. No visible canal hematoma. Disc levels: Moderate multilevel disc space height loss and osteophytosis, worst from C4-C7. Upper chest: Negative. Other: None. IMPRESSION: 1. No acute intracranial pathology. 2. No displaced fractures or dislocations of the facial bones. 3. Laceration of the upper lip with subcutaneous emphysema. 4. No fracture or static subluxation of the cervical spine. 5. Moderate multilevel cervical disc degenerative disease. Electronically Signed   By: Jearld Lesch M.D.   On: 07/10/2023 16:07   DG Pelvis Portable  Result Date: 07/10/2023 CLINICAL DATA:  Fall with pain and bruising EXAM: PORTABLE PELVIS 1-2 VIEWS COMPARISON:  None Available. FINDINGS: Osteopenia. No acute pelvic fracture or diastasis on single AP radiograph. Degenerative changes of the lower lumbar spine. Surgical clips project over the pelvis. Vascular calcifications. Sacrum is obscured by overlapping bowel contents. IMPRESSION: No acute pelvic fracture or diastasis on single AP radiograph. Electronically Signed   By: Meda Klinefelter M.D.   On: 07/10/2023 15:00   DG Hand Complete Right  Result  Date: 07/10/2023 CLINICAL DATA:  post reduction of the thumb EXAM: RIGHT HAND - COMPLETE 3+ VIEW COMPARISON:  Same day radiograph FINDINGS: Interval reduction of the first MCP into anatomic position. There is associated soft tissue swelling. Previously described adjacent osseous flecks are less discretely visible on reduction film. Severe degenerative changes of the first CMC. Degenerative changes throughout the fingers most pronounced at the second, third and fifth DIPs. Vascular calcifications. IMPRESSION: Interval reduction of the first MCP into anatomic position.  Electronically Signed   By: Meda Klinefelter M.D.   On: 07/10/2023 14:59   DG Knee Complete 4 Views Right  Result Date: 07/10/2023 CLINICAL DATA:  Fall with bruising and pain EXAM: RIGHT KNEE - COMPLETE 4+ VIEW COMPARISON:  None Available. FINDINGS: No acute fracture or dislocation. Moderate joint space narrowing of the lateral compartment with osteophyte formation. Mild joint space narrowing and osteophyte formation of the medial compartment. Moderate joint space narrowing and osteophyte formation of the patellofemoral compartment. No area of erosion or osseous destruction. No unexpected radiopaque foreign body. Vascular calcifications. Small joint effusion. IMPRESSION: 1. No acute fracture or dislocation. 2. Small joint effusion. 3. Tricompartmental degenerative changes. Electronically Signed   By: Meda Klinefelter M.D.   On: 07/10/2023 14:57   DG Knee Complete 4 Views Left  Result Date: 07/10/2023 CLINICAL DATA:  Fall with pain and bruising EXAM: LEFT KNEE - COMPLETE 4+ VIEW; LEFT FEMUR PORTABLE 1 VIEW COMPARISON:  None Available. FINDINGS: No acute fracture or dislocation. Mild tricompartmental degenerative changes most pronounced in the medial compartment. No area of erosion or osseous destruction. No unexpected radiopaque foreign body. Vascular calcifications. Chondrocalcinosis. Rounded area of soft tissue density along the lateral LEFT thigh likely reflecting a hematoma. It spans approximately 7.5 cm IMPRESSION: 1. No acute fracture or dislocation. 2. Rounded area of soft tissue density along the lateral LEFT thigh likely reflecting a hematoma. Electronically Signed   By: Meda Klinefelter M.D.   On: 07/10/2023 14:56   DG Femur Portable 1 View Left  Result Date: 07/10/2023 CLINICAL DATA:  Fall with pain and bruising EXAM: LEFT KNEE - COMPLETE 4+ VIEW; LEFT FEMUR PORTABLE 1 VIEW COMPARISON:  None Available. FINDINGS: No acute fracture or dislocation. Mild tricompartmental degenerative  changes most pronounced in the medial compartment. No area of erosion or osseous destruction. No unexpected radiopaque foreign body. Vascular calcifications. Chondrocalcinosis. Rounded area of soft tissue density along the lateral LEFT thigh likely reflecting a hematoma. It spans approximately 7.5 cm IMPRESSION: 1. No acute fracture or dislocation. 2. Rounded area of soft tissue density along the lateral LEFT thigh likely reflecting a hematoma. Electronically Signed   By: Meda Klinefelter M.D.   On: 07/10/2023 14:56   DG Hand Complete Right  Result Date: 07/10/2023 CLINICAL DATA:  Status post fall. EXAM: RIGHT HAND - COMPLETE 3+ VIEW COMPARISON:  None Available. FINDINGS: Mild diffuse osteopenia. There is lateral and volar dislocation of the first metacarpal phalangeal joint. Tiny densities identified adjacent to the head of the first metacarpal bone which may represent small chip fragments versus chronic soft tissue calcifications. Advanced degenerative changes at the basilar joint. Mild DIP and PIP joint osteoarthritis. IMPRESSION: 1. Lateral and volar dislocation of the first metacarpophalangeal joint. 2. Advanced basilar joint osteoarthritis. Electronically Signed   By: Signa Kell M.D.   On: 07/10/2023 12:31    Procedures Reduction of dislocation  Date/Time: 07/10/2023 10:59 PM  Performed by: Achille Rich, PA-C Authorized by: Achille Rich, PA-C  Consent: Verbal consent obtained.  Risks and benefits: risks, benefits and alternatives were discussed Consent given by: patient Required items: required blood products, implants, devices, and special equipment available Patient identity confirmed: verbally with patient Comments: Right thumb reduction of dislocation - There is no abrasion or laceration to the area.  Closed dislocation.  Area was cleansed with alcohol swab and allowed to dry.  I applied a digital block to the area with 1% lidocaine.  Aspiration negative for blood and continue with  incremental injection.  Around 5 cc were used.  Anesthesia achieved with this.  Was able to relocate the area with traction.  The patient has brisk cap refill still present to the distal tip.  She is able to flex and extend at the interphalangeal joint.  I did not encourage any other movement for fear of dislocation again.  X-ray obtained after postreduction and was successful.      Medications Ordered in ED Medications  morphine (PF) 4 MG/ML injection 4 mg (4 mg Intravenous Given 07/10/23 1241)  lidocaine (PF) (XYLOCAINE) 1 % injection 10 mL (10 mLs Other Given by Other 07/10/23 1300)  iohexol (OMNIPAQUE) 300 MG/ML solution 100 mL (100 mLs Intravenous Contrast Given 07/10/23 1534)  morphine (PF) 4 MG/ML injection 4 mg (4 mg Intravenous Given 07/10/23 1714)  acetaminophen (TYLENOL) tablet 1,000 mg (1,000 mg Oral Given 07/10/23 2008)  ibuprofen (ADVIL) tablet 600 mg (600 mg Oral Given 07/10/23 2008)    ED Course/ Medical Decision Making/ A&P    Medical Decision Making Amount and/or Complexity of Data Reviewed Labs: ordered. Radiology: ordered.  Risk OTC drugs. Prescription drug management.   77 y.o. female presents to the ER for evaluation of mechanical fall without LOC. Differential diagnosis includes but is not limited to trauma. Vital signs mildly elevated blood pressure otherwise unremarkable. Physical exam as noted above.   The patient has an obviously dislocated/deformed right thumb.  Will order x-ray imaging of that.  She does have some contusions and bruising to the anterior of the left and right knee however does have flexion extension present and is neurovascular intact distally.  Does have some tenderness to palpation to the more medial left thigh.  She does have a hematoma to the back of her left leg that she reports has been present for "years and years".  She reports that she is followed up with people but "no one can tell her what to do about it".  The patient has bruising  and swollen upper lip to the face.  No pain with opening of the jaw.  She does not have any hemotympanums or any septal hematoma present.  I do not appreciate any lip laceration.  The gum and incision appear intact.  Nontender to palpation.  Does have some bruising to the left shoulder but no midline tenderness palpation.  Given age, and some questionable memory however it is not baseline per husband, will order pan scan.  I independently reviewed and interpreted the patient's labs.  CBC does show slight elevation however could be acute phase reaction.  Mild anemia with hemoglobin 11.3.  Appears to be around baseline.  CMP did show mild decrease of bicarb of 21 however normal anion gap.  Glucose at 128.  Mildly decreased calcium and albumin otherwise no electrolyte or LFT abnormality.  Urinalysis does show 5 greater than 500 glucose with 15 ketones present and many bacteria however there is no white blood cells, red blood cells, nitrites, or leukocytes present.  Will add on urine culture.  Image  read per radiologists' reads/reports as listed: CT head, cervical spine, maxillofacial  1. No acute intracranial pathology. 2. No displaced fractures or dislocations of the facial bones. 3. Laceration of the upper lip with subcutaneous emphysema. 4. No fracture or static subluxation of the cervical spine. 5. Moderate multilevel cervical disc degenerative disease.   CT Chest, abdomen, pelvis with T & L spine  1. No CT evidence of acute traumatic injury to the chest, abdomen, or pelvis. 2. No fracture or dislocation of the thoracic or lumbar spine. 3. Sigmoid diverticulosis without evidence of acute diverticulitis. 4. Coronary artery disease. Aortic Atherosclerosis.  X-ray of the right hand shows lateral volar dislocation of the first MCP joint.  Advanced basilar joint osteoarthritis.  X-ray of the left femur shows no acute fracture or dislocation, rounded area of soft tissue density along the lateral left thigh  likely reflecting hematoma.  X-ray of the left knee shows no acute fracture or dislocation.  X-ray of the right knee shows no acute fracture or dislocation however there is a small joint effusion and tricompartmental degenerative changes.  Given radiologist read, I assessed the patient again and I still do not see any laceration into the gumline, upper lip, or nose area.  There is some bruising however do not appear to see any cut or laceration.  Skin is intact.  Please see procedure note for successful reduction of the thumb.  From the imaging, question if there is any avulsion fractures present.  She has some questionable snuffbox tenderness as she sometimes has it upon palpation and then other times does not, but does not have any pain with flexion or extension of her wrist.  Will still treat with thumb spica cast and have her follow-up with hand surgery for rule out for potential scaphoid fracture.  Thumb spica splint was placed.  She is neurovascular intact distally after splint placement.  The patient has been ambulatory with her cane at baseline.  She reports that she is feeling better and is anticipating being discharged home.  Thankfully, no significant acute changes seen during CT imaging.  Her thumb was successfully reduced and she was placed in a thumb spica splint.  I discussed with him about pain management with Tylenol and ibuprofen.  Discussed with them that given her fall I have hesitation about giving her narcotic pain medication.  She reports that she does not want any narcotic pain medication and would like to continue with pain management with Tylenol ibuprofen.  I think this is reasonable.  I did recommend that she follow-up with her primary care doctor within the next week for reevaluation.  We discussed the RICE method for her knees and recommended ice for face.  Husband so reports the patient is at her baseline.  Given this is a mechanical fall, I do not think there was syncope or  other reason for this.  She denies any loss consciousness.  Husband has been reports that she did not have any syncope.  For the hematoma (?) on the posterior aspect of her left thigh, this is not a new finding.  Reports has been there for years.  Will recommend she follow-up with [facility] surgery for evaluation of that.  Per staff, she has been ambulatory at bedside with her cane.  Has reports that she does have a walker at home.  I discussed with her to be careful given the splint.  Recommended cast cover as well for showering.  She is stable for discharge home with  close outpatient PCP and hand surgery follow-up.  We discussed the results of the labs/imaging. The plan is follow-up with PCP, follow-up with hand surgery, RICE method. We discussed strict return precautions and red flag symptoms. The patient verbalized their understanding and agrees to the plan. The patient is stable and being discharged home in good condition.  Portions of this report may have been transcribed using voice recognition software. Every effort was made to ensure accuracy; however, inadvertent computerized transcription errors may be present.   I discussed this case with my attending physician who cosigned this note including patient's presenting symptoms, physical exam, and planned diagnostics and interventions. Attending physician stated agreement with plan or made changes to plan which were implemented.   Attending physician assessed patient at bedside.  Final Clinical Impression(s) / ED Diagnoses Final diagnoses:  Dislocation of right thumb, initial encounter  Fall, initial encounter  Hematoma  Contusion of face, initial encounter  Acute pain of right knee  Acute pain of left knee    Rx / DC Orders ED Discharge Orders     None         Achille Rich, PA-C 07/10/23 2318    Achille Rich, PA-C 07/10/23 2318    Jacalyn Lefevre, MD 07/13/23 2032

## 2023-07-10 NOTE — ED Triage Notes (Addendum)
The patient slipped and fell. She hit her face. Her left thumb is deformed and swollen. No LOC. She does not take a blood thinner.

## 2023-07-10 NOTE — ED Notes (Signed)
Pt assisted to [facility] and able to ambulate to sink with SBA

## 2023-07-13 DIAGNOSIS — S5331XA Traumatic rupture of right ulnar collateral ligament, initial encounter: Secondary | ICD-10-CM | POA: Diagnosis not present

## 2023-07-13 DIAGNOSIS — S63114A Dislocation of metacarpophalangeal joint of right thumb, initial encounter: Secondary | ICD-10-CM | POA: Diagnosis not present

## 2023-07-13 LAB — URINE CULTURE: Culture: 70000 — AB

## 2023-07-14 ENCOUNTER — Telehealth (HOSPITAL_BASED_OUTPATIENT_CLINIC_OR_DEPARTMENT_OTHER): Payer: Self-pay

## 2023-07-14 NOTE — Telephone Encounter (Signed)
Post ED Visit - Positive Culture Follow-up: Successful Patient Follow-Up  Culture assessed and recommendations reviewed by:  []  Enzo Bi, Pharm.D. []  Celedonio Miyamoto, Pharm.D., BCPS AQ-ID [x]  Wilburn Cornelia, Pharm.D., BCPS []  Georgina Pillion, Pharm.D., BCPS []  [place], [address].D., BCPS, AAHIVP []  Estella Husk, Pharm.D., BCPS, AAHIVP []  Lysle Pearl, PharmD, BCPS []  Phillips Climes, PharmD, BCPS []  Agapito Games, PharmD, BCPS []  Verlan Friends, PharmD  Positive urine culture  [x]  Patient discharged without antimicrobial prescription and treatment is now indicated []  Organism is resistant to prescribed ED discharge antimicrobial []  Patient with positive blood cultures  Changes discussed with ED provider: Sabra Heck, PA-C New antibiotic prescription Cefadroxil 500 mg po BID x 5 days  Called to [facility] on [facility] patient, date 07/14/2023, time 2:40 pm   Sandria Senter 07/14/2023, 2:44 PM

## 2023-07-14 NOTE — Progress Notes (Signed)
ED Antimicrobial Stewardship Positive Culture Follow Up   Pamela [name] is an 77 y.o. female who presented to [facility] on 07/10/2023 with a chief complaint of  Chief Complaint  Patient presents with   Fall   Head Injury   Finger Injury    Recent Results (from the past 720 hour(s))  Urine Culture     Status: Abnormal   Collection Time: 07/10/23  3:36 PM   Specimen: Urine, Clean Catch  Result Value Ref Range Status   Specimen Description   Final    URINE, CLEAN CATCH Performed at [facility], [address] [place], [place] [address]    Special Requests   Final    NONE Performed at [facility], [address] [place], [place] [address]    Culture 70,000 COLONIES/mL KLEBSIELLA PNEUMONIAE (A)  Final   Report Status 07/13/2023 FINAL  Final   Organism ID, Bacteria KLEBSIELLA PNEUMONIAE (A)  Final      Susceptibility   Klebsiella pneumoniae - MIC*    AMPICILLIN >=32 RESISTANT Resistant     CEFAZOLIN <=4 SENSITIVE Sensitive     CEFEPIME <=0.12 SENSITIVE Sensitive     CEFTRIAXONE <=0.25 SENSITIVE Sensitive     CIPROFLOXACIN <=0.25 SENSITIVE Sensitive     GENTAMICIN <=1 SENSITIVE Sensitive     IMIPENEM <=0.25 SENSITIVE Sensitive     NITROFURANTOIN 128 RESISTANT Resistant     TRIMETH/SULFA <=20 SENSITIVE Sensitive     AMPICILLIN/SULBACTAM 8 SENSITIVE Sensitive     PIP/TAZO <=4 SENSITIVE Sensitive ug/mL    * 70,000 COLONIES/mL KLEBSIELLA PNEUMONIAE   [x]  Patient discharged originally without antimicrobial agent and treatment is now indicated  New antibiotic prescription: Cefadroxil 500mg  po BID x 5 days  ED Provider: Sabra Heck, PA-C   Doristine Counter, PharmD, BCPS 07/14/2023, 12:05 PM Clinical Pharmacist Monday - Friday phone -  [phone] Saturday - Sunday phone - [phone]

## 2023-07-18 DIAGNOSIS — Z0000 Encounter for general adult medical examination without abnormal findings: Secondary | ICD-10-CM | POA: Diagnosis not present

## 2023-07-18 DIAGNOSIS — E559 Vitamin D deficiency, unspecified: Secondary | ICD-10-CM | POA: Diagnosis not present

## 2023-07-18 DIAGNOSIS — E785 Hyperlipidemia, unspecified: Secondary | ICD-10-CM | POA: Diagnosis not present

## 2023-07-18 DIAGNOSIS — N183 Chronic kidney disease, stage 3 unspecified: Secondary | ICD-10-CM | POA: Diagnosis not present

## 2023-07-18 DIAGNOSIS — D649 Anemia, unspecified: Secondary | ICD-10-CM | POA: Diagnosis not present

## 2023-07-18 DIAGNOSIS — I10 Essential (primary) hypertension: Secondary | ICD-10-CM | POA: Diagnosis not present

## 2023-07-18 DIAGNOSIS — E039 Hypothyroidism, unspecified: Secondary | ICD-10-CM | POA: Diagnosis not present

## 2023-07-18 DIAGNOSIS — E1142 Type 2 diabetes mellitus with diabetic polyneuropathy: Secondary | ICD-10-CM | POA: Diagnosis not present

## 2023-07-18 DIAGNOSIS — Z0189 Encounter for other specified special examinations: Secondary | ICD-10-CM | POA: Diagnosis not present

## 2023-07-19 DIAGNOSIS — S63114A Dislocation of metacarpophalangeal joint of right thumb, initial encounter: Secondary | ICD-10-CM | POA: Diagnosis not present

## 2023-07-19 DIAGNOSIS — S63641A Sprain of metacarpophalangeal joint of right thumb, initial encounter: Secondary | ICD-10-CM | POA: Diagnosis not present

## 2023-07-19 DIAGNOSIS — X58XXXA Exposure to other specified factors, initial encounter: Secondary | ICD-10-CM | POA: Diagnosis not present

## 2023-07-19 DIAGNOSIS — Y999 Unspecified external cause status: Secondary | ICD-10-CM | POA: Diagnosis not present

## 2023-07-19 DIAGNOSIS — S63418A Traumatic rupture of collateral ligament of other finger at metacarpophalangeal and interphalangeal joint, initial encounter: Secondary | ICD-10-CM | POA: Diagnosis not present

## 2023-07-25 DIAGNOSIS — Z0000 Encounter for general adult medical examination without abnormal findings: Secondary | ICD-10-CM | POA: Diagnosis not present

## 2023-07-25 DIAGNOSIS — Z1331 Encounter for screening for depression: Secondary | ICD-10-CM | POA: Diagnosis not present

## 2023-07-25 DIAGNOSIS — R269 Unspecified abnormalities of gait and mobility: Secondary | ICD-10-CM | POA: Diagnosis not present

## 2023-07-25 DIAGNOSIS — I700 Atherosclerosis of aorta: Secondary | ICD-10-CM | POA: Diagnosis not present

## 2023-07-25 DIAGNOSIS — I129 Hypertensive chronic kidney disease with stage 1 through stage 4 chronic kidney disease, or unspecified chronic kidney disease: Secondary | ICD-10-CM | POA: Diagnosis not present

## 2023-07-25 DIAGNOSIS — E785 Hyperlipidemia, unspecified: Secondary | ICD-10-CM | POA: Diagnosis not present

## 2023-07-25 DIAGNOSIS — E669 Obesity, unspecified: Secondary | ICD-10-CM | POA: Diagnosis not present

## 2023-07-25 DIAGNOSIS — E1142 Type 2 diabetes mellitus with diabetic polyneuropathy: Secondary | ICD-10-CM | POA: Diagnosis not present

## 2023-07-25 DIAGNOSIS — N1831 Chronic kidney disease, stage 3a: Secondary | ICD-10-CM | POA: Diagnosis not present

## 2023-07-25 DIAGNOSIS — E039 Hypothyroidism, unspecified: Secondary | ICD-10-CM | POA: Diagnosis not present

## 2023-07-25 DIAGNOSIS — F411 Generalized anxiety disorder: Secondary | ICD-10-CM | POA: Diagnosis not present

## 2023-07-25 DIAGNOSIS — Z1339 Encounter for screening examination for other mental health and behavioral disorders: Secondary | ICD-10-CM | POA: Diagnosis not present

## 2023-07-28 ENCOUNTER — Telehealth: Payer: Self-pay | Admitting: Cardiovascular Disease

## 2023-07-28 MED ORDER — CARVEDILOL 12.5 MG PO TABS: 12.5000 mg | ORAL_TABLET | Freq: Two times a day (BID) | ORAL | 1 refills | Status: DC

## 2023-07-28 NOTE — Telephone Encounter (Signed)
Pt's medication was sent to pt's pharmacy as requested. Confirmation received.  °

## 2023-07-28 NOTE — Telephone Encounter (Signed)
*  STAT* If patient is at the pharmacy, call can be transferred to refill team.   1. Which medications need to be refilled? (please list name of each medication and dose if known) carvedilol (COREG) 12.5 MG tablet    2. Would you like to learn more about the convenience, safety, & potential cost savings by using the [facility]? No      3. Are you open to using the [facility] (Type [facility]. No ).   4. Which pharmacy/location (including street and city if local pharmacy) is medication to be sent to? [id number] - [place], [place] - [address] AT [facility] OF [address]    5. Do they need a 30 day or 90 day supply? 2 week supply

## 2023-08-03 DIAGNOSIS — S5330XD Traumatic rupture of unspecified ulnar collateral ligament, subsequent encounter: Secondary | ICD-10-CM | POA: Diagnosis not present

## 2023-08-03 DIAGNOSIS — Z4789 Encounter for other orthopedic aftercare: Secondary | ICD-10-CM | POA: Diagnosis not present

## 2023-08-03 DIAGNOSIS — M79644 Pain in right finger(s): Secondary | ICD-10-CM | POA: Diagnosis not present

## 2023-08-15 ENCOUNTER — Telehealth: Payer: Self-pay | Admitting: Gastroenterology

## 2023-08-15 MED ORDER — LOPERAMIDE HCL 2 MG PO CAPS: 4.0000 mg | ORAL_CAPSULE | ORAL | 0 refills | Status: DC | PRN

## 2023-08-15 MED ORDER — PANTOPRAZOLE SODIUM 40 MG PO TBEC: 40.0000 mg | DELAYED_RELEASE_TABLET | Freq: Two times a day (BID) | ORAL | 0 refills | Status: DC

## 2023-08-15 NOTE — Telephone Encounter (Signed)
Refills sent to pharmacy. 

## 2023-08-15 NOTE — Telephone Encounter (Signed)
Patient requesting medication refill for Protonix and [facility]ium [facility]. Please advise.

## 2023-08-15 NOTE — Telephone Encounter (Signed)
Patient husband called requesting to cancel the [facility]ill to [facility] and for the medication Protonix and Modium to be sent over to [facility] on [facility].Please advise

## 2023-08-19 DIAGNOSIS — E669 Obesity, unspecified: Secondary | ICD-10-CM | POA: Diagnosis not present

## 2023-08-19 DIAGNOSIS — Z1331 Encounter for screening for depression: Secondary | ICD-10-CM | POA: Diagnosis not present

## 2023-08-19 DIAGNOSIS — Z0000 Encounter for general adult medical examination without abnormal findings: Secondary | ICD-10-CM | POA: Diagnosis not present

## 2023-08-19 DIAGNOSIS — Z1339 Encounter for screening examination for other mental health and behavioral disorders: Secondary | ICD-10-CM | POA: Diagnosis not present

## 2023-08-19 DIAGNOSIS — I129 Hypertensive chronic kidney disease with stage 1 through stage 4 chronic kidney disease, or unspecified chronic kidney disease: Secondary | ICD-10-CM | POA: Diagnosis not present

## 2023-08-19 DIAGNOSIS — E559 Vitamin D deficiency, unspecified: Secondary | ICD-10-CM | POA: Diagnosis not present

## 2023-08-19 DIAGNOSIS — I700 Atherosclerosis of aorta: Secondary | ICD-10-CM | POA: Diagnosis not present

## 2023-08-19 DIAGNOSIS — E1142 Type 2 diabetes mellitus with diabetic polyneuropathy: Secondary | ICD-10-CM | POA: Diagnosis not present

## 2023-08-19 DIAGNOSIS — N1831 Chronic kidney disease, stage 3a: Secondary | ICD-10-CM | POA: Diagnosis not present

## 2023-08-19 DIAGNOSIS — R269 Unspecified abnormalities of gait and mobility: Secondary | ICD-10-CM | POA: Diagnosis not present

## 2023-08-19 DIAGNOSIS — E785 Hyperlipidemia, unspecified: Secondary | ICD-10-CM | POA: Diagnosis not present

## 2023-08-19 DIAGNOSIS — R82998 Other abnormal findings in urine: Secondary | ICD-10-CM | POA: Diagnosis not present

## 2023-08-19 DIAGNOSIS — R413 Other amnesia: Secondary | ICD-10-CM | POA: Diagnosis not present

## 2023-08-19 DIAGNOSIS — E039 Hypothyroidism, unspecified: Secondary | ICD-10-CM | POA: Diagnosis not present

## 2023-08-19 DIAGNOSIS — K219 Gastro-esophageal reflux disease without esophagitis: Secondary | ICD-10-CM | POA: Diagnosis not present

## 2023-08-30 ENCOUNTER — Encounter: Payer: Self-pay | Admitting: Podiatry

## 2023-08-30 ENCOUNTER — Ambulatory Visit (INDEPENDENT_AMBULATORY_CARE_PROVIDER_SITE_OTHER): Payer: Medicare Other | Admitting: Podiatry

## 2023-08-30 DIAGNOSIS — M79675 Pain in left toe(s): Secondary | ICD-10-CM

## 2023-08-30 DIAGNOSIS — B351 Tinea unguium: Secondary | ICD-10-CM | POA: Diagnosis not present

## 2023-08-30 DIAGNOSIS — M2142 Flat foot [pes planus] (acquired), left foot: Secondary | ICD-10-CM

## 2023-08-30 DIAGNOSIS — E1142 Type 2 diabetes mellitus with diabetic polyneuropathy: Secondary | ICD-10-CM | POA: Diagnosis not present

## 2023-08-30 DIAGNOSIS — M2011 Hallux valgus (acquired), right foot: Secondary | ICD-10-CM

## 2023-08-30 DIAGNOSIS — M2012 Hallux valgus (acquired), left foot: Secondary | ICD-10-CM

## 2023-08-30 DIAGNOSIS — M2141 Flat foot [pes planus] (acquired), right foot: Secondary | ICD-10-CM

## 2023-08-30 DIAGNOSIS — M2041 Other hammer toe(s) (acquired), right foot: Secondary | ICD-10-CM | POA: Diagnosis not present

## 2023-08-30 DIAGNOSIS — M2042 Other hammer toe(s) (acquired), left foot: Secondary | ICD-10-CM

## 2023-08-30 DIAGNOSIS — M79674 Pain in right toe(s): Secondary | ICD-10-CM | POA: Diagnosis not present

## 2023-08-31 DIAGNOSIS — Z4789 Encounter for other orthopedic aftercare: Secondary | ICD-10-CM | POA: Diagnosis not present

## 2023-08-31 DIAGNOSIS — M25641 Stiffness of right hand, not elsewhere classified: Secondary | ICD-10-CM | POA: Diagnosis not present

## 2023-09-04 NOTE — Progress Notes (Signed)
Subjective:  Patient ID: Pamela [name], female    DOB: April 26, 1946,  MRN: [id number]  77 y.o. female presents to clinic with  at risk foot care with history of diabetic neuropathy and painful elongated mycotic toenails 1-5 bilaterally which are tender when wearing enclosed shoe gear. Pain is relieved with periodic professional debridement. She is also here to pick up diabetic shoes. Chief Complaint  Patient presents with   Diabetes    Patient states she saw her pcp last week , patient A1c 6.4      Vascular Examination: Capillary refill time immediate b/l. Vascular status intact b/l with palpable pedal pulses. Pedal hair present b/l. No edema. No pain with calf compression b/l. Skin temperature gradient WNL b/l. No cyanosis or clubbing noted b/l LE.   Raised venous nodule, freely movable noted dorsal aspect right great toe along EHL tendon. Has blue hue, is nonpulsatile and asymptomatic.  Neurological Examination: Sensation grossly intact b/l with 10 gram monofilament. Vibratory sensation intact b/l.   Dermatological Examination: Pedal skin with normal turgor, texture and tone b/l.  No open wounds. No interdigital macerations.   Toenails 1-5 b/l thick, discolored, elongated with subungual debris and pain on dorsal palpation.   No hyperkeratotic nor porokeratotic lesions present on today's visit.  Musculoskeletal Examination: Muscle strength 5/5 to all lower extremity muscle groups bilaterally. No pain, crepitus or joint limitation noted with ROM bilateral LE. HAV with bunion deformity noted b/l LE. Pes planus deformity noted bilateral LE. Utilizes cane for ambulation assistance.  Radiographs: None  New problem(s): None   PCP is Adrian Prince, MD.  Allergies  Allergen Reactions   Demerol [Meperidine] Other (See Comments)    Unspecified    Macrobid [Nitrofurantoin Macrocrystal] Other (See Comments)    Unspecified    Macrobid [Nitrofurantoin]     Other reaction(s): hearing  loss in R ear   Meclizine Other (See Comments)    Unspecified    Sulfa Antibiotics Other (See Comments)    Unspecified     Review of Systems: Negative except as noted in the HPI.   Objective:  Pamela [name] is a pleasant 77 y.o. female in NAD. AAO x 3.. AAO x 3.  Vascular Examination: Vascular status intact b/l with palpable pedal pulses. CFT immediate b/l. No edema. No pain with calf compression b/l. Skin temperature gradient WNL b/l.   Neurological Examination: Sensation grossly intact b/l with 10 gram monofilament. Vibratory sensation intact b/l.   Dermatological Examination: Pedal skin with normal turgor, texture and tone b/l. Toenails 1-5 b/l thick, discolored, elongated with subungual debris and pain on dorsal palpation. No hyperkeratotic lesions noted b/l.   Musculoskeletal Examination: Muscle strength 5/5 to b/l LE. HAV with bunion deformity noted b/l LE. Pes planus deformity noted bilateral LE.  Radiographs: None  Last A1c:       No data to display           Assessment:   1. Pain due to onychomycosis of toenails of both feet   2. Polyneuropathy due to type 2 diabetes mellitus (HCC)    Plan:  -Consent given for treatment as described below: -Examined patient. -Dispensed one pair diabetic shoes and one pair total contact insoles. Shoes were appropriate fit with no heel slippage. Reviewed warranty information and patient signed all paperwork stating patient received shoes, insert(s)/filler(s), break-in instructions and warranty information. Patient instructed not to wear shoes outside unless completely satisfied. Patient related understanding.  -Mycotic toenails 1-5 bilaterally were debrided in length and girth  with sterile nail nippers and dremel without incident. -Patient/POA to call should there be question/concern in the interim.  Return in about 3 months (around 11/28/2023).  Freddie Breech, DPM      [facility] LOCATION: [address] [place] [address]                   Office [phone]   [facility] LOCATION: [address] [place], [place] [address] Office [phone]

## 2023-09-05 NOTE — Progress Notes (Signed)
Patient presents today to pick up diabetic shoes and insoles.  Patient was dispensed 1 pair of diabetic shoes and 2 pairs of foam casted diabetic insoles. Fit was satisfactory. Instructions for break-in and wear was reviewed and a copy was given to the patient.   Re-appointment for regularly scheduled diabetic foot care visits or if they should experience any trouble with the shoes or insoles.     Ordering one more pair of inserts for patient as only two came in  [employer], CFo, CFm

## 2023-09-07 ENCOUNTER — Other Ambulatory Visit: Payer: Self-pay | Admitting: Cardiovascular Disease

## 2023-09-08 ENCOUNTER — Telehealth: Payer: Self-pay | Admitting: Gastroenterology

## 2023-09-08 NOTE — Telephone Encounter (Signed)
Patient husband called and stated that his wife is needing to either increase her dose on the Omeprazole or a different medication. Patient husband stated that she is not able to control her bowel movements and she is having a lot of pain. Patient husband is requesting a call back. Please advise.

## 2023-09-09 ENCOUNTER — Telehealth: Payer: Self-pay | Admitting: Cardiovascular Disease

## 2023-09-09 NOTE — Telephone Encounter (Signed)
*  STAT* If patient is at the pharmacy, call can be transferred to refill team. ° ° °1. Which medications need to be refilled? (please list name of each medication and dose if known) ezetimibe (ZETIA) 10 MG tablet ° ° °2. Which pharmacy/location (including street and city if local pharmacy) is medication to be sent to?[facility] - [place], [place] - [address] ° °3. Do they need a 30 day or 90 day supply? 90 ° °

## 2023-09-09 NOTE — Telephone Encounter (Signed)
Pt's medication was already sent to pt's mail order pharmacy as requested. Confirmation received.

## 2023-09-09 NOTE — Telephone Encounter (Signed)
Returned patient call & she asked that I speak with her son. States for the last 3 months patient has been experiencing occasional abdominal pain & loose stools (mostly after meals) with some incontinence. She had surgery on her hand back in November after a fall. She's currently takes dicyclomine daily prn, pantoprazole 40 mg daily, and omeprazole daily (recently started by PCP). Rescheduled OV for 09/15/23 at 3:00 pm with Marchelle Folks, PA and advised that in the meantime to hold omeprazole, and take pantoprazole BID as prescribed and can increase dicyclomine up to QID PRN. Son verbalized all understanding, and had no further questions.

## 2023-09-15 ENCOUNTER — Ambulatory Visit
Admission: RE | Admit: 2023-09-15 | Discharge: 2023-09-15 | Disposition: A | Discharge status: Home / Self Care | Payer: Medicare Other | Source: Ambulatory Visit | Attending: Physician Assistant | Admitting: Physician Assistant

## 2023-09-15 ENCOUNTER — Ambulatory Visit: Payer: Medicare Other | Admitting: Physician Assistant

## 2023-09-15 ENCOUNTER — Other Ambulatory Visit: Payer: Medicare Other

## 2023-09-15 ENCOUNTER — Encounter: Payer: Self-pay | Admitting: Physician Assistant

## 2023-09-15 VITALS — BP 112/58 | HR 88 | Ht 68.0 in | Wt 182.2 lb

## 2023-09-15 DIAGNOSIS — K529 Noninfective gastroenteritis and colitis, unspecified: Secondary | ICD-10-CM | POA: Diagnosis not present

## 2023-09-15 DIAGNOSIS — A09 Infectious gastroenteritis and colitis, unspecified: Secondary | ICD-10-CM | POA: Diagnosis not present

## 2023-09-15 DIAGNOSIS — F419 Anxiety disorder, unspecified: Secondary | ICD-10-CM | POA: Diagnosis not present

## 2023-09-15 DIAGNOSIS — R109 Unspecified abdominal pain: Secondary | ICD-10-CM

## 2023-09-15 DIAGNOSIS — R634 Abnormal weight loss: Secondary | ICD-10-CM

## 2023-09-15 DIAGNOSIS — K219 Gastro-esophageal reflux disease without esophagitis: Secondary | ICD-10-CM | POA: Diagnosis not present

## 2023-09-15 DIAGNOSIS — K5900 Constipation, unspecified: Secondary | ICD-10-CM | POA: Diagnosis not present

## 2023-09-15 MED ORDER — LOPERAMIDE HCL 2 MG PO CAPS: 2.0000 mg | ORAL_CAPSULE | ORAL | 0 refills | Status: AC | PRN

## 2023-09-15 NOTE — Patient Instructions (Addendum)
 Your provider has requested that you have an abdominal x ray before leaving today. Please go to the basement floor to our Radiology department for the test.  Your provider has requested that you go to the basement level for lab work before leaving today. Press B on the elevator. The lab is located at the first door on the left as you exit the elevator.  - Can try loperamide /IMODIUM  2-4 mg initially,OR can do 2 mg in the morning and 2 mg before travel/leaving. with a maximum of 16 mg/day.   Can send in an anti spasm medication, DICYCLOMINE , to take as needed, UP TO 4 X A DAY   Can do trial of IBGard which is over the counter for AB pain-  Take 1-2 capsules once a day for maintence or twice a day during a flare  Can consider colestipol  for bile acid diarrhea IF THIS DOES NOT HELP   FODMAP stands for fermentable oligo-, di-, mono-saccharides and polyols (1). These are the scientific terms used to classify groups of carbs that are difficult for our body to digest and that are notorious for triggering digestive symptoms like bloating, gas, loose stools and stomach pain.   You can try low FODMAP diet  - start with eliminating just one column at a time that you feel may be a trigger for you. - the table at the very bottom contains foods that are low in FODMAPs   Sometimes trying to eliminate the FODMAP's from your diet is difficult or tricky, if you are stuggling with trying to do the elimination diet you can try an enzyme.  There is a food enzymes that you sprinkle in or on your food that helps break down the FODMAP. You can read more about the enzyme by going to this site: https://fodzyme.com/    Gastroparesis Please do small frequent meals like 4-6 meals a day.  Eat and drink liquids at separate times.  Avoid high fiber foods, cook your vegetables, avoid high fat food.  Suggest spreading protein throughout the day (greek yogurt, glucerna, soft meat, milk, eggs) Choose soft foods that  you can mash with a fork When you are more symptomatic, change to pureed foods foods and liquids.  Consider reading Living well with Gastroparesis by [facility] Gastroparesis is a condition in which food takes longer than normal to empty from the stomach. This condition is also known as delayed gastric emptying. It is usually a long-term (chronic) condition. There is no cure, but there are treatments and things that you can do at home to help relieve symptoms. Treating the underlying condition that causes gastroparesis can also help relieve symptoms What are the causes? In many cases, the cause of this condition is not known. Possible causes include: A hormone (endocrine) disorder, such as hypothyroidism or diabetes. A nervous system disease, such as Parkinson's disease or multiple sclerosis. Cancer, infection, or surgery that affects the stomach or vagus nerve. The vagus nerve runs from your chest, through your neck, and to the lower part of your brain. A connective tissue disorder, such as scleroderma. Certain medicines. What increases the risk? You are more likely to develop this condition if: You have certain disorders or diseases. These may include: An endocrine disorder. An eating disorder. Amyloidosis. Scleroderma. Parkinson's disease. Multiple sclerosis. Cancer or infection of the stomach or the vagus nerve. You have had surgery on your stomach or vagus nerve. You take certain medicines. You are female. What are the signs or symptoms? Symptoms of this  condition include: Feeling full after eating very little or a loss of appetite. Nausea, vomiting, or heartburn. Bloating of your abdomen. Inconsistent blood sugar (glucose) levels on blood tests. Unexplained weight loss. Acid from the stomach coming up into the esophagus (gastroesophageal reflux). Sudden tightening (spasm) of the stomach, which can be painful. Symptoms may come and go. Some people may not notice any  symptoms. How is this diagnosed? This condition is diagnosed with tests, such as: Tests that check how long it takes food to move through the stomach and intestines. These tests include: Upper gastrointestinal (GI) series. For this test, you drink a liquid that shows up well on X-rays, and then X-rays are taken of your intestines. Gastric emptying scintigraphy. For this test, you eat food that contains a small amount of radioactive material, and then scans are taken. Wireless capsule GI monitoring system. For this test, you swallow a pill (capsule) that records information about how foods and fluid move through your stomach. Gastric manometry. For this test, a tube is passed down your throat and into your stomach to measure electrical and muscular activity. Endoscopy. For this test, a long, thin tube with a camera and light on the end is passed down your throat and into your stomach to check for problems in your stomach lining. Ultrasound. This test uses sound waves to create images of the inside of your body. This can help rule out gallbladder disease or pancreatitis as a cause of your symptoms. How is this treated? There is no cure for this condition, but treatment and home care may relieve symptoms. Treatment may include: Treating the underlying cause. Managing your symptoms by making changes to your diet and exercise habits. Taking medicines to control nausea and vomiting and to stimulate stomach muscles. Getting food through a feeding tube in the hospital. This may be done in severe cases. Having surgery to insert a device called a gastric electrical stimulator into your body. This device helps improve stomach emptying and control nausea and vomiting. Follow these instructions at home: Take over-the-counter and prescription medicines only as told by your health care provider. Follow instructions from your health care provider about eating or drinking restrictions. Your health care provider  may recommend that you: Eat smaller meals more often. Eat low-fat foods. Eat low-fiber forms of high-fiber foods. For example, eat cooked vegetables instead of raw vegetables. Have only liquid foods instead of solid foods. Liquid foods are easier to digest. Drink enough fluid to keep your urine pale yellow. Exercise as often as told by your health care provider. Keep all follow-up visits. This is important. Contact a health care provider if you: Notice that your symptoms do not improve with treatment. Have new symptoms. Get help right away if you: Have severe pain in your abdomen that does not improve with treatment. Have nausea that is severe or does not go away. Vomit every time you drink fluids. Summary Gastroparesis is a long-term (chronic) condition in which food takes longer than normal to empty from the stomach. Symptoms include nausea, vomiting, heartburn, bloating of your abdomen, and loss of appetite. Eating smaller portions, low-fat foods, and low-fiber forms of high-fiber foods may help you manage your symptoms. Get help right away if you have severe pain in your abdomen. This information is not intended to replace advice given to you by your health care provider. Make sure you discuss any questions you have with your health care provider. Document Revised: 01/07/2020 Document Reviewed: 01/07/2020 Elsevier Patient Education  2021  Elsevier Inc.   Exocrine Pancreatic Insufficiency (EPI) CHECKING FOR THIS EPI is a condition in which your body doesn't provide enough pancreatic enzymes to properly digest your food (which can sometimes lead to some unpleasant digestive symptoms such as bloating, AB pain and loose stools, weight loss).   For many people, EPI is also a chronic lifelong condition.   That's why its so important to know what to expect with your EPI treatment, because with the right plan in place, EPI is manageable.  HOW DO I TAKE PANCREATIC ENZYMES?  Pancreatic  Enzyme Replacement therapy (Creon) is only available through prescription and cannot be substituted with over the counter alternatives.   Your doctor will personalize your dose based on your weight, diet, and symptoms.  The number of capsules you take per meal will depend on your prescribed dose.  Creon must be taken DURING every meal and snack.   Whether its a full meal or a snack, take Creon every time you eat. Remember to follow your treatment plan closely and take Creon exactly as prescribed - consistency is key!!  Dose adjustments are normal with Creon.  Your doctor will start your on the dose they deem appropriate for you.   Remember to follow up with your doctor after the first two weeks to ensure your therapy is effective and you're managing your treatment appropriately.  Please follow up with Alan Coombs, PA in April. We do not have a schedule at this time. Please contact our office in a month for April's schedule to be out.  _______________________________________________________  If your blood pressure at your visit was 140/90 or greater, please contact your primary care physician to follow up on this.  _______________________________________________________  If you are age 64 or older, your body mass index should be between 23-30. Your There is no height or weight on file to calculate BMI. If this is out of the aforementioned range listed, please consider follow up with your Primary Care Provider.  If you are age 5 or younger, your body mass index should be between 19-25. Your There is no height or weight on file to calculate BMI. If this is out of the aformentioned range listed, please consider follow up with your Primary Care Provider.   ________________________________________________________  The [facility] providers would like to encourage you to use MYCHART to communicate with providers for non-urgent requests or questions.  Due to long hold times on the telephone,  sending your provider a message by [facility] may be a faster and more efficient way to get a response.  Please allow 48 business hours for a response.  Please remember that this is for non-urgent requests.  _______________________________________________________

## 2023-09-15 NOTE — Progress Notes (Signed)
 09/15/2023 Pamela [name] [id number] 1946/03/27  Referring provider: Nichole Senior, MD Primary GI doctor: Dr. Kemp (Dr. Eda)  ASSESSMENT AND PLAN:  Chronic Diarrhea Increased frequency and urgency of bowel movements, particularly with stress and after eating. Diarrhea has worsened over the past 2-3 months. Recent weight loss noted. Recent CT scan 06/2023 showed no abnormalities in the intestines, stomach, pancreas, or spleen. Gallbladder removed many years ago. Colon 2022 negative microscopic colitis.  -Collect stool sample for C. diff and pancreatic elastase testing. -Order abdominal x-ray to rule out stool burden -Start loperamide  4mg  initially, then 2mg  after each unformed stool for 2 days, or 2mg  in the morning and 2mg  at night. -Consider trial of colestipol  if loperamide  is ineffective. -Consider dietary modifications, including trial off milk products and avoidance of certain additives. - most likely IBS-D with duration and symptoms  Gastroesophageal Reflux Disease (GERD) Reports heartburn and uses Tums at night. -Continue Tums as needed.  Anxiety Reports feeling nervous, particularly in medical settings, which exacerbates gastrointestinal symptoms. -Continue mirtazapine  7.5mg  at night for anxiety, appetite stimulation, and sleep aid. Consider taking it a bit earlier in the evening to allow time for it to take effect.  Abdominal Pain Reports abdominal pain, particularly when nervous. -Continue dicyclomine  2x/day for abdominal cramping. -Consider trial of IBgard for pain relief.  Follow-up in 2-3 months to reassess symptoms and response to treatment.         Patient Care Team: Nichole Senior, MD as PCP - General (Endocrinology) Burnard Debby LABOR, MD as PCP - Cardiology (Cardiology)  HISTORY OF PRESENT ILLNESS: 78 y.o. female with a past medical history of type 2 diabetes, hypertension, hyperlipidemia, GERD, chronic anemia with baseline hemoglobin 10-12,  history of H. pylori gastritis eradication study negative, ICH 08/2021 attributed to cerebral amyloid angiopathy and others listed below presents for evaluation of abdominal pain and diarrhea.  Endoscopic history: - Colonoscopy 2005: large IC valve but no polyps - Colonoscopy 2015: pancolonic diverticulosis and 2 tubular adenomas - Colonoscopy 12/16/20: diverticulosis, 2mm descending colon tubular adenoma - EGD 12/16/20: irregular Z-line, biopsies confirming reflux and H. pylori gastritis. No EOE. 07/09/2022 EGD colonoscopy with Dr. Eda due to chronic diarrhea, IDA.   Colonoscopy showed good bowel prep exam: Normal, diverticulosis entire examined colon.  Negative for microscopic colitis Endoscopy showed normal esophagus, hiatal hernia, diffuse mild inflammation with erythema friability and granularity in the gastric body normal duodenum.   Negative for celiac, showed reactive gastropathy negative for H. Pylori  Previously seen Dr. Eda 06/15/2022 for follow-up of diarrhea and abdominal discomfort.given  Trial of Imodium , possible Trial of eluxadoline 100 mg BID if not responding to Imodium  Failed trial of Xifaxan  07/10/2023 CT chest abdomen pelvis with contrast for trauma fall left-sided flank and back pain and bruising showed no acute traumatic injury, no fracture or dislocation, sigmoid diverticulosis without diverticulitis and coronary artery disease normal liver, status post cholecystectomy unremarkable pancreas, spleen  Discussed the use of AI scribe software for clinical note transcription with the patient, who gave verbal consent to proceed.  History of Present Illness   The patient, with a history of diabetes and a long-removed gallbladder, presents with chronic diarrhea that has worsened over the past two to three months. The patient reports having bowel movements one to two times a day, occasionally waking up due to urgency, and rarely experiencing fecal incontinence. The patient  denies having days without bowel movements and notes that the stools are often not solid. The patient also reports  occasional nausea and a need for Tums at night, suggesting possible heartburn.  The patient's symptoms appear to be exacerbated by anxiety and nervousness, often leading to avoidance of social events and appointments due to fear of bowel urgency. The patient also reports a recent significant weight loss, dropping from 192 to 182 pounds within a month.  The patient has been taking over-the-counter pain medication and a sleeping pill, as well as recently starting mirtazapine . The patient also reports a recent fall in October, which led to a CT scan that showed no abnormalities in the intestines, stomach, pancreas, or spleen.  The patient's diet does not include milk products, but does include artificial sweeteners and a significant amount of tea. The patient also reports feeling full quickly after starting to eat, often leading to uneaten meals.  The patient has been taking dicyclomine  twice a day for about a week to manage abdominal cramping, and reports some improvement. The patient has also been taking loperamide , which seems to help with the diarrhea. The patient denies any abdominal pain.     She  reports that she has never smoked. She has never used smokeless tobacco. She reports that she does not drink alcohol  and does not use drugs.  RELEVANT LABS AND IMAGING:  Results   LABS HbA1c: 6.1%  RADIOLOGY CT scan of chest, abdomen, and pelvis: Normal intestines, stomach, pancreas, and spleen (07/11/2023)  DIAGNOSTIC Colonoscopy: Normal (2023)      CBC    Component Value Date/Time   WBC 12.4 (H) 07/10/2023 1239   RBC 4.01 07/10/2023 1239   HGB 11.3 (L) 07/10/2023 1239   HGB 11.7 (L) 06/10/2022 0915   HGB 10.3 (L) 03/01/2017 1024   HCT 36.0 07/10/2023 1239   HCT 33.1 (L) 03/01/2017 1024   PLT 328 07/10/2023 1239   PLT 253 06/10/2022 0915   PLT 374 03/01/2017 1024    MCV 89.8 07/10/2023 1239   MCV 82 03/01/2017 1024   MCH 28.2 07/10/2023 1239   MCHC 31.4 07/10/2023 1239   RDW 15.1 07/10/2023 1239   RDW 16.1 (H) 03/01/2017 1024   LYMPHSABS 1.4 07/10/2023 1239   MONOABS 0.8 07/10/2023 1239   EOSABS 0.1 07/10/2023 1239   BASOSABS 0.1 07/10/2023 1239   Recent Labs    07/10/23 1239  HGB 11.3*    CMP     Component Value Date/Time   NA 139 07/10/2023 1353   NA 137 03/01/2017 1024   K 4.1 07/10/2023 1353   CL 107 07/10/2023 1353   CO2 21 (L) 07/10/2023 1353   GLUCOSE 128 (H) 07/10/2023 1353   BUN 17 07/10/2023 1353   BUN 17 03/01/2017 1024   CREATININE 0.82 07/10/2023 1353   CREATININE 1.02 (H) 03/11/2022 1315   CREATININE 0.69 09/26/2014 1607   CALCIUM  8.7 (L) 07/10/2023 1353   PROT 6.7 07/10/2023 1353   PROT 6.5 03/01/2017 1024   ALBUMIN 3.4 (L) 07/10/2023 1353   ALBUMIN 3.9 03/01/2017 1024   AST 18 07/10/2023 1353   AST 14 (L) 03/11/2022 1315   ALT 14 07/10/2023 1353   ALT 10 03/11/2022 1315   ALKPHOS 71 07/10/2023 1353   BILITOT 0.6 07/10/2023 1353   BILITOT 0.3 03/11/2022 1315   GFRNONAA >60 07/10/2023 1353   GFRNONAA 57 (L) 03/11/2022 1315   GFRAA 88 03/01/2017 1024      Latest Ref Rng & Units 07/10/2023    1:53 PM 03/11/2022    1:15 PM 01/07/2022   11:35 AM  Hepatic Function  Total Protein 6.5 - 8.1 g/dL 6.7  7.0  7.0   Albumin 3.5 - 5.0 g/dL 3.4  4.0  3.9   AST 15 - 41 U/L 18  14  14    ALT 0 - 44 U/L 14  10  10    Alk Phosphatase 38 - 126 U/L 71  69  83   Total Bilirubin 0.3 - 1.2 mg/dL 0.6  0.3  0.3       Current Medications:   Current Outpatient Medications (Endocrine & Metabolic):    empagliflozin (JARDIANCE) 10 MG TABS tablet, Take 5 mg by mouth daily.   glimepiride (AMARYL) 2 MG tablet, Take 2 mg by mouth daily before breakfast.   levothyroxine  (SYNTHROID , LEVOTHROID) 137 MCG tablet, Take 137 mcg by mouth daily before breakfast. One daily, 1 and 1/2 on Sunday   linaGLIPtin-metFORMIN HCl ER 2.01-999 MG TB24,  Take 2 tablets by mouth every morning.   Current Outpatient Medications (Cardiovascular):    amLODipine  (NORVASC ) 5 MG tablet, TAKE 1 TABLET DAILY   carvedilol  (COREG ) 12.5 MG tablet, Take 1 tablet (12.5 mg total) by mouth 2 (two) times daily with a meal.   ezetimibe  (ZETIA ) 10 MG tablet, TAKE 1 TABLET DAILY   hydrochlorothiazide  (HYDRODIURIL ) 25 MG tablet, TAKE 1 TABLET DAILY   lisinopril  (ZESTRIL ) 40 MG tablet, TAKE 1 TABLET DAILY   Current Outpatient Medications (Analgesics):    aspirin 81 MG EC tablet, 1 tablet  Current Outpatient Medications (Hematological):    ferrous sulfate  324 (65 Fe) MG TBEC, Take 1 tablet (325 mg total) by mouth every other day. (Patient taking differently: Take 1 tablet by mouth as needed.)  Current Outpatient Medications (Other):    calcium  carbonate (TUMS EX) 750 MG chewable tablet, Chew 1 tablet by mouth as needed for heartburn.   dicyclomine  (BENTYL ) 20 MG tablet, Take 1 tablet (20 mg total) by mouth 4 (four) times daily as needed for spasms.   famotidine  (PEPCID ) 20 MG tablet, Take 20 mg by mouth daily as needed for heartburn or indigestion.   loperamide  (IMODIUM ) 2 MG capsule, Take 1 capsule (2 mg total) by mouth as needed for diarrhea or loose stools.   metroNIDAZOLE  (METROCREAM ) 0.75 % cream, Apply 1 application  topically daily as needed (rosacea).   pantoprazole  (PROTONIX ) 40 MG tablet, Take 1 tablet (40 mg total) by mouth 2 (two) times daily. Needs office visit.   cholecalciferol (VITAMIN D) 25 MCG (1000 UNIT) tablet, 1,000 Units daily. (Patient not taking: Reported on 09/15/2023)   Coenzyme Q10 (CO Q 10) 100 MG CAPS, Take 300 mg by mouth daily. (Patient not taking: Reported on 09/15/2023)  Medical History:  Past Medical History:  Diagnosis Date   Age-related nuclear cataract of right eye 04/15/2020   Amyloidosis (HCC)    organ limited   Arthritis    Cataract    Colon polyps    Diabetes mellitus (HCC)    Diverticulosis    Gallstones    GERD  (gastroesophageal reflux disease)    Hearing loss    right ear   Heart murmur    Helicobacter pylori gastritis    Hiatal hernia    Hyperlipidemia    Hypertension    Hypothyroidism    Tachycardia    Tubular adenoma of colon    Vitreomacular adhesion of right eye 04/15/2020   Allergies:  Allergies  Allergen Reactions   Demerol [Meperidine] Other (See Comments)    Unspecified    Macrobid [Nitrofurantoin Macrocrystal] Other (See Comments)  Unspecified    Macrobid [Nitrofurantoin]     Other reaction(s): hearing loss in R ear   Meclizine Other (See Comments)    Unspecified    Sulfa Antibiotics Other (See Comments)    Unspecified      Surgical History:  She  has a past surgical history that includes Tonsillectomy; Cholecystectomy; Abdominal hysterectomy; Appendectomy; and Colonoscopy (12/16/2020). Family History:  Her family history includes Alzheimer's disease in her father; Anxiety disorder in her sister; Breast cancer in her paternal grandmother; CAD in her maternal grandfather; Cerebral aneurysm in her maternal grandfather and mother; Diabetes in her father; Hypertension in her father, mother, sister, and son.  REVIEW OF SYSTEMS  : All other systems reviewed and negative except where noted in the History of Present Illness.  PHYSICAL EXAM: There were no vitals taken for this visit. General Appearance: obese, in wheelchair, in no apparent distress. Head:   Normocephalic and atraumatic. Eyes:  sclerae anicteric,conjunctive pink  Respiratory: Respiratory effort normal, BS equal bilaterally without rales, rhonchi, wheezing. Cardio: RRR with no MRGs. Peripheral pulses intact.  Abdomen: Soft,  Obese ,active bowel sounds. No tenderness .SABRA No masses. Rectal: Not evaluated Musculoskeletal: Full ROM, Not tested gait. With edema. Skin:  Dry and intact without significant lesions or rashes Neuro: Alert and  oriented x4;  No focal deficits. Psych:  Cooperative. Normal mood and  affect.    Alan JONELLE Coombs, PA-C 4:13 PM

## 2023-09-20 ENCOUNTER — Other Ambulatory Visit: Payer: Medicare Other

## 2023-09-20 DIAGNOSIS — A09 Infectious gastroenteritis and colitis, unspecified: Secondary | ICD-10-CM | POA: Diagnosis not present

## 2023-09-21 DIAGNOSIS — M25641 Stiffness of right hand, not elsewhere classified: Secondary | ICD-10-CM | POA: Diagnosis not present

## 2023-09-21 LAB — C. DIFFICILE GDH AND TOXIN A/B
GDH ANTIGEN: NOT DETECTED
MICRO NUMBER:: 15927369
SPECIMEN QUALITY:: ADEQUATE
TOXIN A AND B: NOT DETECTED

## 2023-09-23 LAB — FECAL FAT, QUALITATIVE
Fat Qual Neutral, Stl: NORMAL
Fat Qual Total, Stl: NORMAL

## 2023-09-24 LAB — PANCREATIC ELASTASE, FECAL: Pancreatic Elastase-1, Stool: >800 ug/g (ref >200)

## 2023-09-29 ENCOUNTER — Telehealth: Payer: Self-pay | Admitting: Physician Assistant

## 2023-09-29 NOTE — Telephone Encounter (Signed)
Patient's son is returning you call

## 2023-09-29 NOTE — Telephone Encounter (Signed)
Patient son called and stated that he wanting more information on the purge his mother has to do. Patient son is requesting a call back to him at [phone] and if he is not able to reach his phone to please call his mother. Please advise.

## 2023-09-29 NOTE — Telephone Encounter (Signed)
Left message on machine to call back  

## 2023-09-30 IMAGING — MR MR HEAD W/O CM
12 of 13 series · 44 of 48 positions shown · non-contrast
Comparison: None.

CLINICAL DATA: Headache impossible intracranial hemorrhage

EXAM:
MRI HEAD WITHOUT CONTRAST
TECHNIQUE: Multiplanar, multiecho pulse sequences of the brain and surrounding
structures were obtained without intravenous contrast.

[Series 5: DWI · axial · 3.0mm · 0.92mm/px · z∈[-73,+86]mm · 8 of 108 slices shown (1 of 4)]
[im 1/108]
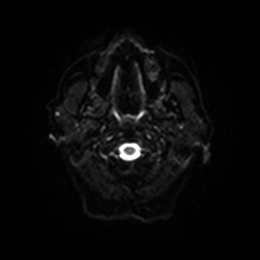
[im 16/108]
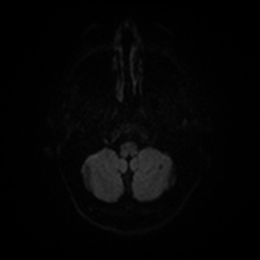
[im 31/108]
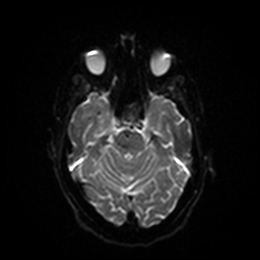
[im 46/108]
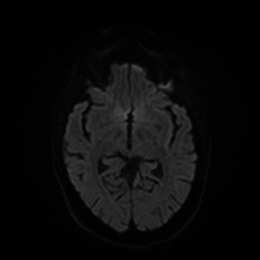
[im 62/108]
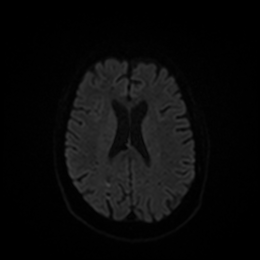
[im 77/108]
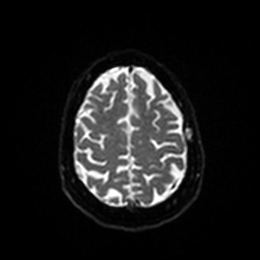
[im 92/108]
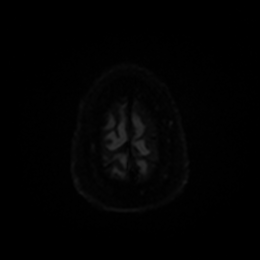
[im 108/108]
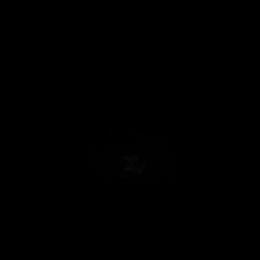

[Series 6: DWI · axial · 3.0mm · 0.92mm/px · z∈[-73,+86]mm · 4 of 54 slices shown (2 of 4)]
[im 1/54]
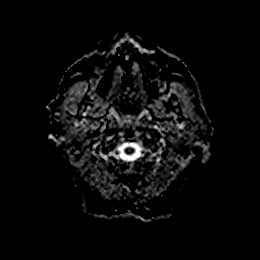
[im 18/54]
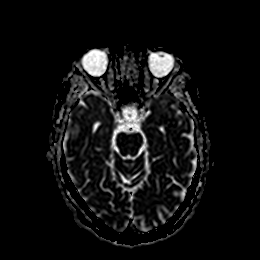
[im 36/54]
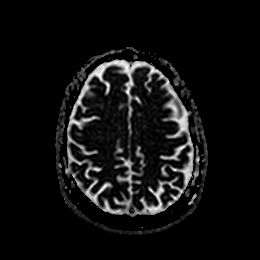
[im 54/54]
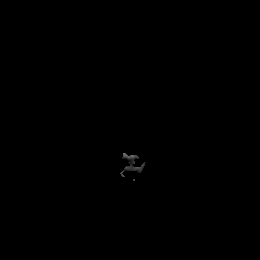

[Series 7: DWI · coronal · 4.0mm · 0.88mm/px · 5 of 76 slices shown (3 of 4)]
[im 1/76]
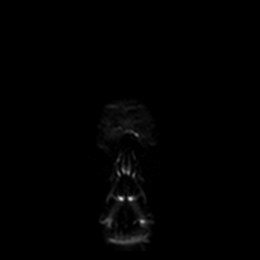
[im 19/76]
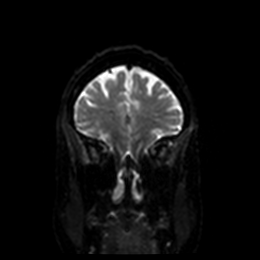
[im 38/76]
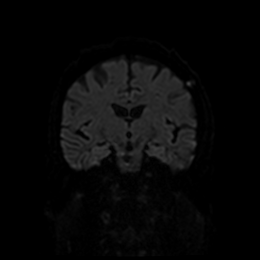
[im 57/76]
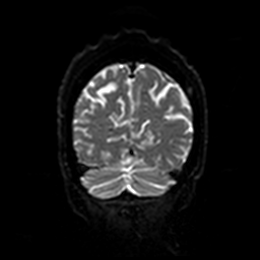
[im 76/76]
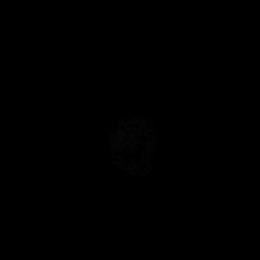

[Series 8: DWI · coronal · 4.0mm · 0.88mm/px · 3 of 38 slices shown (4 of 4)]
[im 1/38]
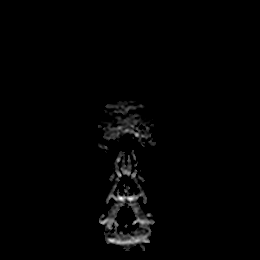
[im 19/38]
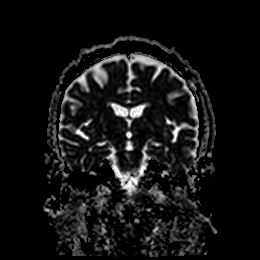
[im 38/38]
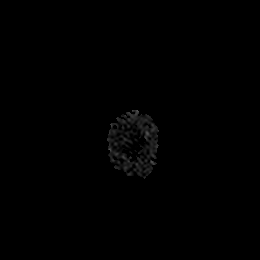

[Series 9: T1 · sagittal · 5.0mm · 0.75mm/px · 2 of 27 slices shown]
[im 1/27]
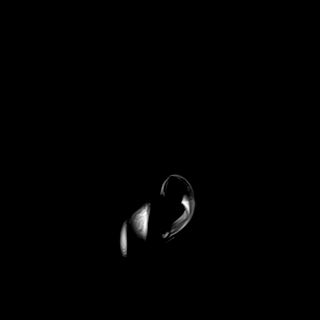
[im 27/27]
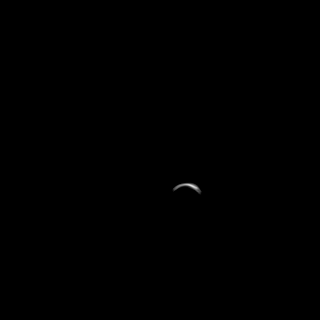

[Series 10: T2 · axial · 5.0mm · 0.75mm/px · z∈[-74,+88]mm · 2 of 28 slices shown (1 of 2)]
[im 1/28]
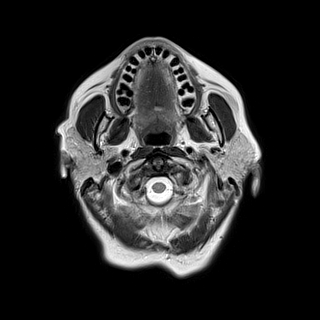
[im 28/28]
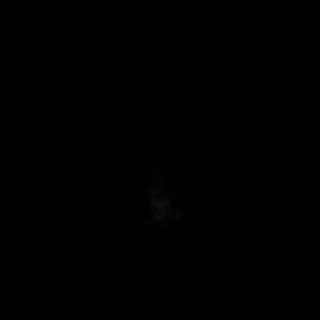

[Series 11: FLAIR · axial · 5.0mm · 0.47mm/px · z∈[-77,+85]mm · 2 of 28 slices shown]
[im 1/28]
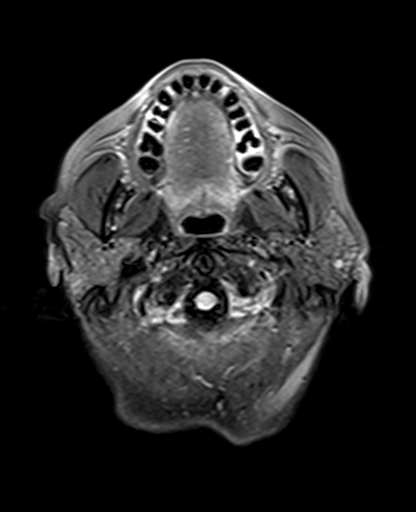
[im 28/28]
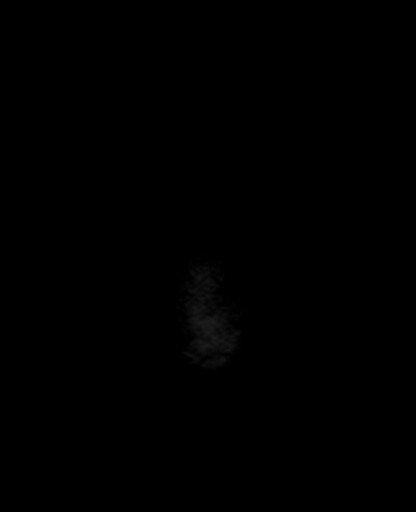

[Series 12: mag_images · axial · 3.0mm · 0.94mm/px · z∈[-76,+89]mm · 4 of 56 slices shown]
[im 1/56]
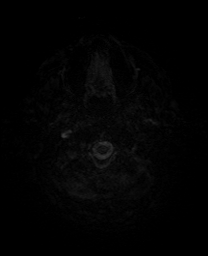
[im 19/56]
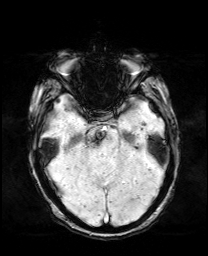
[im 37/56]
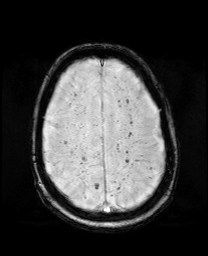
[im 56/56]
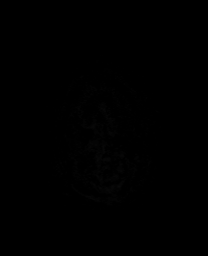

[Series 13: pha_images · axial · 3.0mm · 0.94mm/px · z∈[-76,+89]mm · 4 of 56 slices shown]
[im 1/56]
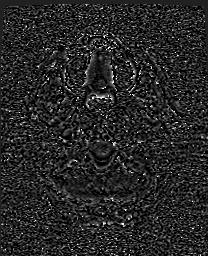
[im 19/56]
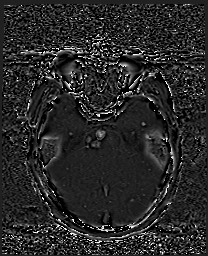
[im 37/56]
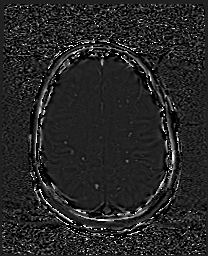
[im 56/56]
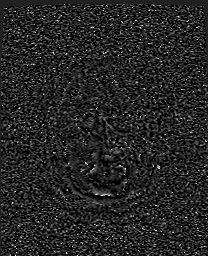

[Series 14: swi_images · axial · 3.0mm · 0.94mm/px · z∈[-76,+89]mm · 4 of 56 slices shown]
[im 1/56]
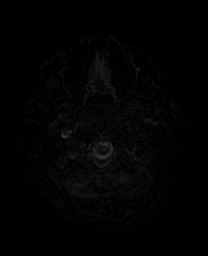
[im 19/56]
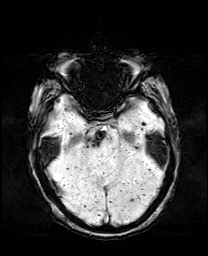
[im 37/56]
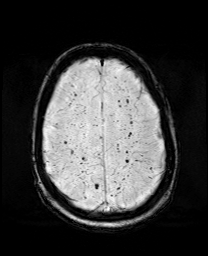
[im 56/56]
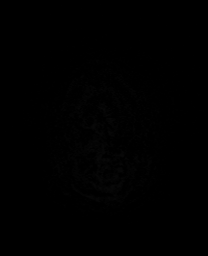

[Series 15: mip_images(sw) · axial · 24.0mm · 0.94mm/px · z∈[-65,+79]mm · 4 of 49 slices shown]
[im 1/49]
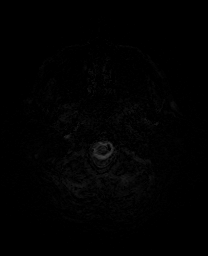
[im 17/49]
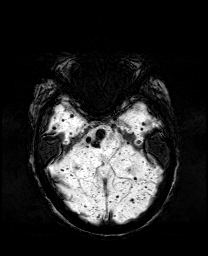
[im 33/49]
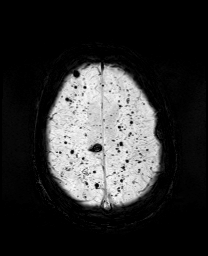
[im 49/49]
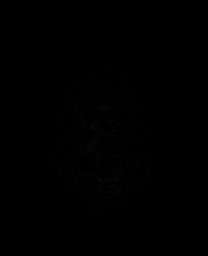

[Series 17: T2 · coronal · 5.0mm · 0.34mm/px · 2 of 33 slices shown (2 of 2)]
[im 1/33]
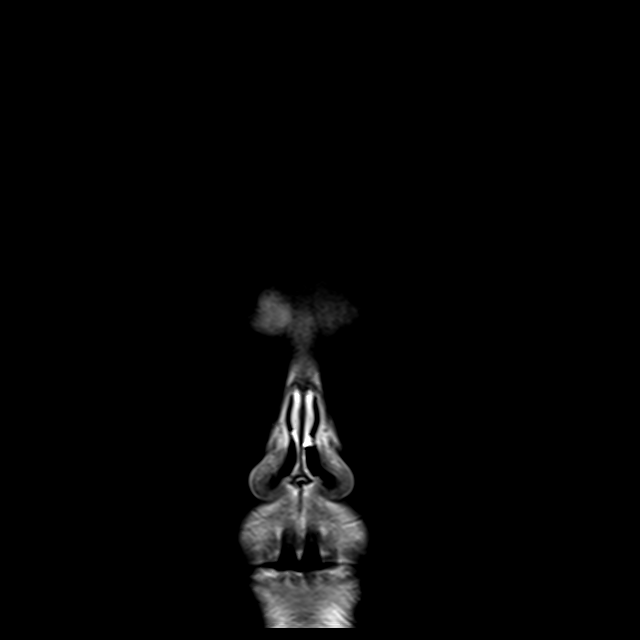
[im 33/33]
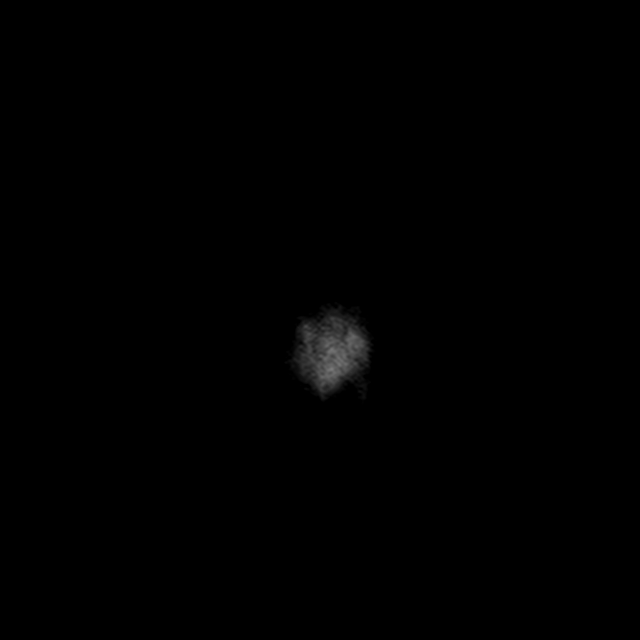

[44 of 48 positions shown; findings below may reference images not displayed]

FINDINGS: Brain: Punctate focus of acute hemorrhage in the right frontal lobe
is better demonstrated on the earlier head CT. There are innumerable
chronic microhemorrhages scattered throughout the brain in a mixed
central and peripheral pattern, most likely indicating amyloid
angiopathy. There is multifocal hyperintense T2-weighted signal
within the white matter. Parenchymal volume and CSF spaces are
normal. The midline structures are normal.

Vascular: Major flow voids are preserved.

Skull and upper cervical spine: Normal calvarium and skull base.
Visualized upper cervical spine and soft tissues are normal.

Sinuses/Orbits:No paranasal sinus fluid levels or advanced mucosal
thickening. No mastoid or middle ear effusion. Normal orbits.
IMPRESSION: 1. Innumerable chronic microhemorrhages in a mixed central and
peripheral pattern, consistent with amyloid angiopathy.
2. Punctate focus of acute hemorrhage in the right frontal lobe,
better demonstrated on the earlier head CT. Peripheral
intraparenchymal hemorrhage is in keeping with amyloid angiopathy.

## 2023-09-30 NOTE — Telephone Encounter (Signed)
Spoke with the pts son and discussed in detail how to complete a miralax bowel purge. 6-8 doses of miralax with gatorade and then miralax daily as well as benefiber and senokot.    The pt has been advised of the information and verbalized understanding.

## 2023-09-30 NOTE — Telephone Encounter (Signed)
PT son returning call to further discuss how to administer the purge. Please call [phone].

## 2023-10-03 ENCOUNTER — Encounter: Payer: Self-pay | Admitting: Neurology

## 2023-10-03 ENCOUNTER — Ambulatory Visit (INDEPENDENT_AMBULATORY_CARE_PROVIDER_SITE_OTHER): Payer: Medicare Other | Admitting: Neurology

## 2023-10-03 VITALS — BP 111/67 | HR 90 | Ht 68.0 in | Wt 177.2 lb

## 2023-10-03 DIAGNOSIS — G3184 Mild cognitive impairment, so stated: Secondary | ICD-10-CM | POA: Diagnosis not present

## 2023-10-03 DIAGNOSIS — I6781 Acute cerebrovascular insufficiency: Secondary | ICD-10-CM | POA: Diagnosis not present

## 2023-10-03 DIAGNOSIS — R413 Other amnesia: Secondary | ICD-10-CM

## 2023-10-03 MED ORDER — PREVAGEN 10 MG PO CAPS: 1.0000 | ORAL_CAPSULE | ORAL | 1 refills | Status: AC

## 2023-10-03 NOTE — Patient Instructions (Signed)
I long discussion with the patient and her husband and son regarding her memory loss and mild cognitive impairment discussed the need for further evaluation by checking memory panel labs, EEG, MRI scan of the brain, MRI of the brain and neck apoprotein profile for risk for Alzheimer's given her family history of the same.  I encouraged her to increase participation in cognitively challenging activities like solving crossword puzzles, playing bridge and sudoku.  We also discussed memory compensation strategies.  Continue aspirin for stroke prevention and maintain aggressive risk factor modification with strict control of hypertension with blood pressure goal below 130/90, lipids with LDL cholesterol goal below 70 mg percent and diabetes with hemoglobin A1c goal below 6.5%.  I also encouraged her to increase her healthy diet with lots of fruits, vegetables, cereals and whole grains.  We also discussed fall prevention precautions.  She will return for follow-up in the future in 6 months or call earlier if necessary.  Memory Compensation Strategies  Use "WARM" strategy.  W= write it down  A= associate it  R= repeat it  M= make a mental note  2.   You can keep a Glass blower/designer.  Use a 3-ring notebook with sections for the following: calendar, important names and phone numbers,  medications, doctors' names/phone numbers, lists/reminders, and a section to journal what you did  each day.   3.    Use a calendar to write appointments down.  4.    Write yourself a schedule for the day.  This can be placed on the calendar or in a separate section of the Memory Notebook.  Keeping a  regular schedule can help memory.  5.    Use medication organizer with sections for each day or morning/evening pills.  You may need help loading it  6.    Keep a basket, or pegboard by the door.  Place items that you need to take out with you in the basket or on the pegboard.  You may also want to  include a message board for  reminders.  7.    Use sticky notes.  Place sticky notes with reminders in a place where the task is performed.  For example: " turn off the  stove" placed by the stove, "lock the door" placed on the door at eye level, " take your medications" on  the bathroom mirror or by the place where you normally take your medications.  8.    Use alarms/timers.  Use while cooking to remind yourself to check on food or as a reminder to take your medicine, or as a  reminder to make a call, or as a reminder to perform another task, etc.  Fall Prevention in the Home, Adult Falls can cause injuries and affect people of all ages. There are many simple things that you can do to make your home safe and to help prevent falls. If you need it, ask for help making these changes. What actions can I take to prevent falls? General information Use good lighting in all rooms. Make sure to: Replace any light bulbs that burn out. Turn on lights if it is dark and use night-lights. Keep items that you use often in easy-to-reach places. Lower the shelves around your home if needed. Move furniture so that there are clear paths around it. Do not keep throw rugs or other things on the floor that can make you trip. If any of your floors are uneven, fix them. Add color or contrast paint  or tape to clearly mark and help you see: Grab bars or handrails. First and last steps of staircases. Where the edge of each step is. If you use a ladder or stepladder: Make sure that it is fully opened. Do not climb a closed ladder. Make sure the sides of the ladder are locked in place. Have someone hold the ladder while you use it. Know where your pets are as you move through your home. What can I do in the bathroom?     Keep the floor dry. Clean up any water that is on the floor right away. Remove soap buildup in the bathtub or shower. Buildup makes bathtubs and showers slippery. Use non-skid mats or decals on the floor of the bathtub  or shower. Attach bath mats securely with double-sided, non-slip rug tape. If you need to sit down while you are in the shower, use a non-slip stool. Install grab bars by the toilet and in the bathtub and shower. Do not use towel bars as grab bars. What can I do in the bedroom? Make sure that you have a light by your bed that is easy to reach. Do not use any sheets or blankets on your bed that hang to the floor. Have a firm bench or chair with side arms that you can use for support when you get dressed. What can I do in the kitchen? Clean up any spills right away. If you need to reach something above you, use a sturdy step stool that has a grab bar. Keep electrical cables out of the way. Do not use floor polish or wax that makes floors slippery. What can I do with my stairs? Do not leave anything on the stairs. Make sure that you have a light switch at the top and the bottom of the stairs. Have them installed if you do not have them. Make sure that there are handrails on both sides of the stairs. Fix handrails that are broken or loose. Make sure that handrails are as long as the staircases. Install non-slip stair treads on all stairs in your home if they do not have carpet. Avoid having throw rugs at the top or bottom of stairs, or secure the rugs with carpet tape to prevent them from moving. Choose a carpet design that does not hide the edge of steps on the stairs. Make sure that carpet is firmly attached to the stairs. Fix any carpet that is loose or worn. What can I do on the outside of my home? Use bright outdoor lighting. Repair the edges of walkways and driveways and fix any cracks. Clear paths of anything that can make you trip, such as tools or rocks. Add color or contrast paint or tape to clearly mark and help you see high doorway thresholds. Trim any bushes or trees on the main path into your home. Check that handrails are securely fastened and in good repair. Both sides of all  steps should have handrails. Install guardrails along the edges of any raised decks or porches. Have leaves, snow, and ice cleared regularly. Use sand, salt, or ice melt on walkways during winter months if you live where there is ice and snow. In the garage, clean up any spills right away, including grease or oil spills. What other actions can I take? Review your medicines with your health care provider. Some medicines can make you confused or feel dizzy. This can increase your chance of falling. Wear closed-toe shoes that fit well and support your  feet. Wear shoes that have rubber soles and low heels. Use a cane, walker, scooter, or crutches that help you move around if needed. Talk with your provider about other ways that you can decrease your risk of falls. This may include seeing a physical therapist to learn to do exercises to improve movement and strength. Where to find more information Centers for Disease Control and Prevention, STEADI: TonerPromos.no [employer] on Aging: BaseRingTones.pl National Institute on Aging: BaseRingTones.pl Contact a health care provider if: You are afraid of falling at home. You feel weak, drowsy, or dizzy at home. You fall at home. Get help right away if you: Lose consciousness or have trouble moving after a fall. Have a fall that causes a head injury. These symptoms may be an emergency. Get help right away. Call 911. Do not wait to see if the symptoms will go away. Do not drive yourself to the hospital. This information is not intended to replace advice given to you by your health care provider. Make sure you discuss any questions you have with your health care provider. Document Revised: 05/03/2022 Document Reviewed: 05/03/2022 Elsevier Patient Education  2024 [employer].

## 2023-10-03 NOTE — Progress Notes (Signed)
[facility] [address] [place]. [place] [address] [phone]       OFFICE CONSULT NOTE  [name] Date of Birth:  06-24-1946 Medical Record Number:  [id number]   Referring MD: Laurene Footman  Reason for Referral: Memory loss  HPI: [name] is a pleasant 78 year old Caucasian lady seen today for initial office consultation visit for memory loss.  She is accompanied by her husband and son.  History is obtained from them and review of electronic medical records.  I have personally reviewed pertinent available imaging films in PACS.  She has past medical history of hypertension, hyperlipidemia, hypothyroidism, diverticulosis, gallstones, arthritis.  Family has noticed for the last year or so particularly in the last few months she has become forgetful.  Her short-term memory is poor though she can remember remote things quite well.  She often repeats questions and needs to be told several times about her activities for that day.  She is still mostly independent in and manages all her activities except she needs a little help the stays in the bathroom because she slipped and fell in October and fractured her right thumb and her right hand is in a cast.  She does get irritable and agitated easily but can be redirected.  She is ambulating with a cane mostly but now has started using a wheelchair for long distances because of fear of falling.  There is no known history of strokes, TIA, seizures, migraines or significant neurological problems.  She does have a family history of brain hemorrhage in both her mother and grandmother who died from them.  There is also family history of dementia.  Patient had a CT scan of the head done on 07/10/2023 when she went to the ER for a fall and with showed no significant brain abnormalities.  She has not had any cognitive workup for reversible causes of memory loss.  She is currently doing outpatient physical therapy to improve her gait  balance and fear of falling.  Review of the brain imaging studies in PACS show that she had an MRI scan of the brain without contrast on 08/17/2021 which showed innumerable chronic microhemorrhages and is mixed central and peripheral distribution raising concern for amyloid angiopathy.  There was also a small punctate area of intraparenchymal hemorrhage in the right frontal lobe.  Given her family history of intracerebral hemorrhage she and family concerned about the risk for dementia.  Patient has not had any focal neurological episodes or history of TIA or stroke so far.  ROS:   14 system review of systems is positive for memory difficulties, repeating questions and fall, fracture right thumb and pain, agitation all other systems negative  PMH:  Past Medical History:  Diagnosis Date   Age-related nuclear cataract of right eye 04/15/2020   Amyloidosis (HCC)    organ limited   Arthritis    Cataract    Colon polyps    Diabetes mellitus (HCC)    Diverticulosis    Gallstones    GERD (gastroesophageal reflux disease)    Hearing loss    right ear   Heart murmur    Helicobacter pylori gastritis    Hiatal hernia    Hyperlipidemia    Hypertension    Hypothyroidism    Tachycardia    Tubular adenoma of colon    Vitreomacular adhesion of right eye 04/15/2020    Social History:  Social History   Socioeconomic History   Marital status: Married  Spouse name: Not on file   Number of children: 1   Years of education: Not on file   Highest education level: Not on file  Occupational History   Occupation: retired  Tobacco Use   Smoking status: Never   Smokeless tobacco: Never  Vaping Use   Vaping status: Never Used  Substance and Sexual Activity   Alcohol use: No   Drug use: No   Sexual activity: Not Currently    Partners: Male    Comment: intercourse- 20, partners-1, Married- 55 yrs  Other Topics Concern   Not on file  Social History Narrative   Right handed   Wear reader  glasses    Drinks 1-2 cups per day   Drinks unsweet tea daily          Lives with son Trey Paula and husband Deniece Portela   Social Drivers of Corporate investment banker Strain: Not on [employer] Insecurity: Not on file  Transportation Needs: Not on file  Physical Activity: Not on file  Stress: Not on file  Social Connections: Unknown (01/26/2022)   Received from [facility]   Social Network    Social Network: Not on file  Intimate Partner Violence: Unknown (12/18/2021)   Received from [facility]   HITS    Physically Hurt: Not on file    Insult or Talk Down To: Not on file    Threaten Physical Harm: Not on file    Scream or Curse: Not on file    Medications:   Current Outpatient Medications on File Prior to Visit  Medication Sig Dispense Refill   amLODipine (NORVASC) 5 MG tablet TAKE 1 TABLET DAILY 90 tablet 3   aspirin 81 MG EC tablet 1 tablet     calcium carbonate (TUMS EX) 750 MG chewable tablet Chew 1 tablet by mouth as needed for heartburn.     carvedilol (COREG) 12.5 MG tablet Take 1 tablet (12.5 mg total) by mouth 2 (two) times daily with a meal. 180 tablet 1   dicyclomine (BENTYL) 20 MG tablet Take 1 tablet (20 mg total) by mouth 4 (four) times daily as needed for spasms. 120 tablet 3   empagliflozin (JARDIANCE) 10 MG TABS tablet Take 5 mg by mouth daily.     ezetimibe (ZETIA) 10 MG tablet TAKE 1 TABLET DAILY 90 tablet 1   famotidine (PEPCID) 20 MG tablet Take 20 mg by mouth daily as needed for heartburn or indigestion.     glimepiride (AMARYL) 2 MG tablet Take 2 mg by mouth daily before breakfast.     hydrochlorothiazide (HYDRODIURIL) 25 MG tablet TAKE 1 TABLET DAILY 30 tablet 0   levothyroxine (SYNTHROID, LEVOTHROID) 137 MCG tablet Take 137 mcg by mouth daily before breakfast. One daily, 1 and 1/2 on Sunday     linaGLIPtin-metFORMIN HCl ER 2.01-999 MG TB24 Take 2 tablets by mouth every morning.      lisinopril (ZESTRIL) 40 MG tablet TAKE 1 TABLET  DAILY 30 tablet 0   loperamide (IMODIUM) 2 MG capsule Take 1 capsule (2 mg total) by mouth as needed for diarrhea or loose stools. 90 capsule 0   mirtazapine (REMERON) 7.5 MG tablet Take 7.5 mg by mouth at bedtime.     pantoprazole (PROTONIX) 40 MG tablet Take 1 tablet (40 mg total) by mouth 2 (two) times daily. Needs office visit. 180 tablet 0   cholecalciferol (VITAMIN D) 25 MCG (1000 UNIT) tablet 1,000 Units daily. (Patient not taking: Reported  on 09/15/2023)     Coenzyme Q10 (CO Q 10) 100 MG CAPS Take 300 mg by mouth daily. (Patient not taking: Reported on 09/15/2023)     ferrous sulfate 324 (65 Fe) MG TBEC Take 1 tablet (325 mg total) by mouth every other day. (Patient not taking: Reported on 10/03/2023) 30 tablet    metroNIDAZOLE (METROCREAM) 0.75 % cream Apply 1 application  topically daily as needed (rosacea). (Patient not taking: Reported on 10/03/2023)     No current facility-administered medications on file prior to visit.    Allergies:   Allergies  Allergen Reactions   Demerol [Meperidine] Other (See Comments)    Unspecified    Macrobid [Nitrofurantoin Macrocrystal] Other (See Comments)    Unspecified    Macrobid [Nitrofurantoin]     Other reaction(s): hearing loss in R ear   Meclizine Other (See Comments)    Unspecified    Sulfa Antibiotics Other (See Comments)    Unspecified     Physical Exam General: well developed, well nourished pleasant elderly Caucasian lady, seated, in no evident distress Head: head normocephalic and atraumatic.   Neck: supple with no carotid or supraclavicular bruits Cardiovascular: regular rate and rhythm, no murmurs Musculoskeletal: no deformity Skin:  no rash/petichiae Vascular:  Normal pulses all extremities  Neurologic Exam Mental Status: Awake and fully alert. Oriented to place and time. Recent and remote memory intact. Attention span, concentration and fund of knowledge appropriate. Mood and affect appropriate.  Diminished recall 1/3.   Able to name 10 animals which can walk on 4 legs.  Clock drawing 4/4.  On MoCA she scored 18 out of 25 as she was unable to do the visual spatial/executive function testing due to hand weakness/on instrumental activities of daily living scale she scored 6 which is mostly independent.  On Myrtis Ser index of independence in activities of daily living she scored 5 which is also mostly independent Cranial Nerves: Fundoscopic exam reveals sharp disc margins. Pupils equal, briskly reactive to light. Extraocular movements full without nystagmus. Visual fields full to confrontation. Hearing intact. Facial sensation intact. Face, tongue, palate moves normally and symmetrically.  Motor: Normal bulk and tone. Normal strength in all tested extremity muscles. Sensory.: intact to touch , pinprick , position and vibratory sensation.  Coordination: Rapid alternating movements normal in all extremities. Finger-to-nose and heel-to-shin performed accurately bilaterally. Gait and Station: Arises from chair without difficulty. Stance is normal. Gait demonstrates normal stride length and balance . Able to heel, toe and tandem walk without difficulty.  Reflexes: 1+ and symmetric. Toes downgoing.        10/03/2023   11:36 AM  Montreal Cognitive Assessment   Visuospatial/ Executive (0/5) 0  Naming (0/3) 2  Attention: Read list of digits (0/2) 2  Attention: Read list of letters (0/1) 1  Attention: Serial 7 subtraction starting at 100 (0/3) 2  Language: Repeat phrase (0/2) 2  Language : Fluency (0/1) 0  Abstraction (0/2) 2  Delayed Recall (0/5) 1  Orientation (0/6) 6  Total 18    ASSESSMENT: 78 year old Caucasian lady with 1 year history of memory loss and cognitive difficulties likely due to mild cognitive impairment.  Prior brain MRI in 2022 has shown extensive changes of cerebral microhemorrhages raising concern for amyloid angiopathy.  She has strong family history of dementia and intracerebral hemorrhage likely from  familial cerebral amyloid angiopathy.      PLAN:I long discussion with the patient and her husband and son regarding her memory loss and mild cognitive impairment  discussed the need for further evaluation by checking memory panel labs, EEG, MRI scan of the brain, MRI of the brain and neck ,apoprotein profile for risk for Alzheimer's given her family history of the same.  I encouraged her to increase participation in cognitively challenging activities like solving crossword puzzles, playing bridge and sudoku.  We also discussed memory compensation strategies.  Patient remains at high risk for cognitive deterioration in the future but unfortunately there are no medications up to date which will reduce her risk for vascular dementia from cerebral amyloid angiopathy.  Continue aspirin for stroke prevention and maintain aggressive risk factor modification with strict control of hypertension with blood pressure goal below 130/90, lipids with LDL cholesterol goal below 70 mg percent and diabetes with hemoglobin A1c goal below 6.5%.  I also encouraged her to increase her healthy diet with lots of fruits, vegetables, cereals and whole grains.  We also discussed fall prevention precautions.  She will return for follow-up in the future in 6 months or call earlier if necessary.  Greater than 50% time during this 45-minute consultation visit were spent in counseling and coordination of care about mild cognitive impairment and dementia risk and answering questions.  [name], MD Note: This document was prepared with digital dictation and possible smart phrase technology. Any transcriptional errors that result from this process are unintentional.

## 2023-10-04 ENCOUNTER — Encounter: Payer: Self-pay | Admitting: Physician Assistant

## 2023-10-06 ENCOUNTER — Telehealth: Payer: Self-pay | Admitting: Neurology

## 2023-10-06 NOTE — Telephone Encounter (Signed)
medicare/tricare NPR sent to [facility] [phone] 

## 2023-10-08 LAB — APOE ALZHEIMER'S RISK

## 2023-10-08 LAB — DEMENTIA PANEL
Homocysteine: 20.3 umol/L — ABNORMAL HIGH (ref 0.0–19.2)
RPR Ser Ql: NONREACTIVE
TSH: 1 u[IU]/mL (ref 0.450–4.500)
Vitamin B-12: 390 pg/mL (ref 232–1245)

## 2023-10-10 ENCOUNTER — Other Ambulatory Visit: Payer: Self-pay | Admitting: Cardiovascular Disease

## 2023-10-10 DIAGNOSIS — M79644 Pain in right finger(s): Secondary | ICD-10-CM | POA: Diagnosis not present

## 2023-10-10 DIAGNOSIS — M25641 Stiffness of right hand, not elsewhere classified: Secondary | ICD-10-CM | POA: Diagnosis not present

## 2023-10-10 DIAGNOSIS — Z4789 Encounter for other orthopedic aftercare: Secondary | ICD-10-CM | POA: Diagnosis not present

## 2023-10-12 ENCOUNTER — Other Ambulatory Visit: Payer: Self-pay | Admitting: Neurology

## 2023-10-12 MED ORDER — FOLIC ACID 1 MG PO TABS: 1.0000 mg | ORAL_TABLET | Freq: Every day | ORAL | 3 refills | Status: DC

## 2023-10-12 NOTE — Progress Notes (Signed)
Kindly inform the patient that lab work for reversible causes of memory loss was mostly satisfactory but homocystine levels are high which can be lowered by quitting smoking if he smokes and I am going to prescribe folic acid 1 mg daily and this level needs to be repeated in 2 to 3 months. The blood work to look for genetic risk for Alzheimer's was negative and he does not have increased genetic risk for Alzheimer's

## 2023-10-13 ENCOUNTER — Other Ambulatory Visit: Payer: Self-pay | Admitting: *Deleted

## 2023-10-13 DIAGNOSIS — R799 Abnormal finding of blood chemistry, unspecified: Secondary | ICD-10-CM

## 2023-10-13 DIAGNOSIS — E8889 Other specified metabolic disorders: Secondary | ICD-10-CM

## 2023-10-13 DIAGNOSIS — R413 Other amnesia: Secondary | ICD-10-CM

## 2023-10-13 DIAGNOSIS — Z0389 Encounter for observation for other suspected diseases and conditions ruled out: Secondary | ICD-10-CM

## 2023-10-13 DIAGNOSIS — G3184 Mild cognitive impairment, so stated: Secondary | ICD-10-CM

## 2023-10-13 DIAGNOSIS — I6781 Acute cerebrovascular insufficiency: Secondary | ICD-10-CM

## 2023-10-13 DIAGNOSIS — R7989 Other specified abnormal findings of blood chemistry: Secondary | ICD-10-CM

## 2023-10-13 DIAGNOSIS — Z87898 Personal history of other specified conditions: Secondary | ICD-10-CM

## 2023-10-18 ENCOUNTER — Encounter: Payer: Self-pay | Admitting: Neurology

## 2023-11-10 ENCOUNTER — Ambulatory Visit: Payer: Medicare Other | Admitting: Physician Assistant

## 2023-11-21 ENCOUNTER — Telehealth: Payer: Self-pay | Admitting: Physician Assistant

## 2023-11-21 ENCOUNTER — Other Ambulatory Visit: Payer: Self-pay | Admitting: Gastroenterology

## 2023-11-21 MED ORDER — PANTOPRAZOLE SODIUM 40 MG PO TBEC: 40.0000 mg | DELAYED_RELEASE_TABLET | Freq: Two times a day (BID) | ORAL | 0 refills | Status: DC

## 2023-11-21 NOTE — Telephone Encounter (Signed)
 Patient son inquiring if the protonix can be a 90 day supple instead of 30 day. Please advise.   Thank you

## 2023-11-21 NOTE — Telephone Encounter (Signed)
 Left message on voicemail that pantoprazole 40mg  one tablet twice daily was sent to [facility]. #[phone]

## 2023-11-22 ENCOUNTER — Ambulatory Visit
Admission: RE | Admit: 2023-11-22 | Discharge: 2023-11-22 | Disposition: A | Discharge status: Home / Self Care | Payer: Medicare Other | Source: Ambulatory Visit | Attending: Neurology | Admitting: Neurology

## 2023-11-22 DIAGNOSIS — I6781 Acute cerebrovascular insufficiency: Secondary | ICD-10-CM | POA: Diagnosis not present

## 2023-11-22 DIAGNOSIS — R413 Other amnesia: Secondary | ICD-10-CM | POA: Diagnosis not present

## 2023-11-22 MED ORDER — GADOPICLENOL 0.5 MMOL/ML IV SOLN
7.5000 mL | Freq: Once | INTRAVENOUS | Status: AC | PRN
  Administered 2023-11-22: 7.5 mL via INTRAVENOUS

## 2023-11-25 NOTE — Progress Notes (Signed)
 Kindly inform the patient that MRI scan of the brain showed multiple areas of tiny microhemorrhages in the brain along with changes of hardening of the arteries and shrinkage of the brain.  No acute or new abnormality.  These changes appear progressed compared with previous MRI from 2022

## 2023-11-25 NOTE — Progress Notes (Signed)
 Kindly inform the patient that MR angiogram study of the blood vessels of the neck as well as the brain showed no major blockages to worry about.

## 2023-11-28 ENCOUNTER — Telehealth: Payer: Self-pay | Admitting: *Deleted

## 2023-11-28 NOTE — Telephone Encounter (Signed)
 Spoke to patient gave mri brain and mr angiogram results Pt wanted to know how Dr Pearlean Brownie wanted to move forward with results from mri brain . Pt wanted results sent to PCP sent results to PCP and forward question to Dr Pearlean Brownie

## 2023-11-28 NOTE — Telephone Encounter (Signed)
LVM for pt to call back and discuss MRI results.

## 2023-11-29 ENCOUNTER — Ambulatory Visit (INDEPENDENT_AMBULATORY_CARE_PROVIDER_SITE_OTHER): Payer: Medicare Other | Admitting: Podiatry

## 2023-11-29 ENCOUNTER — Telehealth: Payer: Self-pay | Admitting: Podiatry

## 2023-11-29 DIAGNOSIS — Z91198 Patient's noncompliance with other medical treatment and regimen for other reason: Secondary | ICD-10-CM

## 2023-11-29 NOTE — Telephone Encounter (Signed)
-----   Message from Freddie Breech sent at 11/29/2023  4:56 PM EDT ----- Regarding: Reschedule appointment or add to next week's cancellation list [place] patient. [place] patient would like to be added to my schedule or to cancellation list for me for next week or asap. She had to cancel due to illness.  Thanks in advance.  Dr. Reece Agar.

## 2023-11-29 NOTE — Telephone Encounter (Signed)
 Spoke to patient and gave Dr Pearlean Brownie recommendations. Pamela [name] expressed understanding and thanked me for calling

## 2023-11-29 NOTE — Telephone Encounter (Signed)
 Lvm for pt to call tomorrow to see if she could take the appt for next Tuesday am( ppt avalible as of now)

## 2023-11-29 NOTE — Progress Notes (Signed)
 1. Failure to attend appointment with reason given    Patient is sick. Will reschedule.

## 2023-12-01 ENCOUNTER — Ambulatory Visit: Payer: Medicare Other | Admitting: Neurology

## 2023-12-12 ENCOUNTER — Other Ambulatory Visit

## 2023-12-12 ENCOUNTER — Encounter: Payer: Self-pay | Admitting: Physician Assistant

## 2023-12-12 ENCOUNTER — Ambulatory Visit (INDEPENDENT_AMBULATORY_CARE_PROVIDER_SITE_OTHER)
Admission: RE | Admit: 2023-12-12 | Discharge: 2023-12-12 | Disposition: A | Discharge status: Home / Self Care | Source: Ambulatory Visit | Attending: Physician Assistant | Admitting: Physician Assistant

## 2023-12-12 ENCOUNTER — Ambulatory Visit (INDEPENDENT_AMBULATORY_CARE_PROVIDER_SITE_OTHER): Payer: Medicare Other | Admitting: Physician Assistant

## 2023-12-12 VITALS — BP 124/62 | HR 86 | Ht 68.0 in | Wt 172.0 lb

## 2023-12-12 DIAGNOSIS — R1032 Left lower quadrant pain: Secondary | ICD-10-CM

## 2023-12-12 DIAGNOSIS — R109 Unspecified abdominal pain: Secondary | ICD-10-CM | POA: Diagnosis not present

## 2023-12-12 DIAGNOSIS — R194 Change in bowel habit: Secondary | ICD-10-CM

## 2023-12-12 DIAGNOSIS — R1033 Periumbilical pain: Secondary | ICD-10-CM | POA: Diagnosis not present

## 2023-12-12 DIAGNOSIS — K219 Gastro-esophageal reflux disease without esophagitis: Secondary | ICD-10-CM | POA: Diagnosis not present

## 2023-12-12 DIAGNOSIS — K529 Noninfective gastroenteritis and colitis, unspecified: Secondary | ICD-10-CM

## 2023-12-12 NOTE — Patient Instructions (Addendum)
 We have scheduled your follow up appointment for 01/23/24 at 2:30 pm. Please make sure that you arrive 15 minutes early.  Your provider has requested that you have an abdominal x ray before leaving today. Please go to the basement floor to our Radiology department for the test.  Linzess 145 mcg CAN START AFTER XRAY *IBS-C patients may begin to experience relief from belly pain and overall abdominal symptoms (pain, discomfort, and bloating) in about 1 week,  with symptoms typically improving over 12 weeks.  Take at least 30 minutes before the first meal of the day on an empty stomach You can have a loose stool if you eat a high-fat breakfast. Give it at least 7 days, may have more bowel movements during that time.   The diarrhea should go away and you should start having normal, complete, full bowel movements.  It may be helpful to start treatment when you can be near the comfort of your own bathroom, such as a weekend.  After you are out we can send in a prescription if you did well, there is a prescription card  Toileting tips to help with your constipation - Drink at least 64-80 ounces of water/liquid per day. - Establish a time to try to move your bowels every day.  For many people, this is after a cup of coffee or after a meal such as breakfast. - Sit all of the way back on the toilet keeping your back fairly straight and while sitting up, try to rest the tops of your forearms on your upper thighs.   - Raising your feet with a step stool/squatty potty can be helpful to improve the angle that allows your stool to pass through the rectum. - Relax the rectum feeling it bulge toward the toilet water.  If you feel your rectum raising toward your body, you are contracting rather than relaxing. - Breathe in and slowly exhale. "Belly breath" by expanding your belly towards your belly button. Keep belly expanded as you gently direct pressure down and back to the anus.  A low pitched GRRR sound can  assist with increasing intra-abdominal pressure.  (Can also trying to blow on a pinwheel and make it move, this helps with the same belly breathing) - Repeat 3-4 times. If unsuccessful, contract the pelvic floor to restore normal tone and get off the toilet.  Avoid excessive straining. - To reduce excessive wiping by teaching your anus to normally contract, place hands on outer aspect of knees and resist knee movement outward.  Hold 5-10 second then place hands just inside of knees and resist inward movement of knees.  Hold 5 seconds.  Repeat a few times each way.  Go to the ER if unable to pass gas, severe AB pain, unable to hold down food, any shortness of breath of chest pain.   Suggest physical therapy  Here some information about pelvic floor dysfunction. This may be contributing to some of your symptoms. We will continue with our evaluation but I do want you to consider adding on fiber supplement with low-dose MiraLAX daily. We could also refer to pelvic floor physical therapy.   Pelvic Floor Dysfunction, Female Pelvic floor dysfunction (PFD) is a condition that results when the group of muscles and connective tissues that support the organs in the pelvis (pelvic floor muscles) do not work well. These muscles and their connections form a sling that supports the colon and bladder. In women, they also support the uterus. PFD causes pelvic floor  muscles to be too weak, too tight, or both. In PFD, muscle movements are not coordinated. This may cause bowel or bladder problems. It may also cause pain. What are the causes? This condition may be caused by an injury to the pelvic area or by a weakening of pelvic muscles. This often results from pregnancy and childbirth or other types of strain. In many cases, the exact cause is not known. What increases the risk? The following factors may make you more likely to develop this condition: Having chronic bladder tissue inflammation (interstitial  cystitis). Being an older person. Being overweight. History of radiation treatment for cancer in the pelvic region. Previous pelvic surgery, such as removal of the uterus (hysterectomy). What are the signs or symptoms? Symptoms of this condition vary and may include: Bladder symptoms, such as: Trouble starting urination and emptying the bladder. Frequent urinary tract infections. Leaking urine when coughing, laughing, or exercising (stress incontinence). Having to pass urine urgently or frequently. Pain when passing urine. Bowel symptoms, such as: Constipation. Urgent or frequent bowel movements. Incomplete bowel movements. Painful bowel movements. Leaking stool or gas. Unexplained genital or rectal pain. Genital or rectal muscle spasms. Low back pain. Other symptoms may include: A heavy, full, or aching feeling in the vagina. A bulge that protrudes into the vagina. Pain during or after sex. How is this diagnosed? This condition may be diagnosed based on: Your symptoms and medical history. A physical exam. During the exam, your health care provider may check your pelvic muscles for tightness, spasm, pain, or weakness. This may include a rectal exam and a pelvic exam. In some cases, you may have diagnostic tests, such as: Electrical muscle function tests. Urine flow testing. X-ray tests of bowel function. Ultrasound of the pelvic organs. How is this treated? Treatment for this condition depends on the symptoms. Treatment options include: Physical therapy. This may include Kegel exercises to help relax or strengthen the pelvic floor muscles. Biofeedback. This type of therapy provides feedback on how tight your pelvic floor muscles are so that you can learn to control them. Internal or external massage therapy. A treatment that involves electrical stimulation of the pelvic floor muscles to help control pain (transcutaneous electrical nerve stimulation, or TENS). Sound wave  therapy (ultrasound) to reduce muscle spasms. Medicines, such as: Muscle relaxants. Bladder control medicines. Surgery to reconstruct or support pelvic floor muscles may be an option if other treatments do not help. Follow these instructions at home: Activity Do your usual activities as told by your health care provider. Ask your health care provider if you should modify any activities. Do pelvic floor strengthening or relaxing exercises at home as told by your physical therapist. Lifestyle Maintain a healthy weight. Eat foods that are high in fiber, such as beans, whole grains, and fresh fruits and vegetables. Limit foods that are high in fat and processed sugars, such as fried or sweet foods. Manage stress with relaxation techniques such as yoga or meditation. General instructions If you have problems with leakage: Use absorbable pads or wear padded underwear. Wash frequently with mild soap. Keep your genital and anal area as clean and dry as possible. Ask your health care provider if you should try a barrier cream to prevent skin irritation. Take warm baths to relieve pelvic muscle tension or spasms. Take over-the-counter and prescription medicines only as told by your health care provider. Keep all follow-up visits. How is this prevented? The cause of PFD is not always known, but there are a  few things you can do to reduce the risk of developing this condition, including: Staying at a healthy weight. Getting regular exercise. Managing stress. Contact a health care provider if: Your symptoms are not improving with home care. You have signs or symptoms of PFD that get worse at home. You develop new signs or symptoms. You have signs of a urinary tract infection, such as: Fever. Chills. Increased urinary frequency. A burning feeling when urinating. You have not had a bowel movement in 3 days (constipation). Summary Pelvic floor dysfunction results when the muscles and  connective tissues in your pelvic floor do not work well. These muscles and their connections form a sling that supports your colon and bladder. In women, they also support the uterus. PFD may be caused by an injury to the pelvic area or by a weakening of pelvic muscles. PFD causes pelvic floor muscles to be too weak, too tight, or a combination of both. Symptoms may vary from person to person. In most cases, PFD can be treated with physical therapies and medicines. Surgery may be an option if other treatments do not help. This information is not intended to replace advice given to you by your health care provider. Make sure you discuss any questions you have with your health care provider. Document Revised: 01/07/2021 Document Reviewed: 01/07/2021 Elsevier Patient Education  2022 [employer].
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
## 2023-12-13 ENCOUNTER — Other Ambulatory Visit (INDEPENDENT_AMBULATORY_CARE_PROVIDER_SITE_OTHER): Payer: Medicare Other

## 2023-12-13 DIAGNOSIS — Z0289 Encounter for other administrative examinations: Secondary | ICD-10-CM

## 2023-12-13 DIAGNOSIS — R7989 Other specified abnormal findings of blood chemistry: Secondary | ICD-10-CM | POA: Diagnosis not present

## 2023-12-13 DIAGNOSIS — E8889 Other specified metabolic disorders: Secondary | ICD-10-CM | POA: Diagnosis not present

## 2023-12-13 DIAGNOSIS — Z87898 Personal history of other specified conditions: Secondary | ICD-10-CM | POA: Diagnosis not present

## 2023-12-15 LAB — HOMOCYSTEINE: Homocysteine: 17.1 umol/L (ref 0.0–19.2)

## 2023-12-16 NOTE — Progress Notes (Signed)
 Kindly inform the patient that homocystine levels are now normal.  Continue current treatment

## 2023-12-19 ENCOUNTER — Telehealth: Payer: Self-pay

## 2023-12-19 NOTE — Telephone Encounter (Signed)
-----   Message from [name] sent at 12/16/2023  8:23 AM EDT ----- Joneen Roach inform the patient that homocystine levels are now normal.  Continue current treatment

## 2023-12-20 ENCOUNTER — Telehealth: Payer: Self-pay

## 2023-12-20 NOTE — Telephone Encounter (Signed)
-----   Message from [name] sent at 12/16/2023  8:23 AM EDT ----- Pamela [name] inform the patient that homocystine levels are now normal.  Continue current treatment

## 2023-12-20 NOTE — Telephone Encounter (Signed)
 Attempted to call Pt. No answer, LVM for call back.

## 2023-12-21 ENCOUNTER — Other Ambulatory Visit (HOSPITAL_COMMUNITY): Payer: Self-pay

## 2023-12-21 ENCOUNTER — Telehealth: Payer: Self-pay

## 2023-12-21 ENCOUNTER — Encounter: Payer: Self-pay | Admitting: Physician Assistant

## 2023-12-21 DIAGNOSIS — R413 Other amnesia: Secondary | ICD-10-CM | POA: Diagnosis not present

## 2023-12-21 DIAGNOSIS — E039 Hypothyroidism, unspecified: Secondary | ICD-10-CM | POA: Diagnosis not present

## 2023-12-21 DIAGNOSIS — R269 Unspecified abnormalities of gait and mobility: Secondary | ICD-10-CM | POA: Diagnosis not present

## 2023-12-21 DIAGNOSIS — E785 Hyperlipidemia, unspecified: Secondary | ICD-10-CM | POA: Diagnosis not present

## 2023-12-21 DIAGNOSIS — F411 Generalized anxiety disorder: Secondary | ICD-10-CM | POA: Diagnosis not present

## 2023-12-21 DIAGNOSIS — E1142 Type 2 diabetes mellitus with diabetic polyneuropathy: Secondary | ICD-10-CM | POA: Diagnosis not present

## 2023-12-21 DIAGNOSIS — I129 Hypertensive chronic kidney disease with stage 1 through stage 4 chronic kidney disease, or unspecified chronic kidney disease: Secondary | ICD-10-CM | POA: Diagnosis not present

## 2023-12-21 DIAGNOSIS — I700 Atherosclerosis of aorta: Secondary | ICD-10-CM | POA: Diagnosis not present

## 2023-12-21 DIAGNOSIS — K219 Gastro-esophageal reflux disease without esophagitis: Secondary | ICD-10-CM | POA: Diagnosis not present

## 2023-12-21 DIAGNOSIS — R194 Change in bowel habit: Secondary | ICD-10-CM

## 2023-12-21 DIAGNOSIS — K5904 Chronic idiopathic constipation: Secondary | ICD-10-CM

## 2023-12-21 DIAGNOSIS — R634 Abnormal weight loss: Secondary | ICD-10-CM

## 2023-12-21 DIAGNOSIS — H43821 Vitreomacular adhesion, right eye: Secondary | ICD-10-CM | POA: Diagnosis not present

## 2023-12-21 DIAGNOSIS — R1032 Left lower quadrant pain: Secondary | ICD-10-CM

## 2023-12-21 DIAGNOSIS — N1831 Chronic kidney disease, stage 3a: Secondary | ICD-10-CM | POA: Diagnosis not present

## 2023-12-21 DIAGNOSIS — E559 Vitamin D deficiency, unspecified: Secondary | ICD-10-CM | POA: Diagnosis not present

## 2023-12-21 MED ORDER — LINACLOTIDE 145 MCG PO CAPS: 145.0000 ug | ORAL_CAPSULE | Freq: Every day | ORAL | 2 refills | Status: DC

## 2023-12-21 MED ORDER — MIRTAZAPINE 15 MG PO TABS: 15.0000 mg | ORAL_TABLET | Freq: Every day | ORAL | 3 refills | Status: DC

## 2023-12-21 NOTE — Telephone Encounter (Signed)
 Please start PA for Linzess 145 mcg. Thanks

## 2023-12-21 NOTE — Telephone Encounter (Signed)
 What diagnosis are we using for this?

## 2023-12-21 NOTE — Addendum Note (Signed)
 Addended by: Quentin Mulling on: 12/21/2023 03:57 PM   Modules accepted: Orders

## 2023-12-21 NOTE — Telephone Encounter (Signed)
 Secure staff message sent to radiology scheduling to contact patient to set up appt.

## 2023-12-21 NOTE — Telephone Encounter (Signed)
 Called and spoke with patient's son, we reviewed recommendations as outlined below. Trey Paula knows to expect a call from radiology scheduling to set up patient's CT appt. New RX for Mirtazapine has been sent to the pharmacy and pharmacy team is working on PA for [employer]. Otherwise, patient should continue other medications as previously outlined below. Trey Paula was very thankful for my help and patience. Trey Paula had no concerns at the end of the call.

## 2023-12-21 NOTE — Telephone Encounter (Signed)
 Patient's son called into the office to give [place] an update on patient's status. Patient has a decreased appetite. Patient has been on Linzess 145 mcg since 12/17/23 and would like to continue - will send RX to pharmacy on file. Patient had 2 episodes of diarrhea since starting Linzess. Patient had an episode of large volume diarrhea today. Patient continues to have lower abdominal pain, no relief after bowel movement. Denies any nausea or vomiting. Medication adjustments since last OV are as follows: stopped Imodium, Pantoprazole 20 mg AM only, Dicyclomine PRN - only taken once since last OV, they wanted to make sure OK to take more often, Mirtazapine 7.5 mg at bedtime - they have not had a chance to talk to PCP regarding increasing dose to 15 mg, patient was scheduled today but cancelled. Patient's son wonders what the next steps are, proceed with CT scan? Or adjust medications? Please advise, thanks.

## 2023-12-21 NOTE — Telephone Encounter (Signed)
 Will get CT abdomen pelvis with contrast, may have to get kidney function. Please use chronic idiopathic constipation for Linzess We will plan on increasing mirtazapine to 15 mg at bedtime to help with mood and appetite.

## 2023-12-22 ENCOUNTER — Telehealth: Payer: Self-pay

## 2023-12-22 NOTE — Telephone Encounter (Signed)
 Attempted to call Pt at both home and mobile number. LVM on home #, unable to LVM on mobile #.

## 2023-12-22 NOTE — Telephone Encounter (Signed)
-----   Message from [name] sent at 12/16/2023  8:23 AM EDT ----- Joneen Roach inform the patient that homocystine levels are now normal.  Continue current treatment

## 2023-12-22 NOTE — Telephone Encounter (Signed)
 Contacted patient's son and patient's son agreed to pick up samples for his mother on tomorrow.

## 2023-12-22 NOTE — Telephone Encounter (Signed)
 Attempted to call Pt son (listed on DPR) in regards to test results as have been unable to reach Pt or spouse. No answer, LVM for call back.

## 2023-12-22 NOTE — Telephone Encounter (Signed)
 Patient Son called and stated his mother was prescribed Linzess but they pharmacy was not able to give it to them due to her insurance. Son spoke with his mother insurance and they advised him for him to get in contact with [place] so we are able to expedite the Prior Authorization. Patient son also stated that he was wondering if his mother could get more sample of the Linzess due to his mother running out and if so then he would be able to pick them up tomorrow. Patient son is requesting a call back.Please advise.

## 2023-12-22 NOTE — Telephone Encounter (Signed)
-----   Message from [name] sent at 12/16/2023  8:23 AM EDT ----- Pamela [name] inform the patient that homocystine levels are now normal.  Continue current treatment

## 2023-12-22 NOTE — Telephone Encounter (Signed)
 Pharmacy Patient Advocate Encounter   Received notification from Pt Calls Messages that prior authorization for Linzess capsules is required/requested.   Insurance verification completed.   The patient is insured through [employer] .   Per test claim: Prior Authorization form/request asks a question that requires your assistance. Please see the question below and advise accordingly. The PA will not be submitted until the necessary information is received.

## 2023-12-22 NOTE — Telephone Encounter (Signed)
 Pt son returned call. Informed him of test results and continue the current plan and medications. Son stated understanding. Son inquired about Candiss Norse, informed him of Dr. Marlis Edelson comment in March to consider in the future. Son stated they will continue to observe Pt with ADLs and will contact our office if they are ready to try Namenda. Son stated thankful for the call.

## 2023-12-25 NOTE — Progress Notes (Signed)
 Agree with the assessment and plan as outlined by Quentin Mulling, PA-C. ? ?Keron Neenan, DO, FACG ? ?

## 2023-12-26 NOTE — Telephone Encounter (Signed)
 Pharmacy Patient Advocate Encounter  Received notification from [employer] that Prior Authorization for Linzess 145MCG capsules has been APPROVED from 11-26-2023 to 12-25-2024   PA #/Case ID/Reference #: [id number]

## 2023-12-26 NOTE — Telephone Encounter (Signed)
 Called and spoke with patient's son Dee Farber. He is aware that Linzess PA is approved, they picked up samples last week but will contact pharmacy.

## 2023-12-26 NOTE — Telephone Encounter (Signed)
 Fiber has been tried - see recommendation under January x-ray results.  No GI obstruction

## 2023-12-27 ENCOUNTER — Ambulatory Visit (INDEPENDENT_AMBULATORY_CARE_PROVIDER_SITE_OTHER): Admitting: Podiatry

## 2023-12-27 DIAGNOSIS — Z91198 Patient's noncompliance with other medical treatment and regimen for other reason: Secondary | ICD-10-CM

## 2023-12-27 NOTE — Progress Notes (Signed)
 1. Failure to attend appointment with reason given    Appointment canceled by patient.

## 2024-01-02 DIAGNOSIS — K219 Gastro-esophageal reflux disease without esophagitis: Secondary | ICD-10-CM | POA: Diagnosis not present

## 2024-01-02 DIAGNOSIS — E1142 Type 2 diabetes mellitus with diabetic polyneuropathy: Secondary | ICD-10-CM | POA: Diagnosis not present

## 2024-01-02 DIAGNOSIS — E785 Hyperlipidemia, unspecified: Secondary | ICD-10-CM | POA: Diagnosis not present

## 2024-01-02 DIAGNOSIS — R413 Other amnesia: Secondary | ICD-10-CM | POA: Diagnosis not present

## 2024-01-02 DIAGNOSIS — H43821 Vitreomacular adhesion, right eye: Secondary | ICD-10-CM | POA: Diagnosis not present

## 2024-01-02 DIAGNOSIS — I129 Hypertensive chronic kidney disease with stage 1 through stage 4 chronic kidney disease, or unspecified chronic kidney disease: Secondary | ICD-10-CM | POA: Diagnosis not present

## 2024-01-02 DIAGNOSIS — K529 Noninfective gastroenteritis and colitis, unspecified: Secondary | ICD-10-CM | POA: Diagnosis not present

## 2024-01-02 DIAGNOSIS — F411 Generalized anxiety disorder: Secondary | ICD-10-CM | POA: Diagnosis not present

## 2024-01-02 DIAGNOSIS — E039 Hypothyroidism, unspecified: Secondary | ICD-10-CM | POA: Diagnosis not present

## 2024-01-02 DIAGNOSIS — N1831 Chronic kidney disease, stage 3a: Secondary | ICD-10-CM | POA: Diagnosis not present

## 2024-01-02 DIAGNOSIS — R634 Abnormal weight loss: Secondary | ICD-10-CM | POA: Diagnosis not present

## 2024-01-02 DIAGNOSIS — E669 Obesity, unspecified: Secondary | ICD-10-CM | POA: Diagnosis not present

## 2024-01-02 NOTE — Telephone Encounter (Signed)
 Dixon, Colette Alphonsus Jeans, [place], [place] Had to leave another message

## 2024-01-02 NOTE — Telephone Encounter (Signed)
 Radiology scheduling reached out to patient on 4/10. CT scan is not scheduled at this time. I have sent a secure staff message to radiology scheduling to see if they will reach out to patient again.

## 2024-01-03 ENCOUNTER — Ambulatory Visit: Payer: Medicare Other | Attending: Cardiovascular Disease | Admitting: Cardiovascular Disease

## 2024-01-03 ENCOUNTER — Encounter: Payer: Self-pay | Admitting: Cardiovascular Disease

## 2024-01-03 DIAGNOSIS — I952 Hypotension due to drugs: Secondary | ICD-10-CM

## 2024-01-03 DIAGNOSIS — E785 Hyperlipidemia, unspecified: Secondary | ICD-10-CM

## 2024-01-03 DIAGNOSIS — K219 Gastro-esophageal reflux disease without esophagitis: Secondary | ICD-10-CM | POA: Diagnosis not present

## 2024-01-03 DIAGNOSIS — R413 Other amnesia: Secondary | ICD-10-CM | POA: Diagnosis not present

## 2024-01-03 DIAGNOSIS — E039 Hypothyroidism, unspecified: Secondary | ICD-10-CM

## 2024-01-03 DIAGNOSIS — E119 Type 2 diabetes mellitus without complications: Secondary | ICD-10-CM

## 2024-01-03 DIAGNOSIS — I10 Essential (primary) hypertension: Secondary | ICD-10-CM

## 2024-01-03 MED ORDER — HYDROCHLOROTHIAZIDE 12.5 MG PO TABS: 12.5000 mg | ORAL_TABLET | Freq: Every day | ORAL | 3 refills | Status: DC

## 2024-01-03 NOTE — Progress Notes (Signed)
 Patient ID: Pamela [name], female   DOB: 1946-08-13, 78 y.o.   MRN: [id number]       HPI: Pamela [name] is a 78 y.o. female who presents for a 21 month followup cardiology evaluation.  Pamela [name] has a long-standing history of obesity, hypertension, type 2 diabetes mellitus, and hyperlipidemia. A Lexiscan  Myoview study in March 2014  done for evaluation of chest pain revealed normal perfusion without scar or ischemia. Post-rest ejection fraction was 74%.  She has a history of obesity and when I had seen her in 2013 she had lost approximaltey 20 pounds. She has a history of hyperlipidemia and had been started on lipid-lowering therapy when her NMR lipoprotein LDL particle number was 1834 and LDL of 110, small LDL particles of 1083 and triglycerides of 176. Initially she was taking Crestor  10 mg daily but developed myalgias and reduced the dose to every other day.  She is no longer taking statin therapy.  She is followed by Dr. Mee Spillers.   In July 2017, she underwent a five-year follow-up echo Doppler study which showed an EF of 60-65%.  There was mild LVH and grade 1 diastolic dysfunction.  She had mildly thickened mitral valve leaflets with trace to mild MR.  I saw her in January 2019 she denied any  episodes of chest pain orshortness of breath.  She cannot tolerate statins admits to having myalgias with all statins.  She was on levothyroxine  for hypothyroidism, carvedilol  6.25 mg twice a day in addition to lisinopril  40 mg daily and HCTZ 25 mg daily for hypertension.  Previously  I had recommended titration of carvedilol  to 9.375 mg twice a day, but she felt fatigued and reverted to her prior dose.  Dr. Jesse Moritz had laboratory.  Recently on 09/21/2007.  Her total cholesterol was 209, triglycerides 150, HDL 63, LDL 116.  She continues to be on JentoDueto XR and glimepiride for her diabetes mellitus.    I saw her in February 2020.  She  had purposeful weight loss with a weight reduction from 241 down to  216 pounds.  She was recently started on very low-dose Jardiance at 5 mg per Dr. Jesse Moritz.  She denied any episodes of chest tightness.  She was unaware of palpitations.  She  has been undergoing knee injections and may ultimately require knee replacement by Dr. Dayna Eve.    Last saw her on July 17, 2020 at which time she denied any chest pain.  Her weight has been fairly constant with only 2 to 3 pound weight difference over the past year.  She continues to have issues with right knee soreness and may ultimately require knee replacement.  She has undergone injections.  She walks with a cane.  At times she notes trace leg swelling vitals.  She apparently is statin intolerant.  Most recently she has been taking carvedilol  9.375 mg in the morning and 6.25 mg at night, HCTZ 25 mg in addition to lisinopril  40 mg for hypertension.  She is on levothyroxine  for hypothyroidism at 137 mcg.  With her diabetes she is on glimepiride, Jardiance, and Jentadueto XR.   Since I last saw her, she has seen multiple APP's including Sharren Decree in December 2022 and October 12, 2021.  He had been admitted in December 2022 with multiple symptoms including chest pain and was found to have acute small intraparenchymal hemorrhage of the right frontal lobe.  Troponins were negative.  Echo showed normal LV function.  Her blood pressure was  controlled on amlodipine  5 mg, carvedilol  12.5 twice a day, HCTZ 25 mg and lisinopril  40 mg.  She was on Zetia  for lipid management and has had intolerance to statins.  He is diabetic and was on glimepiride, Jardiance, and linagliptin-metformin.  He has had extensive GI evaluation with Lindle Rhea, MD and more recently [employer], PA-C.  She has iron  deficiency anemia likely from GI blood loss and has seen Dr. Liam Redhead with iron  infusion therapy.  He also is followed by Dr. Janett Medin knowing her tiny right front intracerebral hemorrhage where MRI revealed numerous microhemorrhages consistent with  CAA.  Most recently she has had issues with memory loss followed by Dr. Janett Medin.  Presently  from a cardiac standpoint, she is on a medical regimen of amlodipine  5 mg, carvedilol  12.5 mg twice a day, hydrochlorothiazide  25 mg and lisinopril  40 mg daily.  She is on Protonix  for GERD.  She takes Remeron  at bedtime.  She is on Zetia  for hyperlipidemia and takes levothyroxine  137 mcg daily with the excess exception of one half a pill on Sunday.  She is on baby aspirin.  She also takes supplemental iron  sulfate.  She continues to be on linagliptin and metformin 0.01/999 mg 2 tablets daily.  She presents for cardiology evaluation.   Past Medical History:  Diagnosis Date   Age-related nuclear cataract of right eye 04/15/2020   Amyloidosis (HCC)    organ limited   Arthritis    Cataract    Colon polyps    Diabetes mellitus (HCC)    Diverticulosis    Gallstones    GERD (gastroesophageal reflux disease)    Hearing loss    right ear   Heart murmur    Helicobacter pylori gastritis    Hiatal hernia    Hyperlipidemia    Hypertension    Hypothyroidism    Tachycardia    Tubular adenoma of colon    Vitreomacular adhesion of right eye 04/15/2020    Past Surgical History:  Procedure Laterality Date   ABDOMINAL HYSTERECTOMY     x 2   APPENDECTOMY     CHOLECYSTECTOMY     with appendectomy   COLONOSCOPY  12/16/2020   TONSILLECTOMY      Allergies  Allergen Reactions   Demerol [Meperidine] Other (See Comments)    Unspecified    Macrobid [Nitrofurantoin Macrocrystal] Other (See Comments)    Unspecified    Macrobid [Nitrofurantoin]     Other reaction(s): hearing loss in R ear   Meclizine Other (See Comments)    Unspecified    Sulfa Antibiotics Other (See Comments)    Unspecified     Current Outpatient Medications  Medication Sig Dispense Refill   amLODipine  (NORVASC ) 5 MG tablet TAKE 1 TABLET DAILY 90 tablet 0   aspirin 81 MG EC tablet 325 mg.     calcium  carbonate (TUMS EX) 750 MG  chewable tablet Chew 1 tablet by mouth as needed for heartburn.     carvedilol  (COREG ) 12.5 MG tablet Take 1 tablet (12.5 mg total) by mouth 2 (two) times daily with a meal. 180 tablet 1   cholecalciferol (VITAMIN D) 25 MCG (1000 UNIT) tablet 1,000 Units daily.     Coenzyme Q10 (CO Q 10) 100 MG CAPS Take 300 mg by mouth daily.     dicyclomine  (BENTYL ) 20 MG tablet Take 1 tablet (20 mg total) by mouth 4 (four) times daily as needed for spasms. 120 tablet 3   empagliflozin (JARDIANCE) 10 MG TABS tablet Take 5  mg by mouth daily.     ezetimibe  (ZETIA ) 10 MG tablet TAKE 1 TABLET DAILY 90 tablet 1   famotidine  (PEPCID ) 20 MG tablet Take 20 mg by mouth daily as needed for heartburn or indigestion.     ferrous sulfate  324 (65 Fe) MG TBEC Take 1 tablet (325 mg total) by mouth every other day. 30 tablet    folic acid  (FOLVITE ) 1 MG tablet Take 1 tablet (1 mg total) by mouth daily. 30 tablet 3   lansoprazole (PREVACID) 15 MG capsule Take 15 mg by mouth daily at 12 noon.     levothyroxine  (SYNTHROID , LEVOTHROID) 137 MCG tablet Take 137 mcg by mouth daily before breakfast. One daily, 1 and 1/2 on Sunday     linaclotide  (LINZESS ) 145 MCG CAPS capsule Take 1 capsule (145 mcg total) by mouth daily before breakfast. 30 capsule 2   linaGLIPtin-metFORMIN HCl ER 2.01-999 MG TB24 Take 2 tablets by mouth every morning.      lisinopril  (ZESTRIL ) 40 MG tablet TAKE 1 TABLET DAILY 30 tablet 0   loperamide  (IMODIUM ) 2 MG capsule Take 1 capsule (2 mg total) by mouth as needed for diarrhea or loose stools. 90 capsule 0   metroNIDAZOLE  (METROCREAM ) 0.75 % cream Apply 1 application  topically daily as needed (rosacea).     mirtazapine  (REMERON ) 15 MG tablet Take 1 tablet (15 mg total) by mouth at bedtime. 30 tablet 3   pantoprazole  (PROTONIX ) 40 MG tablet Take 1 tablet (40 mg total) by mouth 2 (two) times daily. 180 tablet 0   senna (SENOKOT) 8.6 MG tablet Take 1 tablet by mouth daily.     hydrochlorothiazide  (HYDRODIURIL )  12.5 MG tablet Take 1 tablet (12.5 mg total) by mouth daily. 45 tablet 3   No current facility-administered medications for this visit.    Socially she is married to she has one child. There is no tobacco or alcohol  use.  ROS General: Negative; No fevers, chills, or night sweats;  HEENT: Negative; No changes in vision or hearing, sinus congestion, difficulty swallowing Pulmonary: Negative; No cough, wheezing, shortness of breath, hemoptysis Cardiovascular: Negative; No chest pain, presyncope, syncope, palpitations Occasional leg swelling GI: Negative; No nausea, vomiting, diarrhea, or abdominal pain GU: Negative; No dysuria, hematuria, or difficulty voiding Musculoskeletal: Knee discomfort Hematologic/Oncology: Negative; no easy bruising, bleeding Endocrine: Positive for hypothyroidism and diabetes mellitus Neuro: Negative; no changes in balance, headaches Skin: Negative; No rashes or skin lesions Psychiatric: Negative; No behavioral problems, depression Sleep: Negative; No snoring, daytime sleepiness, hypersomnolence, bruxism, restless legs, hypnogognic hallucinations, no cataplexy Other comprehensive 14 point system review is negative.   PE BP 100/68   Pulse 71   Ht 5\' 8"  (1.727 m)   Wt 165 lb (74.8 kg)   SpO2 97%   BMI 25.09 kg/m     Repeat blood pressure by me 96/58  Wt Readings from Last 3 Encounters:  01/03/24 165 lb (74.8 kg)  12/12/23 172 lb (78 kg)  10/03/23 177 lb 3.2 oz (80.4 kg)   General: Alert, oriented, no distress.  Currently in wheelchair. Skin: normal turgor, no rashes, warm and dry HEENT: Normocephalic, atraumatic. Pupils equal round and reactive to light; sclera anicteric; extraocular muscles intact;  Nose without nasal septal hypertrophy Mouth/Parynx benign; Mallinpatti scale 3 Neck: No JVD, no carotid bruits; normal carotid upstroke Lungs: clear to ausculatation and percussion; no wheezing or rales Chest wall: without tenderness to  palpitation Heart: PMI not displaced, RRR, s1 s2 normal, 1/6 systolic murmur, no diastolic  murmur, no rubs, gallops, thrills, or heaves Abdomen: soft, nontender; no hepatosplenomehaly, BS+; abdominal aorta nontender and not dilated by palpation. Back: no CVA tenderness Pulses 2+ Musculoskeletal: full range of motion, normal strength, no joint deformities Extremities: no clubbing cyanosis or edema, Homan's sign negative  Neurologic: grossly nonfocal; Cranial nerves grossly wnl Psychologic: Normal mood and affect  EKG Interpretation Date/Time:  Tuesday January 03 2024 14:29:52 EDT Ventricular Rate:  71 PR Interval:  160 QRS Duration:  58 QT Interval:  362 QTC Calculation: 393 R Axis:   54  Text Interpretation: Normal sinus rhythm Low voltage QRS When compared with ECG of 17-Aug-2021 15:57, No significant change was found Confirmed by Magnus Schuller ([id number]) on 01/03/2024 4:24:20 PM    July 17, 2020 ECG (independently read by me): Sinus rhythm at 60 bpm.  No ectopy.  Normal intervals  February 2020 ECG (independently read by me): Normal sinus rhythm at 63 bpm.  No ectopy.  Normal intervals.  January 2019 ECG (independently read by me): Sinus rhythm at 66 with PAC.  ECG (independently read by me): Sinus rhythm with sinus arrhythmia and quadrigeminal PVCs.  QTc interval 410 ms.  No significant ST segment change.  June 2016 ECG (independently read by me): Normal sinus rhythm at 73 bpm.  No ectopy.  Normal intervals.  December 2015 ECG (independently read by me): Normal sinus rhythm at 65 bpm.  No ectopy.  Normal intervals.  No ST segment changes.    July 2014 ECG: Normal sinus rhythm at 63 beats per minute. Normal intervals.  LABS:     Latest Ref Rng & Units 07/10/2023    1:53 PM 03/11/2022    1:15 PM 01/07/2022   11:35 AM  BMP  Glucose 70 - 99 mg/dL 191  478  295   BUN 8 - 23 mg/dL 17  19  19    Creatinine 0.44 - 1.00 mg/dL 6.21  3.08  6.57   Sodium 135 - 145 mmol/L 139  140  138    Potassium 3.5 - 5.1 mmol/L 4.1  4.2  4.3   Chloride 98 - 111 mmol/L 107  106  104   CO2 22 - 32 mmol/L 21  27  24    Calcium  8.9 - 10.3 mg/dL 8.7  9.9  9.3       Latest Ref Rng & Units 07/10/2023    1:53 PM 03/11/2022    1:15 PM 01/07/2022   11:35 AM  Hepatic Function  Total Protein 6.5 - 8.1 g/dL 6.7  7.0  7.0   Albumin 3.5 - 5.0 g/dL 3.4  4.0  3.9   AST 15 - 41 U/L 18  14  14    ALT 0 - 44 U/L 14  10  10    Alk Phosphatase 38 - 126 U/L 71  69  83   Total Bilirubin 0.3 - 1.2 mg/dL 0.6  0.3  0.3       Latest Ref Rng & Units 07/10/2023   12:39 PM 06/10/2022    9:15 AM 03/11/2022    1:15 PM  CBC  WBC 4.0 - 10.5 K/uL 12.4  7.6  8.9   Hemoglobin 12.0 - 15.0 g/dL 84.6  96.2  95.2   Hematocrit 36.0 - 46.0 % 36.0  35.2  34.8   Platelets 150 - 400 K/uL 328  253  310    Lab Results  Component Value Date   MCV 89.8 07/10/2023   MCV 87.3 06/10/2022   MCV 85.7 03/11/2022  Lab done on 11/14/2014 at [facility] by Dr. Rosslyn Coons.  Glucose was increased at 258.  Total cholesterol 213, triglycerides 207, HDL 59, LDL 113.  LFTs normal.  BUN 19, creatinine 0.8.  Laboratory from 09/23/2017 by Dr. Jesse Moritz was reviewed.  He 116, current 0.9.  Glucose 230. Lipid studies as above in history of present illness. TSH 1.54.  Free T4 1 0.6.  Vitamin D 40.9.  Laboratory from October 12, 2018 was reviewed.  Lipid studies remain elevated with total cholesterol 229, HDL 70, LDL 123, triglycerides 179.  Hemoglobin A1c 7.1.  Creatinine 1.0.  Laboratory from November 06, 2019:  Total cholesterol 190, HDL 64, LDL 108, triglycerides 90. Hemoglobin A1c 6.7 BUN 19 creatinine 0.9 TSH 0.51  RADIOLOGY: No results found.  IMPRESSION:  1. Primary hypertension   2. Hypotension due to drugs   3. Gastroesophageal reflux disease without esophagitis   4. Hyperlipidemia with target LDL less than 70   5. Diabetes mellitus without complication (HCC)   6. Hypothyroidism, unspecified type   7.  Memory impairment     ASSESSMENT AND PLAN:  Ms. Ernesteen Mihalic is a 78 year old female who has a history of moderate obesity, hypertension, type 2 diabetes mellitus, and hypothyroidism.  I have not seen her in over 4 years and she continues to be on a multiple drug regimen which now consists of zotepine 5 mg, carvedilol  12.5 mg twice a day, HCTZ 25 mg, in addition to lisinopril  40 mg daily.  Her blood pressure today is low and on repeat by me was 96/58.  She does not have any volume overload and is euvolemic on exam.  I have recommended she decrease HCTZ from 25 mg daily down to 12.5 mg.  She will need to be monitored closely for follow-up blood pressure and perhaps additional medication adjustment may be necessary.  Notably, she had undergone catheterization in September 2010 which showed normal coronary arteries.  She had a negative Myoview study in 2014.  She had developed a small intraparenchymal hemorrhage and was not felt to have a stroke and is followed by Dr. Janett Medin.  She has had recent progressive memory issues for which he is investigating.  He also has had GI issues and has had iron  deficiency requiring IV iron  therapy.  Her last echo Doppler study in December 2022 showed EF 60 to 65% with normal strain pattern and normal diastolic parameters.  She is statin intolerant and has been on Zetia  for lipid-lowering therapy.  She has GI follow-up scheduled with Santina Cull in May 2025 and will be seeing Dr. Janett Medin in September 2025.  I discussed with her my plans for upcoming retirement in several months.  Continues to be followed by Dr. Jesse Moritz for her hypothyroidism and her diabetes mellitus.  I will transition her to the care of Dr. Janeece Mechanic to be seen in our [place] office in approximately 8 months or sooner as needed.  Pamela Ally, MD, [facility]  01/03/2024 4:42 PM

## 2024-01-03 NOTE — Patient Instructions (Addendum)
 Medication Instructions:  START HYDROCHLOROTHIAZIDE  12.5 MG DAILY *If you need a refill on your cardiac medications before your next appointment, please call your pharmacy*  Lab Work: NO LABS If you have labs (blood work) drawn today and your tests are completely normal, you will receive your results only by: MyChart Message (if you have MyChart) OR A paper copy in the mail If you have any lab test that is abnormal or we need to change your treatment, we will call you to review the results.  Testing/Procedures: NO TESTING  Follow-Up: At [facility], you and your health needs are our priority.  As part of our continuing mission to provide you with exceptional heart care, our providers are all part of one team.  This team includes your primary Cardiologist (physician) and Advanced Practice Providers or APPs (Physician Assistants and Nurse Practitioners) who all work together to provide you with the care you need, when you need it.  Your next appointment:   6-8 month(s)  Provider:   Sheryle Donning, MD   We recommend signing up for the patient portal called "MyChart".  Sign up information is provided on this After Visit Summary.  MyChart is used to connect with patients for Virtual Visits (Telemedicine).  Patients are able to view lab/test results, encounter notes, upcoming appointments, etc.  Non-urgent messages can be sent to your provider as well.   To learn more about what you can do with MyChart, go to [email or web address].   Other Instructions   1st Floor: - Lobby - Registration  - Pharmacy  - Lab - Cafe  2nd Floor: - PV Lab - Diagnostic Testing (echo, CT, nuclear med)  3rd Floor: - Vacant  4th Floor: - TCTS (cardiothoracic surgery) - AFib Clinic - Structural Heart Clinic - Vascular Surgery  - Vascular Ultrasound  5th Floor: - [facility] (general and EP) - Clinical Pharmacy for coumadin, hypertension, lipid, weight-loss medications,  and med management appointments    Valet parking services will be available as well.

## 2024-01-09 ENCOUNTER — Other Ambulatory Visit: Payer: Self-pay | Admitting: Cardiovascular Disease

## 2024-01-09 NOTE — Telephone Encounter (Signed)
 CT scheduled for 01/11/24 at 1 pm.

## 2024-01-11 ENCOUNTER — Ambulatory Visit (HOSPITAL_BASED_OUTPATIENT_CLINIC_OR_DEPARTMENT_OTHER)
Admission: RE | Admit: 2024-01-11 | Discharge: 2024-01-11 | Disposition: A | Discharge status: Home / Self Care | Source: Ambulatory Visit | Attending: Physician Assistant | Admitting: Physician Assistant

## 2024-01-11 DIAGNOSIS — R1032 Left lower quadrant pain: Secondary | ICD-10-CM | POA: Diagnosis not present

## 2024-01-11 MED ORDER — IOHEXOL 300 MG/ML  SOLN
100.0000 mL | Freq: Once | INTRAMUSCULAR | Status: AC | PRN
  Administered 2024-01-11: 100 mL via INTRAVENOUS

## 2024-01-20 ENCOUNTER — Telehealth: Payer: Self-pay

## 2024-01-20 NOTE — Telephone Encounter (Signed)
 Left message for patient to call back. Needs OV to be rescheduled.

## 2024-01-20 NOTE — Telephone Encounter (Signed)
 Rescheduled patient with son tentatively for 01/30/24 at 1:30 pm with Mylinda Asa, PA (7 day hold). Son is aware he will get a call on Monday to confirm appointment.

## 2024-01-23 ENCOUNTER — Other Ambulatory Visit: Payer: Self-pay

## 2024-01-23 ENCOUNTER — Ambulatory Visit: Admitting: Physician Assistant

## 2024-01-23 ENCOUNTER — Telehealth: Payer: Self-pay

## 2024-01-23 DIAGNOSIS — K5904 Chronic idiopathic constipation: Secondary | ICD-10-CM

## 2024-01-23 MED ORDER — LINACLOTIDE 145 MCG PO CAPS: 145.0000 ug | ORAL_CAPSULE | Freq: Every day | ORAL | 1 refills | Status: DC

## 2024-01-23 NOTE — Telephone Encounter (Signed)
 Appt has been scheduled for 01/30/24 at 1:30 with Mylinda Asa. Called and left detailed vm with appt information to confirm. Advised if they needed to change appt to give us  a call back.

## 2024-01-23 NOTE — Telephone Encounter (Signed)
-----   Message from Nurse Archer Kobs C sent at 01/20/2024  1:21 PM EDT ----- Regarding: 7 DAY HOLD Book 7 day hold slot 5/19 at 1:30 with Mylinda Asa & confirm with son.

## 2024-01-27 DIAGNOSIS — E039 Hypothyroidism, unspecified: Secondary | ICD-10-CM | POA: Diagnosis not present

## 2024-01-27 DIAGNOSIS — N1831 Chronic kidney disease, stage 3a: Secondary | ICD-10-CM | POA: Diagnosis not present

## 2024-01-27 DIAGNOSIS — E785 Hyperlipidemia, unspecified: Secondary | ICD-10-CM | POA: Diagnosis not present

## 2024-01-27 DIAGNOSIS — I1310 Hypertensive heart and chronic kidney disease without heart failure, with stage 1 through stage 4 chronic kidney disease, or unspecified chronic kidney disease: Secondary | ICD-10-CM | POA: Diagnosis not present

## 2024-01-27 DIAGNOSIS — K219 Gastro-esophageal reflux disease without esophagitis: Secondary | ICD-10-CM | POA: Diagnosis not present

## 2024-01-30 ENCOUNTER — Ambulatory Visit: Admitting: Physician Assistant

## 2024-01-30 VITALS — BP 120/80 | HR 63 | Wt 163.0 lb

## 2024-01-30 DIAGNOSIS — K529 Noninfective gastroenteritis and colitis, unspecified: Secondary | ICD-10-CM | POA: Diagnosis not present

## 2024-01-30 DIAGNOSIS — R194 Change in bowel habit: Secondary | ICD-10-CM

## 2024-01-30 DIAGNOSIS — R1032 Left lower quadrant pain: Secondary | ICD-10-CM | POA: Diagnosis not present

## 2024-01-30 DIAGNOSIS — K219 Gastro-esophageal reflux disease without esophagitis: Secondary | ICD-10-CM

## 2024-01-30 DIAGNOSIS — R159 Full incontinence of feces: Secondary | ICD-10-CM

## 2024-01-30 MED ORDER — COLESTIPOL HCL 1 G PO TABS: 2.0000 g | ORAL_TABLET | Freq: Two times a day (BID) | ORAL | 0 refills | Status: DC

## 2024-01-30 NOTE — Patient Instructions (Addendum)
 Continue the remeron  15 mg at night Continue the dicyclomine  every morning and then as needed during the day up to 3 x a day  Stop the linzess  Stop imodium , can be as needed Do trial of colestipol  1 gram, start one in the morning before breakfast, can increase to 1 in the morning and one at night, max is 2 in the morning and 2 at night  FIBER SUPPLEMENT You can do metamucil or fibercon once or twice a day but if this causes gas/bloating please switch to Benefiber or Citracel.  Fiber is good for constipation/diarrhea/irritable bowel syndrome.  It can also help with weight loss and can help lower your bad cholesterol (LDL).  Please do 1 TBSP in the morning in water, coffee, or tea.  It can take up to a month before you can see a difference with your bowel movements.  It is cheapest from [employer], [employer], [employer].     FODMAP stands for fermentable oligo-, di-, mono-saccharides and polyols (1). These are the scientific terms used to classify groups of carbs that are difficult for our body to digest and that are notorious for triggering digestive symptoms like bloating, gas, loose stools and stomach pain.   You can try low FODMAP diet  - start with eliminating just one column at a time that you feel may be a trigger for you. - the table at the very bottom contains foods that are low in FODMAPs   Sometimes trying to eliminate the FODMAP's from your diet is difficult or tricky, if you are stuggling with trying to do the elimination diet you can try an enzyme.  There is a food enzymes that you sprinkle in or on your food that helps break down the FODMAP. You can read more about the enzyme by going to this site: https://fodzyme.com/
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
## 2024-02-02 NOTE — Progress Notes (Signed)
 Agree with the assessment and plan as outlined by Quentin Mulling, PA-C. ? ?Keron Neenan, DO, FACG ? ?

## 2024-02-03 ENCOUNTER — Other Ambulatory Visit: Payer: Self-pay

## 2024-02-03 MED ORDER — LINACLOTIDE 145 MCG PO CAPS: 145.0000 ug | ORAL_CAPSULE | Freq: Every day | ORAL | 3 refills | Status: DC

## 2024-02-13 ENCOUNTER — Telehealth: Payer: Self-pay | Admitting: Neurology

## 2024-02-13 ENCOUNTER — Other Ambulatory Visit: Payer: Self-pay | Admitting: Neurology

## 2024-02-13 MED ORDER — FOLIC ACID 1 MG PO TABS: 1.0000 mg | ORAL_TABLET | Freq: Every day | ORAL | 1 refills | Status: DC

## 2024-02-13 NOTE — Telephone Encounter (Signed)
 Pt's son Dee Farber called wanting to speak to the nurse regarding the pt's folic acid  (FOLVITE ) 1 MG tablet. He stated that he misunderstood if this medication was to be stopped or increased in dosage. He states that the pt is completely out and is needing to be advised.

## 2024-02-13 NOTE — Telephone Encounter (Signed)
 Called the son back. Advised Dr Janett Medin had recommended to continue the current medication at same 1 mg dose daily. Pt will have a follow up visit with Dr Janett Medin in September at which point can determine if pcp should take over of if the patient should continue to take folic acid .   Son asked a refill be sent into the pharmacy for her until her follow up in sept. He was appreciative for the call back.

## 2024-02-13 NOTE — Telephone Encounter (Signed)
 Called the pt's son back. There was no asnwer LVM advising the son that per the last time when this was checked and Dr Janett Medin had ordered folic acid  1 mg daily.   "Kindly inform the patient that lab work for reversible causes of memory loss was mostly satisfactory but homocystine levels are high which can be lowered by quitting smoking if he smokes and I am going to prescribe folic acid  1 mg daily and this level needs to be repeated in 2 to 3 months. "   If the son calls back, please advise that on 4/1 the lab work was rechecked and was normal range. Dr Janett Medin had said continue current treatment plan. He didn't say to stop the folic acid . Pt can discuss further with PCP about whether she should stop taking the folic acid  after a certain point?

## 2024-02-13 NOTE — Telephone Encounter (Signed)
 Pt's son returned call and is needing to speak to the RN regarding the dosage of the medication. He states she is needing a refill as soon as possible and would like a call back to discuss.

## 2024-02-28 ENCOUNTER — Encounter: Payer: Self-pay | Admitting: Podiatry

## 2024-02-28 ENCOUNTER — Ambulatory Visit (INDEPENDENT_AMBULATORY_CARE_PROVIDER_SITE_OTHER): Payer: Medicare Other | Admitting: Podiatry

## 2024-02-28 VITALS — Ht 68.0 in | Wt 163.0 lb

## 2024-02-28 DIAGNOSIS — E1142 Type 2 diabetes mellitus with diabetic polyneuropathy: Secondary | ICD-10-CM | POA: Diagnosis not present

## 2024-02-28 DIAGNOSIS — M79675 Pain in left toe(s): Secondary | ICD-10-CM | POA: Diagnosis not present

## 2024-02-28 DIAGNOSIS — B351 Tinea unguium: Secondary | ICD-10-CM

## 2024-02-28 DIAGNOSIS — M79674 Pain in right toe(s): Secondary | ICD-10-CM

## 2024-03-04 NOTE — Progress Notes (Signed)
  Subjective:  Patient ID: Pamela [name], female    DOB: 09/27/1945,  MRN: [id number]  Pamela [name] presents to clinic today for at risk foot care with history of diabetic neuropathy and painful mycotic toenails x 10 which interfere with daily activities. Pain is relieved with periodic professional debridement.  Chief Complaint  Patient presents with   Nail Problem    Pt is here for [facility] last A1C was 6 PCP is Dr Nichole and LOV was in April.   New problem(s): None.   PCP is Nichole Senior, MD.  Allergies  Allergen Reactions   Demerol [Meperidine] Other (See Comments)    Unspecified    Macrobid [Nitrofurantoin Macrocrystal] Other (See Comments)    Unspecified    Macrobid [Nitrofurantoin]     Other reaction(s): hearing loss in R ear   Meclizine Other (See Comments)    Unspecified    Sulfa Antibiotics Other (See Comments)    Unspecified     Review of Systems: Negative except as noted in the HPI.  Objective: No changes noted in today's physical examination. There were no vitals filed for this visit. Pamela [name] is a pleasant 78 y.o. female in NAD. AAO x 3.  Vascular Examination: Capillary refill time immediate b/l. Vascular status intact b/l with palpable pedal pulses. Pedal hair present b/l. No edema. No pain with calf compression b/l. Skin temperature gradient WNL b/l. No cyanosis or clubbing noted b/l LE.   Raised venous nodule, freely movable noted dorsal aspect right great toe along EHL tendon. Has blue hue, is nonpulsatile and asymptomatic.  Neurological Examination: Sensation grossly intact b/l with 10 gram monofilament. Vibratory sensation intact b/l.   Dermatological Examination: Pedal skin with normal turgor, texture and tone b/l.  No open wounds. No interdigital macerations.   Toenails 1-5 b/l thick, discolored, elongated with subungual debris and pain on dorsal palpation.   No hyperkeratotic nor porokeratotic lesions present on today's  visit.  Musculoskeletal Examination: Muscle strength 5/5 to all lower extremity muscle groups bilaterally. No pain, crepitus or joint limitation noted with ROM bilateral LE. HAV with bunion deformity noted b/l LE. Pes planus deformity noted bilateral LE. Utilizes cane for ambulation assistance.  Radiographs: None  Assessment/Plan: 1. Pain due to onychomycosis of toenails of both feet   2. Polyneuropathy due to type 2 diabetes mellitus (HCC)   Diabetic foot examination performed today.  All patient's and/or POA's questions/concerns addressed on today's visit. Mycotic toenails 1-5 debrided in length and girth without incident. Continue daily foot inspections and monitor blood glucose per PCP/Endocrinologist's recommendations. Continue soft, supportive shoe gear daily. Report any pedal injuries to medical professional. Call office if there are any questions/concerns. -Patient/POA to call should there be question/concern in the interim.   Return in about 3 months (around 05/30/2024).  Delon LITTIE Merlin, DPM      [facility] LOCATION: [address] [place] [address]                   Office [phone]   [facility] LOCATION: [address] [place], [place] [address] Office [phone]

## 2024-03-23 ENCOUNTER — Other Ambulatory Visit: Payer: Self-pay | Admitting: Physician Assistant

## 2024-03-28 ENCOUNTER — Telehealth: Payer: Self-pay | Admitting: Neurology

## 2024-03-28 NOTE — Telephone Encounter (Signed)
 Pts son called wanting to speak to the RN regarding when the pt should start taking Namenda. Please advise.

## 2024-03-29 NOTE — Telephone Encounter (Signed)
 Attempted to call Pt son. No answer, LVM for call back.

## 2024-03-29 NOTE — Telephone Encounter (Addendum)
 Son of pt has returned call to [place], [employer]ity fundraiser. Please give him a call back at [phone]

## 2024-03-29 NOTE — Telephone Encounter (Signed)
 Spoke w/Pt son regarding Namenda. Son stated Pt is not really worse but son stated they are ready to try it. They are on board with starting now or waiting to discuss at her appt 06/12/24. Son also asked if she starts Namenda should they stop the Prevagen. Informed son will reach out to provider and will make them aware of he response once received. Son voiced understanding and thanks for the call.

## 2024-04-02 MED ORDER — MEMANTINE HCL 28 X 5 MG & 21 X 10 MG PO TABS: ORAL_TABLET | ORAL | 0 refills | Status: DC

## 2024-04-02 MED ORDER — MEMANTINE HCL 10 MG PO TABS: 10.0000 mg | ORAL_TABLET | Freq: Two times a day (BID) | ORAL | 2 refills | Status: DC

## 2024-04-02 NOTE — Addendum Note (Signed)
 Addended by: Ambra Haverstick K on: 04/02/2024 09:08 AM   Modules accepted: Orders

## 2024-04-02 NOTE — Telephone Encounter (Signed)
 Pt son cld back. Discussed provider recommendation, ok to continue Prevagen if taking memantine . Made son aware memantine  titration pack was sent to [facility] in chart, as well as a script for the dose she will be on once titration pack is finished. Son voiced understanding and thankful for the information.

## 2024-04-02 NOTE — Telephone Encounter (Signed)
 Attempted to call son regarding Prevagen. No answer, LVM for call back.

## 2024-04-04 MED ORDER — MEMANTINE HCL 5 MG PO TABS: ORAL_TABLET | ORAL | 0 refills | Status: AC

## 2024-04-04 NOTE — Telephone Encounter (Signed)
 Son called back with questions on the medication that was sent in for the pt. Please advise.

## 2024-04-04 NOTE — Telephone Encounter (Signed)
 Spoke w/provider. New script for memantine  5 mg sent to Pt pharmacy.

## 2024-04-04 NOTE — Telephone Encounter (Signed)
 Spoke w/Pt son who reported [facility], [facility], and [facility] do not have the memantine  titration pack. Son was told by [facility] that the pack has been D/C'd [facility]. Informed son will speak with provider and send script for titration doses per provider orders. Son voiced understanding and thanks for the call back.

## 2024-04-04 NOTE — Addendum Note (Signed)
 Addended by: Sahaj Bona K on: 04/04/2024 04:58 PM   Modules accepted: Orders
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
## 2024-04-11 ENCOUNTER — Ambulatory Visit (INDEPENDENT_AMBULATORY_CARE_PROVIDER_SITE_OTHER): Admitting: Physician Assistant

## 2024-04-11 ENCOUNTER — Encounter: Payer: Self-pay | Admitting: Physician Assistant

## 2024-04-11 VITALS — BP 120/60 | HR 70 | Ht 68.0 in | Wt 165.0 lb

## 2024-04-11 DIAGNOSIS — K219 Gastro-esophageal reflux disease without esophagitis: Secondary | ICD-10-CM

## 2024-04-11 DIAGNOSIS — K529 Noninfective gastroenteritis and colitis, unspecified: Secondary | ICD-10-CM | POA: Diagnosis not present

## 2024-04-11 DIAGNOSIS — D509 Iron deficiency anemia, unspecified: Secondary | ICD-10-CM

## 2024-04-11 DIAGNOSIS — E119 Type 2 diabetes mellitus without complications: Secondary | ICD-10-CM

## 2024-04-11 DIAGNOSIS — R109 Unspecified abdominal pain: Secondary | ICD-10-CM

## 2024-04-11 DIAGNOSIS — R194 Change in bowel habit: Secondary | ICD-10-CM

## 2024-04-11 DIAGNOSIS — R1032 Left lower quadrant pain: Secondary | ICD-10-CM

## 2024-04-11 MED ORDER — COLESTIPOL HCL 1 G PO TABS: 2.0000 g | ORAL_TABLET | Freq: Two times a day (BID) | ORAL | 3 refills | Status: DC

## 2024-04-11 MED ORDER — MIRTAZAPINE 15 MG PO TABS: 15.0000 mg | ORAL_TABLET | Freq: Every day | ORAL | 3 refills | Status: AC

## 2024-04-11 NOTE — Patient Instructions (Addendum)
 FIBER SUPPLEMENT Add Benefiber or Citracel.  Fiber is good for constipation/diarrhea/irritable bowel syndrome.  It can also help with weight loss and can help lower your bad cholesterol (LDL).  Please do 1 TBSP in the morning in water, coffee, or tea.  It can take up to a month before you can see a difference with your bowel movements.  It is cheapest from [employer], [employer], [employer].   VISIT SUMMARY:  Today, we discussed your ongoing issues with diarrhea following your gallbladder removal, as well as your management of GERD, diverticulosis, and anxiety symptoms. We reviewed your current medications and made some adjustments to help improve your symptoms.  YOUR PLAN:  -CHRONIC DIARRHEA POST-CHOLECYSTECTOMY AND IRRITABLE BOWEL SYNDROME WITH DIARRHEA: This condition involves frequent, loose bowel movements likely due to bile acid malabsorption after gallbladder removal. You should continue taking colestipol , which has helped improve your bowel habits. You can increase the dose up to four pills per day if needed. Additionally, adding Benefiber to your diet may help form stools. Your colestipol  prescription has been refilled for 90 days at [facility].  -GASTROESOPHAGEAL REFLUX DISEASE (GERD): GERD is a condition where stomach acid frequently flows back into the tube connecting your mouth and stomach, causing heartburn and indigestion. Your symptoms are well-controlled with pantoprazole , so you should continue taking it once daily.  -DIVERTICULOSIS OF COLON: Diverticulosis is a condition where small bulging pouches develop in the digestive tract. Your recent CT scan showed no obstruction or inflammation, so no changes are needed at this time.  -GENERALIZED ANXIETY SYMPTOMS: Your anxiety symptoms have improved with mirtazapine . You should continue taking mirtazapine  15 mg at night. Loperamide  is available for use as needed if you experience anxiety-related diarrhea.  INSTRUCTIONS:  Please continue with  your current medications as discussed. If your symptoms change or worsen, schedule a follow-up appointment. Your colestipol  prescription has been refilled for 90 days at [facility]

## 2024-04-17 ENCOUNTER — Other Ambulatory Visit: Payer: Self-pay

## 2024-04-17 MED ORDER — COLESTIPOL HCL 1 G PO TABS: 2.0000 g | ORAL_TABLET | Freq: Two times a day (BID) | ORAL | 3 refills | Status: DC

## 2024-04-19 ENCOUNTER — Telehealth: Payer: Self-pay | Admitting: *Deleted

## 2024-04-19 MED ORDER — COLESTIPOL HCL 1 G PO TABS: 2.0000 g | ORAL_TABLET | Freq: Two times a day (BID) | ORAL | 3 refills | Status: AC

## 2024-04-19 NOTE — Telephone Encounter (Signed)
 90 day fax request from [facility] . Sent in Electronically

## 2024-04-30 ENCOUNTER — Other Ambulatory Visit: Payer: Self-pay | Admitting: Cardiology

## 2024-05-01 ENCOUNTER — Other Ambulatory Visit: Payer: Self-pay | Admitting: Student

## 2024-05-01 MED ORDER — EZETIMIBE 10 MG PO TABS: 10.0000 mg | ORAL_TABLET | Freq: Every day | ORAL | 2 refills | Status: DC

## 2024-05-04 ENCOUNTER — Telehealth: Payer: Self-pay | Admitting: Cardiovascular Disease

## 2024-05-04 MED ORDER — CARVEDILOL 12.5 MG PO TABS: 12.5000 mg | ORAL_TABLET | Freq: Two times a day (BID) | ORAL | 2 refills | Status: AC

## 2024-05-04 MED ORDER — CARVEDILOL 12.5 MG PO TABS: 12.5000 mg | ORAL_TABLET | Freq: Two times a day (BID) | ORAL | 0 refills | Status: DC

## 2024-05-04 NOTE — Telephone Encounter (Signed)
 Pt's medication was sent to pt's pharmacy as requested. Confirmation received.

## 2024-05-04 NOTE — Telephone Encounter (Signed)
*  STAT* If patient is at the pharmacy, call can be transferred to refill team.   1. Which medications need to be refilled? (please list name of each medication and dose if known) carvedilol  (COREG ) 12.5 MG tablet   2. Which pharmacy/location (including street and city if local pharmacy) is medication to be sent to? [id number] - , [place] - [address] AT [facility] OF [address]   3. Do they need a 30 day or 90 day supply? 30    *STAT* If patient is at the pharmacy, call can be transferred to refill team.   1. Which medications need to be refilled? (please list name of each medication and dose if known) carvedilol  (COREG ) 12.5 MG tablet   2. Which pharmacy/location (including street and city if local pharmacy) is medication to be sent to? [facility] - [place], [place] - [address]   3. Do they need a 30 day or 90 day supply? 90

## 2024-05-09 DIAGNOSIS — I129 Hypertensive chronic kidney disease with stage 1 through stage 4 chronic kidney disease, or unspecified chronic kidney disease: Secondary | ICD-10-CM | POA: Diagnosis not present

## 2024-05-09 DIAGNOSIS — E1142 Type 2 diabetes mellitus with diabetic polyneuropathy: Secondary | ICD-10-CM | POA: Diagnosis not present

## 2024-05-09 DIAGNOSIS — K219 Gastro-esophageal reflux disease without esophagitis: Secondary | ICD-10-CM | POA: Diagnosis not present

## 2024-05-09 DIAGNOSIS — E039 Hypothyroidism, unspecified: Secondary | ICD-10-CM | POA: Diagnosis not present

## 2024-05-09 DIAGNOSIS — R413 Other amnesia: Secondary | ICD-10-CM | POA: Diagnosis not present

## 2024-05-09 DIAGNOSIS — R269 Unspecified abnormalities of gait and mobility: Secondary | ICD-10-CM | POA: Diagnosis not present

## 2024-05-09 DIAGNOSIS — E669 Obesity, unspecified: Secondary | ICD-10-CM | POA: Diagnosis not present

## 2024-05-09 DIAGNOSIS — N1831 Chronic kidney disease, stage 3a: Secondary | ICD-10-CM | POA: Diagnosis not present

## 2024-05-09 DIAGNOSIS — E559 Vitamin D deficiency, unspecified: Secondary | ICD-10-CM | POA: Diagnosis not present

## 2024-05-09 DIAGNOSIS — E785 Hyperlipidemia, unspecified: Secondary | ICD-10-CM | POA: Diagnosis not present

## 2024-05-09 DIAGNOSIS — I5189 Other ill-defined heart diseases: Secondary | ICD-10-CM | POA: Diagnosis not present

## 2024-05-09 DIAGNOSIS — F411 Generalized anxiety disorder: Secondary | ICD-10-CM | POA: Diagnosis not present

## 2024-05-10 ENCOUNTER — Other Ambulatory Visit: Payer: Self-pay | Admitting: General Surgery

## 2024-05-10 DIAGNOSIS — D1724 Benign lipomatous neoplasm of skin and subcutaneous tissue of left leg: Secondary | ICD-10-CM | POA: Diagnosis not present

## 2024-05-10 DIAGNOSIS — L821 Other seborrheic keratosis: Secondary | ICD-10-CM | POA: Diagnosis not present

## 2024-05-15 ENCOUNTER — Ambulatory Visit
Admission: RE | Admit: 2024-05-15 | Discharge: 2024-05-15 | Disposition: A | Discharge status: Home / Self Care | Source: Ambulatory Visit | Attending: General Surgery | Admitting: General Surgery

## 2024-05-15 DIAGNOSIS — R2242 Localized swelling, mass and lump, left lower limb: Secondary | ICD-10-CM | POA: Diagnosis not present

## 2024-05-15 DIAGNOSIS — D1724 Benign lipomatous neoplasm of skin and subcutaneous tissue of left leg: Secondary | ICD-10-CM

## 2024-05-23 DIAGNOSIS — R2242 Localized swelling, mass and lump, left lower limb: Secondary | ICD-10-CM | POA: Diagnosis not present

## 2024-05-24 ENCOUNTER — Telehealth: Payer: Self-pay | Admitting: Physician Assistant

## 2024-05-24 NOTE — Telephone Encounter (Signed)
 Received a call from patients son, Prescription of REMERON  15mg  needs refill before Tuesday of next week. Patients son exclaims her past symptoms have returned and is requesting fu call to discuss solutions or scheduling. Please review and advise  Thank you

## 2024-05-25 NOTE — Telephone Encounter (Signed)
 Left message for patients son to call back.

## 2024-05-28 NOTE — Telephone Encounter (Signed)
 Left message for patients son to call back.

## 2024-05-29 NOTE — Telephone Encounter (Signed)
 Patient son returning call. Please advise.

## 2024-05-29 NOTE — Telephone Encounter (Signed)
 Please advise on refill for Protonix .

## 2024-05-29 NOTE — Telephone Encounter (Signed)
 Pt son Juliane stated that the pt was doing really well without having episodes of incontinence for about a month but recently the episodes have started back again and increasing.  Chart reviewed and noted that she does have a history of this. Last office visit was 04/11/2024. Pt was scheduled for an office visit on 06/26/2024 at 10:40 with Alan Coombs PA. Juliane made aware  Pt son Juliane stated that the pt is almost of of Protonix  and will need a refill.  Please advise on refill.

## 2024-05-29 NOTE — Telephone Encounter (Signed)
Left message for pt/son to call back.

## 2024-05-30 ENCOUNTER — Ambulatory Visit (INDEPENDENT_AMBULATORY_CARE_PROVIDER_SITE_OTHER): Admitting: Podiatry

## 2024-05-30 ENCOUNTER — Encounter: Payer: Self-pay | Admitting: Podiatry

## 2024-05-30 DIAGNOSIS — E1142 Type 2 diabetes mellitus with diabetic polyneuropathy: Secondary | ICD-10-CM | POA: Diagnosis not present

## 2024-05-30 DIAGNOSIS — B351 Tinea unguium: Secondary | ICD-10-CM | POA: Diagnosis not present

## 2024-05-30 DIAGNOSIS — M79674 Pain in right toe(s): Secondary | ICD-10-CM

## 2024-05-30 DIAGNOSIS — M79675 Pain in left toe(s): Secondary | ICD-10-CM

## 2024-05-30 NOTE — Telephone Encounter (Signed)
 Patient son calling back . Please advise.

## 2024-05-30 NOTE — Telephone Encounter (Signed)
Left message for pt/son to call back.

## 2024-05-31 MED ORDER — PANTOPRAZOLE SODIUM 40 MG PO TBEC: 40.0000 mg | DELAYED_RELEASE_TABLET | Freq: Two times a day (BID) | ORAL | 0 refills | Status: DC

## 2024-05-31 NOTE — Telephone Encounter (Signed)
 PT son is calling back in reference to having pantoprazole  refilled. The provider approved it but was never sent out. Please send script to [facility] on [facility] and [place].

## 2024-05-31 NOTE — Telephone Encounter (Signed)
 Discussed PA recommendations with son & he is aware refill for protonix  has already ben sent in. He will slowly increase colestipol  tablets & add on imodium  prn. He's been advised to be cautious with adding on imodium  d/t risk for constipation. Advised they call back with any further questions/concerns.

## 2024-05-31 NOTE — Telephone Encounter (Signed)
 Done under amanda

## 2024-06-03 NOTE — Progress Notes (Signed)
  Subjective:  Patient ID: Pamela [name], female    DOB: 08/25/1946,  MRN: [id number]  Pamela [name] presents to clinic today for at risk foot care with history of diabetic neuropathy and painful thick toenails that are difficult to trim. Pain interferes with ambulation. Aggravating factors include wearing enclosed shoe gear. Pain is relieved with periodic professional debridement.  Chief Complaint  Patient presents with   Diabetes    [facility] Diet control diabetes. A1C 6.7.LOV with PCP 01/27/24. Toenail trim.   New problem(s): None.   PCP is Nichole Senior, MD.  Allergies  Allergen Reactions   Demerol [Meperidine] Other (See Comments)    Unspecified    Macrobid [Nitrofurantoin Macrocrystal] Other (See Comments)    Unspecified    Macrobid [Nitrofurantoin]     Other reaction(s): hearing loss in R ear   Meclizine Other (See Comments)    Unspecified    Sulfa Antibiotics Other (See Comments)    Unspecified     Review of Systems: Negative except as noted in the HPI.  Objective: No changes noted in today's physical examination. There were no vitals filed for this visit. Pamela [name] is a pleasant 78 y.o. female in NAD. AAO x 3.  Vascular Examination: Capillary refill time immediate b/l. Vascular status intact b/l with palpable pedal pulses. Pedal hair present b/l. No edema. No pain with calf compression b/l. Skin temperature gradient WNL b/l. No cyanosis or clubbing noted b/l LE.   Raised venous nodule, freely movable noted dorsal aspect right great toe along EHL tendon. Has blue hue, is nonpulsatile and asymptomatic.  Neurological Examination: Pt has subjective symptoms of neuropathy. Sensation grossly intact b/l with 10 gram monofilament. Vibratory sensation intact b/l.   Dermatological Examination: Pedal skin with normal turgor, texture and tone b/l.  No open wounds. No interdigital macerations.   Toenails 1-5 b/l thick, discolored, elongated with subungual debris and pain  on dorsal palpation.   No hyperkeratotic nor porokeratotic lesions present on today's visit.  Musculoskeletal Examination: Muscle strength 5/5 to all lower extremity muscle groups bilaterally. No pain, crepitus or joint limitation noted with ROM bilateral LE. HAV with bunion deformity noted b/l LE. Pes planus deformity noted bilateral LE. Utilizes cane for ambulation assistance.  Radiographs: None  Assessment/Plan: 1. Pain due to onychomycosis of toenails of both feet   2. Polyneuropathy due to type 2 diabetes mellitus [facility])     Patient was evaluated and treated. All patient's and/or POA's questions/concerns addressed on today's visit. Toenails 1-5 debrided in length and girth without incident. Continue foot and shoe inspections daily. Monitor blood glucose per PCP/Endocrinologist's recommendations. Continue soft, supportive shoe gear daily. Report any pedal injuries to medical professional. Call office if there are any questions/concerns. -Patient/POA to call should there be question/concern in the interim.   Return in about 3 months (around 08/29/2024).  Pamela [name], DPM      [place] LOCATION: [address], [place] [address]                   Office [phone]   [facility] LOCATION: [address] [place], [place] [address] Office [phone]

## 2024-06-12 ENCOUNTER — Encounter: Payer: Self-pay | Admitting: Neurology

## 2024-06-12 ENCOUNTER — Ambulatory Visit: Payer: Medicare Other | Admitting: Neurology

## 2024-06-12 VITALS — BP 135/60 | HR 65 | Ht 68.0 in | Wt 175.0 lb

## 2024-06-12 DIAGNOSIS — R413 Other amnesia: Secondary | ICD-10-CM | POA: Diagnosis not present

## 2024-06-12 DIAGNOSIS — F0150 Vascular dementia without behavioral disturbance: Secondary | ICD-10-CM | POA: Diagnosis not present

## 2024-06-12 DIAGNOSIS — E7211 Homocystinuria: Secondary | ICD-10-CM | POA: Diagnosis not present

## 2024-06-12 DIAGNOSIS — I680 Cerebral amyloid angiopathy: Secondary | ICD-10-CM | POA: Diagnosis not present

## 2024-06-12 MED ORDER — MEMANTINE HCL 10 MG PO TABS: 10.0000 mg | ORAL_TABLET | Freq: Two times a day (BID) | ORAL | 1 refills | Status: AC

## 2024-06-12 NOTE — Progress Notes (Signed)
 [facility] [address] [place]. [place] [address] [phone]       OFFICE FOLLOW-UP VISIT NOTE  [name] Date of Birth:  02-07-1946 Medical Record Number:  [id number]   Referring MD: Elspeth Ore  Reason for Referral: Memory loss  HPI: Initial visit 10/03/2023 Pamela [name] is a pleasant 78 year old Caucasian lady seen today for initial office consultation visit for memory loss.  She is accompanied by her husband and son.  History is obtained from them and review of electronic medical records.  I have personally reviewed pertinent available imaging films in PACS.  She has past medical history of hypertension, hyperlipidemia, hypothyroidism, diverticulosis, gallstones, arthritis.  Family has noticed for the last year or so particularly in the last few months she has become forgetful.  Her short-term memory is poor though she can remember remote things quite well.  She often repeats questions and needs to be told several times about her activities for that day.  She is still mostly independent in and manages all her activities except she needs a little help the stays in the bathroom because she slipped and fell in October and fractured her right thumb and her right hand is in a cast.  She does get irritable and agitated easily but can be redirected.  She is ambulating with a cane mostly but now has started using a wheelchair for long distances because of fear of falling.  There is no known history of strokes, TIA, seizures, migraines or significant neurological problems.  She does have a family history of brain hemorrhage in both her mother and grandmother who died from them.  There is also family history of dementia.  Patient had a CT scan of the head done on 07/10/2023 when she went to the ER for a fall and with showed no significant brain abnormalities.  She has not had any cognitive workup for reversible causes of memory loss.  She is currently doing outpatient physical  therapy to improve her gait balance and fear of falling.  Review of the brain imaging studies in PACS show that she had an MRI scan of the brain without contrast on 08/17/2021 which showed innumerable chronic microhemorrhages and is mixed central and peripheral distribution raising concern for amyloid angiopathy.  There was also a small punctate area of intraparenchymal hemorrhage in the right frontal lobe.  Given her family history of intracerebral hemorrhage she and family concerned about the risk for dementia.  Patient has not had any focal neurological episodes or history of TIA or stroke so far. Update 06/12/2024: She returns for follow-up after last visit 8 months ago.  She is accompanied by her son and husband.  They both feel that she is doing slightly better.  She is tolerating Namenda  well without any side effects.  She is mostly independent in actives of daily living.  Family is scared to leave her alone because of fear of falling.  She does ambulate with a cane.  She has had no recent falls.  She has not exhibited any unsafe behavior.  There is no delusion hallucination, agitation or violent behavior.  She is still on Prevagen and family is asking if she can stop it.  Lab work on 10/03/2023 had shown normal vitamin B12, TSH and RPR.  Homocystine level was slightly elevated and patient was prescribed folic acid  1 mg daily which she is taking and repeat homocystine levels are normal on 12/13/2023.  Lipoprotein E profile was E3/E3 not high risk for  Alzheimer's EEG was ordered but not done.On MoCA testing today she scored 21 out of 30.  She was able to name 11 animals which can walk on forelegs and clock drawing 3/4. ROS:   14 system review of systems is positive for memory difficulties, repeating questions and fall, fracture right thumb and pain, agitation all other systems negative  PMH:  Past Medical History:  Diagnosis Date   Age-related nuclear cataract of right eye 04/15/2020   Amyloidosis (HCC)     organ limited   Arthritis    Cataract    Colon polyps    Diabetes mellitus (HCC)    Diverticulosis    Gallstones    GERD (gastroesophageal reflux disease)    Hearing loss    right ear   Heart murmur    Helicobacter pylori gastritis    Hiatal hernia    Hyperlipidemia    Hypertension    Hypothyroidism    Tachycardia    Tubular adenoma of colon    Vitreomacular adhesion of right eye 04/15/2020    Social History:  Social History   Socioeconomic History   Marital status: Married    Spouse name: Not on file   Number of children: 1   Years of education: Not on file   Highest education level: Not on file  Occupational History   Occupation: retired  Tobacco Use   Smoking status: Never   Smokeless tobacco: Never  Vaping Use   Vaping status: Never Used  Substance and Sexual Activity   Alcohol  use: No   Drug use: No   Sexual activity: Not Currently    Partners: Male    Comment: intercourse- 80, partners-1, Married- 55 yrs  Other Topics Concern   Not on file  Social History Narrative   Right handed   Wear reader glasses    Drinks 1-2 cups per day   Drinks unsweet tea daily          Lives with son Chyrl and husband Lemond      Retired    Social Drivers of Corporate investment banker Strain: Not on [employer] Insecurity: Not on file  Transportation Needs: Not on file  Physical Activity: Not on file  Stress: Not on file  Social Connections: Unknown (01/26/2022)   Received from [employer]   Social Network    Social Network: Not on file  Intimate Partner Violence: Unknown (12/18/2021)   Received from [facility]   HITS    Physically Hurt: Not on file    Insult or Talk Down To: Not on file    Threaten Physical Harm: Not on file    Scream or Curse: Not on file    Medications:   Current Outpatient Medications on File Prior to Visit  Medication Sig Dispense Refill   amLODipine  (NORVASC ) 5 MG tablet TAKE 1 TABLET DAILY 90 tablet 3   aspirin 81 MG EC tablet  325 mg.     calcium  carbonate (TUMS EX) 750 MG chewable tablet Chew 1 tablet by mouth as needed for heartburn.     carvedilol  (COREG ) 12.5 MG tablet Take 1 tablet (12.5 mg total) by mouth 2 (two) times daily with a meal. 180 tablet 2   cholecalciferol (VITAMIN D) 25 MCG (1000 UNIT) tablet 1,000 Units daily.     colestipol  (COLESTID ) 1 g tablet Take 2 tablets (2 g total) by mouth 2 (two) times daily. 360 tablet 3   dicyclomine  (BENTYL ) 20 MG tablet Take 1 tablet (20  mg total) by mouth 4 (four) times daily as needed for spasms. 120 tablet 3   empagliflozin (JARDIANCE) 10 MG TABS tablet Take 5 mg by mouth daily.     ezetimibe  (ZETIA ) 10 MG tablet Take 1 tablet (10 mg total) by mouth daily. 90 tablet 2   famotidine  (PEPCID ) 20 MG tablet Take 20 mg by mouth daily as needed for heartburn or indigestion.     ferrous sulfate  324 (65 Fe) MG TBEC Take 1 tablet (325 mg total) by mouth every other day. 30 tablet    folic acid  (FOLVITE ) 1 MG tablet Take 1 tablet (1 mg total) by mouth daily. 90 tablet 1   hydrochlorothiazide  (HYDRODIURIL ) 12.5 MG tablet Take 1 tablet (12.5 mg total) by mouth daily. 45 tablet 3   levothyroxine  (SYNTHROID , LEVOTHROID) 137 MCG tablet Take 137 mcg by mouth daily before breakfast. One daily, 1 and 1/2 on Sunday     linaGLIPtin-metFORMIN HCl ER 2.01-999 MG TB24 Take 2 tablets by mouth every morning.      lisinopril  (ZESTRIL ) 40 MG tablet TAKE 1 TABLET DAILY 30 tablet 0   loperamide  (IMODIUM ) 2 MG capsule Take 1 capsule (2 mg total) by mouth as needed for diarrhea or loose stools. 90 capsule 0   memantine  (NAMENDA ) 10 MG tablet Take 1 tablet (10 mg total) by mouth 2 (two) times daily. 60 tablet 2   memantine  (NAMENDA ) 5 MG tablet Take 1 tab (5 mg) once daily for 1 week, then take 1 tab (5 mg) twice daily for 1 week, then take 1 tab (5 mg) in AM and 2 tabs (10 mg) in PM for 1 week, then will start 10 mg tabs twice daily. 42 tablet 0   metroNIDAZOLE  (METROCREAM ) 0.75 % cream Apply 1  application  topically daily as needed (rosacea).     mirtazapine  (REMERON ) 15 MG tablet Take 1 tablet (15 mg total) by mouth at bedtime. For anxiety/abdominal pain 90 tablet 3   pantoprazole  (PROTONIX ) 40 MG tablet Take 1 tablet (40 mg total) by mouth 2 (two) times daily. 180 tablet 0   senna (SENOKOT) 8.6 MG tablet Take 1 tablet by mouth daily.     Coenzyme Q10 (CO Q 10) 100 MG CAPS Take 300 mg by mouth daily.     No current facility-administered medications on file prior to visit.    Allergies:   Allergies  Allergen Reactions   Demerol [Meperidine] Other (See Comments)    Unspecified    Macrobid [Nitrofurantoin Macrocrystal] Other (See Comments)    Unspecified    Macrobid [Nitrofurantoin]     Other reaction(s): hearing loss in R ear   Meclizine Other (See Comments)    Unspecified    Sulfa Antibiotics Other (See Comments)    Unspecified     Physical Exam General: well developed, well nourished pleasant elderly Caucasian lady, seated, in no evident distress Head: head normocephalic and atraumatic.   Neck: supple with no carotid or supraclavicular bruits Cardiovascular: regular rate and rhythm, no murmurs Musculoskeletal: no deformity Skin:  no rash/petichiae Vascular:  Normal pulses all extremities  Neurologic Exam Mental Status: Awake and fully alert. Oriented to place and time. Recent and remote memory intact. Attention span, concentration and fund of knowledge appropriate. Mood and affect appropriate.  Diminished recall 1/3.  Able to name 101animals which can walk on 4 legs.  Clock drawing 3/4.  On MoCA she scored 21 out of  30 Cranial Nerves: Fundoscopic exam not done.. Pupils equal, briskly reactive to  light. Extraocular movements full without nystagmus. Visual fields full to confrontation. Hearing intact. Facial sensation intact. Face, tongue, palate moves normally and symmetrically.  Motor: Normal bulk and tone. Normal strength in all tested extremity  muscles. Sensory.: intact to touch , pinprick , position and vibratory sensation.  Coordination: Rapid alternating movements normal in all extremities. Finger-to-nose and heel-to-shin performed accurately bilaterally. Gait and Station: Arises from chair without difficulty. Stance is stooped.  Uses a gait for ambulation. Gait demonstrates normal stride length and balance .  Unable to perform tandem walking.  Reflexes: 1+ and symmetric. Toes downgoing.        06/12/2024    3:34 PM 10/03/2023   11:36 AM  Montreal Cognitive Assessment   Visuospatial/ Executive (0/5) 2 0  Naming (0/3) 3 2  Attention: Read list of digits (0/2) 2 2  Attention: Read list of letters (0/1) 1 1  Attention: Serial 7 subtraction starting at 100 (0/3) 3 2  Language: Repeat phrase (0/2) 1 2  Language : Fluency (0/1) 1 0  Abstraction (0/2) 1 2  Delayed Recall (0/5) 1 1  Orientation (0/6) 6 6  Total 21 18    ASSESSMENT: 78 year old Caucasian lady with 1 year history of memory loss and cognitive difficulties likely due to mild vascular dementia.  Prior brain MRI in 2022 has shown extensive changes of cerebral microhemorrhages raising concern for amyloid angiopathy.  She has strong family history of dementia and intracerebral hemorrhage likely from familial cerebral amyloid angiopathy.      PLAN:I long discussion with the patient and her husband and son regarding her memory loss and mild vascular dementia likely from underlying cerebral amyloid uropathy.  I discussed the results of lab work, imaging studies and answered questions.  I recommend she continue Namenda  10 mg twice daily as well as folic acid  for elevated.  Likely I encouraged her to increase participation in cognitively challenging activities like solving crossword puzzles, playing bridge and sudoku.  We also discussed memory compensation strategies.   Continue aspirin for stroke prevention and maintain aggressive risk factor modification with strict control of  hypertension with blood pressure goal below 130/90, lipids with LDL cholesterol goal below 70 mg percent and diabetes with hemoglobin A1c goal below 6.5%.  I also encouraged her to increase her healthy diet with lots of fruits, vegetables, cereals and whole grains.  She was advised to use a cane or walker when walking also.  We also discussed fall prevention precautions.  She will return for follow-up in the future in 6 months with my nurse practitioner or call earlier if necessary.   I personally spent a total of 50 minutes in the care of the patient today including getting/reviewing separately obtained history, performing a medically appropriate exam/evaluation, counseling and educating, placing orders, referring and communicating with other health care professionals, documenting clinical information in the EHR, independently interpreting results, and coordinating care.        Eather Popp, MD Note: This document was prepared with digital dictation and possible smart phrase technology. Any transcriptional errors that result from this process are unintentional.

## 2024-06-12 NOTE — Patient Instructions (Addendum)
 I long discussion with the patient and her husband and son regarding her memory loss and mild vascular dementia likely from underlying cerebral amyloid uropathy.  I discussed the results of lab work, imaging studies and answered questions.  I recommend she continue Namenda  10 mg twice daily as well as folic acid  for elevated.  Likely I encouraged her to increase participation in cognitively challenging activities like solving crossword puzzles, playing bridge and sudoku.  We also discussed memory compensation strategies.   Continue aspirin for stroke prevention and maintain aggressive risk factor modification with strict control of hypertension with blood pressure goal below 130/90, lipids with LDL cholesterol goal below 70 mg percent and diabetes with hemoglobin A1c goal below 6.5%.  I also encouraged her to increase her healthy diet with lots of fruits, vegetables, cereals and whole grains.  She was advised to use a cane or walker when walking also.  We also discussed fall prevention precautions.  She will return for follow-up in the future in 6 months with my nurse practitioner or call earlier if necessary.  Dementia Caregiver Guide Dementia is a condition that affects the way the brain works. It often affects thinking and memory. A person with dementia may: Forget things. Have trouble talking or responding to your questions. Have trouble paying attention. Have trouble thinking clearly and making good decisions. Get lost or wander away from home or other places. Have big changes in their mood or emotions. They may: Feel very worried, nervous, or depressed. Have angry outbursts. Be suspicious or accuse you of things. Have childlike behavior and language. Taking care of someone with dementia can be a challenge. The tips below can help you care for the person. How to help manage lifestyle changes Dementia usually gets worse slowly over time. In the early stages, people with dementia can stay safe and  take care of themselves with some help. In later stages, they need help with daily tasks like getting dressed, grooming, and going to the bathroom. Communicating When the person is talking and seems frustrated, make eye contact and hold the person's hand. Ask questions that can be answered with a yes or no. Use simple words and a calm voice. Only give one direction at a time. Limit choices for the person. Too many choices can be stressful. Avoid correcting the person in a negative way. If the person can't find the right words, gently try to help. Preventing injury  Keep floors clear. Remove rugs, magazine racks, and floor lamps. Keep hallways well lit, especially at night. Put a handrail and nonslip mat in the bathtub or shower. Put childproof locks on cabinets that have dangerous items in them. These items include medicine, alcohol , guns, cleaning products, and sharp tools. For doors to the outside, put locks where the person can't see or reach them. This helps keep the person from going out of the house and getting lost. Be ready for emergencies. Keep a list of emergency phone numbers and addresses close by. Remove car keys and lock garage doors so the person doesn't try to drive. Have the person wear a bracelet that tracks where they are and shows that they're a person with memory loss. This should be worn at all times for safety. Helping with daily life  Keep the person on track with their daily routine. Try to identify areas where the person may need help. Be supportive, patient, calm, and encouraging. Gently remind the person that adjusting to changes takes time. Help with the tasks that  the person has asked for help with. Keep the person involved in daily tasks and decisions as much as you can. Encourage conversation, but try not to get frustrated if the person struggles to find words or doesn't seem to appreciate your help. Other tips Think about any safety risks and take steps to  avoid them. Keep things organized: Organize medicines in a pill box for each day of the week. Keep a calendar in a central place. Use it to remind the person of health care visits or other activities. Create a plan to handle any legal or financial matters. Get help from a professional if needed. Help make sure the person: Takes medicines only as told by their health care providers. Eats regular, healthy meals. They should also drink plenty of fluids. Goes to all scheduled health care appointments. Gets regular sleep. Taking care of yourself Being a caregiver for someone with dementia can be hard. You may feel stressed and have many other emotions. It's important to also take care of yourself. Here are some tips: Find out about services that can provide short-term care for the person. This is called respite care. It can allow you to take a break when you need one. Find healthy ways to deal with stress. Some ways include: Spending time with other people. Exercising. Meditating or doing deep breathing exercises. Take care of your own health by: Getting enough sleep. Eating healthy foods. Getting regular exercise. Join a support group with others who are caregivers. These groups can help you: Learn other ways to deal with stress. Share experiences with others. Get emotional comfort and support. Learn about caregiving as the disease gets worse. Find resources in your community. Where to find support: Many people and organizations offer support. These include: Support groups for people with dementia. Support groups for caregivers. Counselors or therapists. Home health care services. Adult day care centers. Where to find more information Alzheimer's Association: WesternTunes.it Family Caregiver Alliance: caregiver.org Alzheimer's Foundation of [place]: alzfdn.org Contact a health care provider if: The person's health is quickly getting worse. You're no longer able to care for the  person. Caring for the person is affecting your physical and emotional health. You're feeling worried, nervous, or depressed about caring for the person. Get help right away if: You feel like the person may hurt themselves or others. The person has talked about taking their own life. These symptoms may be an emergency. Take one of these steps right away: Go to your nearest emergency room. Call 911. Call the National Suicide Prevention Lifeline at 928-838-8986 or 988. Text the Crisis Text Line at 872 697 6360. This information is not intended to replace advice given to you by your health care provider. Make sure you discuss any questions you have with your health care provider. Document Revised: 12/10/2022 Document Reviewed: 12/10/2022 Elsevier Patient Education  2024 [employer].

## 2024-06-15 DIAGNOSIS — D485 Neoplasm of uncertain behavior of skin: Secondary | ICD-10-CM | POA: Diagnosis not present

## 2024-06-15 DIAGNOSIS — D1724 Benign lipomatous neoplasm of skin and subcutaneous tissue of left leg: Secondary | ICD-10-CM | POA: Diagnosis not present

## 2024-06-26 ENCOUNTER — Ambulatory Visit: Admitting: Physician Assistant

## 2024-07-03 DIAGNOSIS — L0291 Cutaneous abscess, unspecified: Secondary | ICD-10-CM | POA: Diagnosis not present

## 2024-07-13 DIAGNOSIS — L0291 Cutaneous abscess, unspecified: Secondary | ICD-10-CM | POA: Diagnosis not present

## 2024-07-23 DIAGNOSIS — L02416 Cutaneous abscess of left lower limb: Secondary | ICD-10-CM | POA: Diagnosis not present

## 2024-08-01 DIAGNOSIS — L02416 Cutaneous abscess of left lower limb: Secondary | ICD-10-CM | POA: Diagnosis not present

## 2024-08-08 ENCOUNTER — Telehealth (HOSPITAL_BASED_OUTPATIENT_CLINIC_OR_DEPARTMENT_OTHER): Payer: Self-pay | Admitting: Cardiology

## 2024-08-08 NOTE — Telephone Encounter (Signed)
*  STAT* If patient is at the pharmacy, call can be transferred to refill team.   1. Which medications need to be refilled? (please list name of each medication and dose if known)   ezetimibe  (ZETIA ) 10 MG tablet  hydrochlorothiazide  (HYDRODIURIL ) 12.5 MG tablet   2. Would you like to learn more about the convenience, safety, & potential cost savings by using the [facility]?   3. Are you open to using the [facility] (Type [facility]. ).  4. Which pharmacy/location (including street and city if local pharmacy) is medication to be sent to?  [id number] - [place], [place] - [address] AT [facility] OF [address]   5. Do they need a 30 day or 90 day supply?   90 day  Son Bronwen) stated patient still has medication left.  Patient has appointment scheduled on 12/19 with Dr. Lonni.

## 2024-08-13 ENCOUNTER — Ambulatory Visit: Admitting: Physician Assistant

## 2024-08-13 ENCOUNTER — Encounter: Payer: Self-pay | Admitting: Physician Assistant

## 2024-08-13 VITALS — BP 128/62 | HR 76 | Ht 62.5 in | Wt 180.5 lb

## 2024-08-13 DIAGNOSIS — K529 Noninfective gastroenteritis and colitis, unspecified: Secondary | ICD-10-CM

## 2024-08-13 DIAGNOSIS — R194 Change in bowel habit: Secondary | ICD-10-CM | POA: Diagnosis not present

## 2024-08-13 DIAGNOSIS — K219 Gastro-esophageal reflux disease without esophagitis: Secondary | ICD-10-CM

## 2024-08-13 DIAGNOSIS — D509 Iron deficiency anemia, unspecified: Secondary | ICD-10-CM | POA: Diagnosis not present

## 2024-08-13 DIAGNOSIS — E119 Type 2 diabetes mellitus without complications: Secondary | ICD-10-CM

## 2024-08-13 DIAGNOSIS — R1032 Left lower quadrant pain: Secondary | ICD-10-CM | POA: Diagnosis not present

## 2024-08-13 MED ORDER — HYDROCHLOROTHIAZIDE 12.5 MG PO TABS: 12.5000 mg | ORAL_TABLET | Freq: Every day | ORAL | 1 refills | Status: DC

## 2024-08-13 MED ORDER — EZETIMIBE 10 MG PO TABS: 10.0000 mg | ORAL_TABLET | Freq: Every day | ORAL | 0 refills | Status: DC
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
## 2024-08-13 NOTE — Patient Instructions (Addendum)
 Can do the colestipol  up to 4 capsules total a day Can do twice a day colestipol  WHILE you add on the fiber supplement BUT if she continue to have accidents can increase to 3 tablets (especially if she is eating fatty foods)  Please continue the benfiber over the counter 1 TBSP daily, can increase to 2 TBSP daily if needed to help the stools be formed Can do with coffee, tea, water, etc.   Can do lubricant suppositories or dulcolax suppository AS needed for rare times she has constipation   VISIT SUMMARY:  Today, we discussed your bowel incontinence and constipation management. We reviewed your current medications and made some adjustments to help manage your symptoms more effectively.  YOUR PLAN:  BILE ACID DIARRHEA AND FECAL INCONTINENCE AFTER CHOLECYSTECTOMY: You are experiencing increased bowel accidents likely due to bile acid diarrhea after your gallbladder removal. Constipation occurs with higher doses of colestipol , and the absence of Benefiber may be contributing to the increased accidents. -Continue taking colestipol  at 2 tablets daily with Benefiber. -Increase colestipol  to 3 tablets daily if accidents persist or when eating fatty foods. -Restart Benefiber at 1 tablespoon daily, and increase to 2 tablespoons if needed. -Consider taking an extra colestipol  tablet on days when you eat fatty foods or if accidents occur.  INTERMITTENT CONSTIPATION: You experience occasional constipation, which is made worse by higher doses of colestipol . -Use Dulcolax or a lubricant suppository as needed for constipation relief.  INTERMITTENT ABDOMINAL PAIN: You have abdominal pain that you manage with dicyclomine , which you take 3-4 times a week for severe pain. -Continue taking dicyclomine  as needed for severe pain.  Please follow up in 6 months.  Thank you for entrusting me with your care and for choosing [facility], Alan Coombs, P.A.-C

## 2024-08-14 ENCOUNTER — Other Ambulatory Visit: Payer: Self-pay | Admitting: Neurology

## 2024-08-15 DIAGNOSIS — Z4889 Encounter for other specified surgical aftercare: Secondary | ICD-10-CM | POA: Diagnosis not present

## 2024-08-16 NOTE — Telephone Encounter (Signed)
 Last seen on 06/12/24 Follow up scheduled on 01/17/25

## 2024-08-29 ENCOUNTER — Encounter: Payer: Self-pay | Admitting: Podiatry

## 2024-08-29 ENCOUNTER — Ambulatory Visit: Admitting: Podiatry

## 2024-08-29 DIAGNOSIS — M79674 Pain in right toe(s): Secondary | ICD-10-CM | POA: Diagnosis not present

## 2024-08-29 DIAGNOSIS — B351 Tinea unguium: Secondary | ICD-10-CM

## 2024-08-29 DIAGNOSIS — M79675 Pain in left toe(s): Secondary | ICD-10-CM

## 2024-08-29 DIAGNOSIS — E1142 Type 2 diabetes mellitus with diabetic polyneuropathy: Secondary | ICD-10-CM | POA: Diagnosis not present

## 2024-08-30 ENCOUNTER — Other Ambulatory Visit: Payer: Self-pay | Admitting: Physician Assistant

## 2024-08-31 ENCOUNTER — Encounter (HOSPITAL_BASED_OUTPATIENT_CLINIC_OR_DEPARTMENT_OTHER): Payer: Self-pay | Admitting: Cardiology

## 2024-08-31 ENCOUNTER — Ambulatory Visit (INDEPENDENT_AMBULATORY_CARE_PROVIDER_SITE_OTHER): Admitting: Cardiology

## 2024-08-31 VITALS — BP 122/78 | HR 66 | Ht 62.5 in | Wt 184.4 lb

## 2024-08-31 DIAGNOSIS — E785 Hyperlipidemia, unspecified: Secondary | ICD-10-CM

## 2024-08-31 DIAGNOSIS — I680 Cerebral amyloid angiopathy: Secondary | ICD-10-CM

## 2024-08-31 DIAGNOSIS — E119 Type 2 diabetes mellitus without complications: Secondary | ICD-10-CM | POA: Diagnosis not present

## 2024-08-31 DIAGNOSIS — R413 Other amnesia: Secondary | ICD-10-CM

## 2024-08-31 DIAGNOSIS — I10 Essential (primary) hypertension: Secondary | ICD-10-CM

## 2024-08-31 MED ORDER — EZETIMIBE 10 MG PO TABS: 10.0000 mg | ORAL_TABLET | Freq: Every day | ORAL | 3 refills | Status: AC

## 2024-08-31 MED ORDER — LISINOPRIL 40 MG PO TABS: 40.0000 mg | ORAL_TABLET | Freq: Every day | ORAL | 3 refills | Status: AC

## 2024-08-31 MED ORDER — HYDROCHLOROTHIAZIDE 12.5 MG PO TABS: 12.5000 mg | ORAL_TABLET | Freq: Every day | ORAL | 3 refills | Status: AC

## 2024-08-31 NOTE — Progress Notes (Signed)
" °  Cardiology Office Note:  .   Date:  08/31/2024  ID:  Pamela [name], DOB [birth year], MRN [id number] PCP: Nichole Senior, MD  [facility] Providers Cardiologist:  Shelda Bruckner, MD {  History of Present Illness: .   Pamela [name] is a 78 y.o. female with PMH hypertension, type II diabetes, hyperlipidemia. She was previously followed by Dr. Burnard and established care wih me on 08/31/24.   Pertinent CV history: Echo 08/2021 with EF 60-65%, normal strain, normal diastology, no significant valve disease. Cath in 2010 without CAD. Myoview 2014 without ischemia, had mild breast attenuation.  Followed by Dr. Rosemarie for memory, concern for cerebral amyloid, microhemorrhages on MRI.   Today: Last seen by Dr Burnard 01/03/24, was hypotensive at that time, hydrochlorothiazide  dose decreased.   Here with her son today. No concerns about her heart today. ROS negative. Reviewed her history, recommendations from cardiac perspective.  ROS: Denies chest pain, shortness of breath at rest or with normal exertion. No PND, orthopnea, LE edema or unexpected weight gain. No syncope or palpitations. ROS otherwise negative except as noted.   Studies Reviewed: SABRA    EKG:  EKG Interpretation Date/Time:  Friday August 31 2024 16:27:32 EST Ventricular Rate:  66 PR Interval:  146 QRS Duration:  66 QT Interval:  410 QTC Calculation: 429 R Axis:   3  Text Interpretation: Normal sinus rhythm Low voltage QRS When compared with ECG of 03-Jan-2024 14:29, No significant change was found Confirmed by Bruckner Shelda [phone]) on 08/31/2024 4:47:12 PM    Physical Exam:   VS:  BP 122/78 (BP Location: Right Arm, Patient Position: Sitting, Cuff Size: Normal)   Pulse 66   Ht 5' 2.5 (1.588 m)   Wt 184 lb 6.4 oz (83.6 kg)   SpO2 98%   BMI 33.19 kg/m    Wt Readings from Last 3 Encounters:  08/31/24 184 lb 6.4 oz (83.6 kg)  08/13/24 180 lb 8 oz (81.9 kg)  06/12/24 175 lb (79.4 kg)    GEN: Well  nourished, well developed in no acute distress HEENT: Normal, moist mucous membranes NECK: No JVD CARDIAC: regular rhythm, normal S1 and S2, no rubs or gallops. No murmur. VASCULAR: Radial and DP pulses 2+ bilaterally. No carotid bruits RESPIRATORY:  Clear to auscultation without rales, wheezing or rhonchi  ABDOMEN: Soft, non-tender, non-distended MUSCULOSKELETAL:  Ambulates independently SKIN: Warm and dry, no edema NEUROLOGIC:  Alert and oriented x 3. No focal neuro deficits noted. PSYCHIATRIC:  Normal affect    ASSESSMENT AND PLAN: .    Hypertension -well controlled, continue amlodipine  5 mg daily, carvedilol  12.5 mg BID, hydrochlorothiazide  12.5 mg daily, lisinopril  40 mg daily  Hyperlipidemia Type II diabetes -on aspirin, ezetimibe   Cerebral amyloid, memory impairment -on aspirin per neurology recommendations   Dispo: 1 year or sooner as needed, per patient preference (offered PRN)  Signed, Shelda Bruckner, MD   Shelda Bruckner, MD, PhD, [facility]  [facility]  [place]  [facility] at [facility] [address], [address] [place], [place] [address] [phone]   "

## 2024-08-31 NOTE — Patient Instructions (Signed)

## 2024-09-02 NOTE — Progress Notes (Signed)
 "  Subjective:  Patient ID: Pamela [name], female    DOB: 1946-07-29,  MRN: [id number]  Pamela [name] presents to clinic today for at risk foot care with history of diabetic neuropathy and painful mycotic toenails of both feet that are difficult to trim. Pain interferes with daily activities and wearing enclosed shoe gear comfortably. She is accompanied by her son on today's visit. Chief Complaint  Patient presents with   [facility]    [facility] A1c 6.2, Dec 3,2025 Nichole Senior, MD (PCP, 01/02/24    New problem(s): None.   PCP is [place], Senior, MD.  Allergies[1]  Review of Systems: Negative except as noted in the HPI.  Objective: No changes noted in today's physical examination. There were no vitals filed for this visit. Pamela [name] is a pleasant 78 y.o. female in NAD. AAO x 3.  Vascular Examination: Capillary refill time immediate b/l. Vascular status intact b/l with palpable pedal pulses. Pedal hair present b/l. No edema. No pain with calf compression b/l. Skin temperature gradient WNL b/l. No cyanosis or clubbing noted b/l LE.   Raised venous nodule, freely movable noted dorsal aspect right great toe along EHL tendon. Has blue hue, is nonpulsatile and asymptomatic.  Neurological Examination: Pt has subjective symptoms of neuropathy. Sensation grossly intact b/l with 10 gram monofilament. Vibratory sensation intact b/l.   Dermatological Examination: Pedal skin with normal turgor, texture and tone b/l.  No open wounds. No interdigital macerations.   Toenails 1-5 b/l thick, discolored, elongated with subungual debris and pain on dorsal palpation.   No hyperkeratotic nor porokeratotic lesions present on today's visit.  Musculoskeletal Examination: Muscle strength 5/5 to all lower extremity muscle groups bilaterally. No pain, crepitus or joint limitation noted with ROM bilateral LE. HAV with bunion deformity noted b/l LE. Pes planus deformity noted bilateral LE. Utilizes cane for  ambulation assistance.  Radiographs: None  Assessment/Plan: 1. Pain due to onychomycosis of toenails of both feet   2. Polyneuropathy due to type 2 diabetes mellitus [facility])    Consent given for treatment. Patient examined. All patient's and/or POA's questions/concerns addressed on today's visit. Mycotic toenails 1-5 b/l debrided in length and girth without incident. Continue foot and shoe inspections daily. Monitor blood glucose per PCP/Endocrinologist's recommendations.Continue soft, supportive shoe gear daily. Report any pedal injuries to medical professional. Call office if there are any quesitons/concerns. -Patient/POA to call should there be question/concern in the interim.   Return in about 3 months (around 11/27/2024).  Delon LITTIE Merlin, DPM       LOCATION: [address], [place] [address]                   Office [phone]   [place] LOCATION: [address] [place], [place] [address] Office [phone]     [1]  Allergies Allergen Reactions   Demerol [Meperidine] Other (See Comments)    Unspecified    Macrobid [Nitrofurantoin Macrocrystal] Other (See Comments)    Unspecified    Macrobid [Nitrofurantoin]     Other reaction(s): hearing loss in R ear   Meclizine Other (See Comments)    Unspecified    Meclizine Hcl     Other Reaction(s): Unknown  Sulfa Antibiotics Other (See Comments)    Unspecified   Sulfamethoxazole-Trimethoprim     Other Reaction(s): Unknown   Sulfur     Other Reaction(s): Unknown   "

## 2024-09-17 ENCOUNTER — Telehealth: Payer: Self-pay | Admitting: Physician Assistant

## 2024-09-17 MED ORDER — DICYCLOMINE HCL 20 MG PO TABS: 20.0000 mg | ORAL_TABLET | Freq: Four times a day (QID) | ORAL | 2 refills | Status: AC | PRN

## 2024-09-17 NOTE — Telephone Encounter (Signed)
 Sent in the same script if she uses it 3-4 times a week as the last ov says she she would get less so sent 120 tablets with 2 refills in for now

## 2024-09-17 NOTE — Telephone Encounter (Signed)
 PT needs a 90 day refill for dicyclomine  sent to [facility] on [address]. Please advise.

## 2024-11-28 ENCOUNTER — Ambulatory Visit: Admitting: Podiatry

## 2025-01-17 ENCOUNTER — Ambulatory Visit: Admitting: Neurology

## 2025-02-27 ENCOUNTER — Ambulatory Visit: Admitting: Podiatry

## 2025-06-12 ENCOUNTER — Ambulatory Visit: Admitting: Neurology
